# Patient Record
Sex: Female | Born: 1955
Health system: Southern US, Community
[De-identification: ages and names within clinical notes are randomized; demographics above are authoritative.]

## PROBLEM LIST (undated history)

## (undated) DIAGNOSIS — F319 Bipolar disorder, unspecified: Secondary | ICD-10-CM

## (undated) DIAGNOSIS — J449 Chronic obstructive pulmonary disease, unspecified: Secondary | ICD-10-CM

## (undated) DIAGNOSIS — D649 Anemia, unspecified: Secondary | ICD-10-CM

## (undated) DIAGNOSIS — K222 Esophageal obstruction: Secondary | ICD-10-CM

## (undated) DIAGNOSIS — K449 Diaphragmatic hernia without obstruction or gangrene: Secondary | ICD-10-CM

## (undated) DIAGNOSIS — Z9889 Other specified postprocedural states: Secondary | ICD-10-CM

## (undated) HISTORY — DX: Diaphragmatic hernia without obstruction or gangrene: K44.9

## (undated) HISTORY — DX: Anemia, unspecified: D64.9

## (undated) HISTORY — DX: Esophageal obstruction: K22.2

## (undated) HISTORY — DX: Chronic obstructive pulmonary disease, unspecified: J44.9

## (undated) HISTORY — DX: Bipolar disorder, unspecified: F31.9

## (undated) HISTORY — PX: TUBAL LIGATION: SHX77

## (undated) HISTORY — DX: Other specified postprocedural states: Z98.890

---

## 2000-11-16 DIAGNOSIS — Z9889 Other specified postprocedural states: Secondary | ICD-10-CM

## 2000-11-16 HISTORY — DX: Other specified postprocedural states: Z98.890

## 2005-01-14 HISTORY — PX: TOTAL ABDOMINAL HYSTERECTOMY W/ BILATERAL SALPINGOOPHORECTOMY: SHX83

## 2005-02-03 ENCOUNTER — Emergency Department (HOSPITAL_COMMUNITY): Admission: EM | Admit: 2005-02-03 | Discharge: 2005-02-03 | Payer: Self-pay | Admitting: Emergency Medicine

## 2005-04-09 ENCOUNTER — Emergency Department (HOSPITAL_COMMUNITY): Admission: EM | Admit: 2005-04-09 | Discharge: 2005-04-10 | Payer: Self-pay | Admitting: *Deleted

## 2006-03-12 ENCOUNTER — Ambulatory Visit: Payer: Self-pay | Admitting: Internal Medicine

## 2006-03-31 ENCOUNTER — Ambulatory Visit: Payer: Self-pay | Admitting: Family Medicine

## 2006-04-14 ENCOUNTER — Ambulatory Visit: Payer: Self-pay | Admitting: Internal Medicine

## 2006-04-14 ENCOUNTER — Ambulatory Visit (HOSPITAL_COMMUNITY): Admission: RE | Admit: 2006-04-14 | Discharge: 2006-04-14 | Payer: Self-pay | Admitting: Internal Medicine

## 2006-04-15 ENCOUNTER — Ambulatory Visit: Payer: Self-pay | Admitting: Internal Medicine

## 2006-04-27 ENCOUNTER — Ambulatory Visit: Payer: Self-pay | Admitting: Family Medicine

## 2006-04-27 ENCOUNTER — Encounter (INDEPENDENT_AMBULATORY_CARE_PROVIDER_SITE_OTHER): Payer: Self-pay | Admitting: Internal Medicine

## 2006-04-28 ENCOUNTER — Ambulatory Visit (HOSPITAL_COMMUNITY): Admission: RE | Admit: 2006-04-28 | Discharge: 2006-04-28 | Payer: Self-pay | Admitting: Internal Medicine

## 2006-04-28 ENCOUNTER — Encounter (INDEPENDENT_AMBULATORY_CARE_PROVIDER_SITE_OTHER): Payer: Self-pay | Admitting: Internal Medicine

## 2006-04-29 ENCOUNTER — Ambulatory Visit: Payer: Self-pay | Admitting: Internal Medicine

## 2006-05-12 ENCOUNTER — Encounter (INDEPENDENT_AMBULATORY_CARE_PROVIDER_SITE_OTHER): Payer: Self-pay | Admitting: Internal Medicine

## 2006-05-25 ENCOUNTER — Ambulatory Visit: Payer: Self-pay | Admitting: Internal Medicine

## 2006-06-01 ENCOUNTER — Ambulatory Visit: Payer: Self-pay | Admitting: Internal Medicine

## 2006-06-29 ENCOUNTER — Ambulatory Visit: Payer: Self-pay | Admitting: Internal Medicine

## 2006-07-21 ENCOUNTER — Encounter (INDEPENDENT_AMBULATORY_CARE_PROVIDER_SITE_OTHER): Payer: Self-pay | Admitting: Internal Medicine

## 2006-08-09 ENCOUNTER — Encounter (INDEPENDENT_AMBULATORY_CARE_PROVIDER_SITE_OTHER): Payer: Self-pay | Admitting: Internal Medicine

## 2006-08-29 ENCOUNTER — Emergency Department (HOSPITAL_COMMUNITY): Admission: EM | Admit: 2006-08-29 | Discharge: 2006-08-29 | Payer: Self-pay | Admitting: *Deleted

## 2006-09-22 ENCOUNTER — Encounter: Payer: Self-pay | Admitting: Family Medicine

## 2006-12-21 ENCOUNTER — Encounter: Payer: Self-pay | Admitting: Internal Medicine

## 2006-12-21 DIAGNOSIS — K449 Diaphragmatic hernia without obstruction or gangrene: Secondary | ICD-10-CM | POA: Insufficient documentation

## 2006-12-21 DIAGNOSIS — K222 Esophageal obstruction: Secondary | ICD-10-CM

## 2006-12-21 DIAGNOSIS — D649 Anemia, unspecified: Secondary | ICD-10-CM | POA: Insufficient documentation

## 2007-01-22 ENCOUNTER — Emergency Department (HOSPITAL_COMMUNITY): Admission: EM | Admit: 2007-01-22 | Discharge: 2007-01-22 | Payer: Self-pay | Admitting: Emergency Medicine

## 2007-01-24 ENCOUNTER — Ambulatory Visit: Payer: Self-pay | Admitting: Internal Medicine

## 2007-01-24 DIAGNOSIS — M940 Chondrocostal junction syndrome [Tietze]: Secondary | ICD-10-CM | POA: Insufficient documentation

## 2007-01-24 DIAGNOSIS — N6019 Diffuse cystic mastopathy of unspecified breast: Secondary | ICD-10-CM

## 2007-06-15 ENCOUNTER — Ambulatory Visit: Payer: Self-pay | Admitting: Internal Medicine

## 2007-06-15 ENCOUNTER — Ambulatory Visit (HOSPITAL_COMMUNITY): Admission: RE | Admit: 2007-06-15 | Discharge: 2007-06-15 | Payer: Self-pay | Admitting: Internal Medicine

## 2007-06-15 DIAGNOSIS — F17200 Nicotine dependence, unspecified, uncomplicated: Secondary | ICD-10-CM

## 2007-06-15 DIAGNOSIS — R109 Unspecified abdominal pain: Secondary | ICD-10-CM | POA: Insufficient documentation

## 2007-06-16 ENCOUNTER — Encounter (INDEPENDENT_AMBULATORY_CARE_PROVIDER_SITE_OTHER): Payer: Self-pay | Admitting: Internal Medicine

## 2007-06-16 ENCOUNTER — Telehealth (INDEPENDENT_AMBULATORY_CARE_PROVIDER_SITE_OTHER): Payer: Self-pay | Admitting: *Deleted

## 2007-06-17 ENCOUNTER — Encounter (INDEPENDENT_AMBULATORY_CARE_PROVIDER_SITE_OTHER): Payer: Self-pay | Admitting: Internal Medicine

## 2007-06-17 LAB — CONVERTED CEMR LAB
ALT: 17 units/L (ref 0–35)
AST: 20 units/L (ref 0–37)
Albumin: 4.1 g/dL (ref 3.5–5.2)
Alkaline Phosphatase: 53 units/L (ref 39–117)
Basophils Absolute: 0 10*3/uL (ref 0.0–0.1)
Basophils Relative: 0 % (ref 0–1)
Calcium: 9.3 mg/dL (ref 8.4–10.5)
Chloride: 104 meq/L (ref 96–112)
Eosinophils Absolute: 0.2 10*3/uL (ref 0.0–0.7)
MCHC: 32 g/dL (ref 30.0–36.0)
MCV: 96.7 fL (ref 78.0–100.0)
Neutro Abs: 2.1 10*3/uL (ref 1.7–7.7)
Neutrophils Relative %: 39 % — ABNORMAL LOW (ref 43–77)
Platelets: 183 10*3/uL (ref 150–400)
Potassium: 3.6 meq/L (ref 3.5–5.3)
RDW: 13.5 % (ref 11.5–14.0)
TSH: 2.574 microintl units/mL (ref 0.350–5.50)

## 2007-06-20 ENCOUNTER — Telehealth (INDEPENDENT_AMBULATORY_CARE_PROVIDER_SITE_OTHER): Payer: Self-pay | Admitting: *Deleted

## 2007-06-27 ENCOUNTER — Telehealth (INDEPENDENT_AMBULATORY_CARE_PROVIDER_SITE_OTHER): Payer: Self-pay | Admitting: *Deleted

## 2007-07-01 ENCOUNTER — Ambulatory Visit: Payer: Self-pay | Admitting: Gastroenterology

## 2007-07-19 ENCOUNTER — Ambulatory Visit: Payer: Self-pay | Admitting: Gastroenterology

## 2007-07-19 ENCOUNTER — Ambulatory Visit (HOSPITAL_COMMUNITY): Admission: RE | Admit: 2007-07-19 | Discharge: 2007-07-19 | Payer: Self-pay | Admitting: Gastroenterology

## 2007-07-19 HISTORY — PX: ESOPHAGOGASTRODUODENOSCOPY: SHX1529

## 2007-07-20 ENCOUNTER — Ambulatory Visit (HOSPITAL_COMMUNITY): Admission: RE | Admit: 2007-07-20 | Discharge: 2007-07-20 | Payer: Self-pay | Admitting: Gastroenterology

## 2007-09-07 ENCOUNTER — Ambulatory Visit: Payer: Self-pay | Admitting: Gastroenterology

## 2007-09-07 ENCOUNTER — Encounter (INDEPENDENT_AMBULATORY_CARE_PROVIDER_SITE_OTHER): Payer: Self-pay | Admitting: Internal Medicine

## 2007-09-27 ENCOUNTER — Encounter (INDEPENDENT_AMBULATORY_CARE_PROVIDER_SITE_OTHER): Payer: Self-pay | Admitting: Internal Medicine

## 2007-10-06 ENCOUNTER — Encounter (INDEPENDENT_AMBULATORY_CARE_PROVIDER_SITE_OTHER): Payer: Self-pay | Admitting: Internal Medicine

## 2007-10-06 LAB — HM MAMMOGRAPHY

## 2008-02-22 ENCOUNTER — Telehealth (INDEPENDENT_AMBULATORY_CARE_PROVIDER_SITE_OTHER): Payer: Self-pay | Admitting: *Deleted

## 2008-07-10 ENCOUNTER — Ambulatory Visit: Payer: Self-pay | Admitting: Internal Medicine

## 2008-07-10 DIAGNOSIS — N952 Postmenopausal atrophic vaginitis: Secondary | ICD-10-CM

## 2008-07-10 DIAGNOSIS — B070 Plantar wart: Secondary | ICD-10-CM

## 2008-07-11 LAB — CONVERTED CEMR LAB
CO2: 25 meq/L (ref 19–32)
Calcium: 9.4 mg/dL (ref 8.4–10.5)
Chloride: 103 meq/L (ref 96–112)
Creatinine, Ser: 0.93 mg/dL (ref 0.40–1.20)
Glucose, Bld: 97 mg/dL (ref 70–99)
HDL: 58 mg/dL (ref >39)
LDL Cholesterol: 88 mg/dL (ref 0–99)
Sodium: 141 meq/L (ref 135–145)
Total CHOL/HDL Ratio: 2.8

## 2008-07-26 ENCOUNTER — Encounter (INDEPENDENT_AMBULATORY_CARE_PROVIDER_SITE_OTHER): Payer: Self-pay | Admitting: Internal Medicine

## 2008-07-27 ENCOUNTER — Ambulatory Visit: Payer: Self-pay | Admitting: Internal Medicine

## 2008-07-27 DIAGNOSIS — M65839 Other synovitis and tenosynovitis, unspecified forearm: Secondary | ICD-10-CM

## 2008-07-27 DIAGNOSIS — M65849 Other synovitis and tenosynovitis, unspecified hand: Secondary | ICD-10-CM

## 2008-08-13 ENCOUNTER — Encounter: Payer: Self-pay | Admitting: Gastroenterology

## 2008-08-13 ENCOUNTER — Encounter (INDEPENDENT_AMBULATORY_CARE_PROVIDER_SITE_OTHER): Payer: Self-pay | Admitting: Internal Medicine

## 2008-08-13 ENCOUNTER — Ambulatory Visit: Payer: Self-pay | Admitting: Gastroenterology

## 2008-08-13 ENCOUNTER — Ambulatory Visit (HOSPITAL_COMMUNITY): Admission: RE | Admit: 2008-08-13 | Discharge: 2008-08-13 | Payer: Self-pay | Admitting: Gastroenterology

## 2008-08-13 HISTORY — PX: COLONOSCOPY: SHX174

## 2008-08-13 LAB — HM COLONOSCOPY

## 2008-08-17 ENCOUNTER — Ambulatory Visit: Payer: Self-pay | Admitting: Internal Medicine

## 2008-10-03 ENCOUNTER — Encounter (INDEPENDENT_AMBULATORY_CARE_PROVIDER_SITE_OTHER): Payer: Self-pay | Admitting: Internal Medicine

## 2008-11-16 DIAGNOSIS — F319 Bipolar disorder, unspecified: Secondary | ICD-10-CM

## 2008-11-16 HISTORY — DX: Bipolar disorder, unspecified: F31.9

## 2008-11-23 ENCOUNTER — Ambulatory Visit: Payer: Self-pay | Admitting: Internal Medicine

## 2008-11-23 DIAGNOSIS — M6530 Trigger finger, unspecified finger: Secondary | ICD-10-CM | POA: Insufficient documentation

## 2008-11-27 ENCOUNTER — Telehealth (INDEPENDENT_AMBULATORY_CARE_PROVIDER_SITE_OTHER): Payer: Self-pay | Admitting: *Deleted

## 2009-03-08 ENCOUNTER — Telehealth (INDEPENDENT_AMBULATORY_CARE_PROVIDER_SITE_OTHER): Payer: Self-pay | Admitting: Internal Medicine

## 2009-08-04 ENCOUNTER — Emergency Department (HOSPITAL_COMMUNITY): Admission: EM | Admit: 2009-08-04 | Discharge: 2009-08-04 | Payer: Self-pay | Admitting: Emergency Medicine

## 2009-08-06 ENCOUNTER — Encounter (INDEPENDENT_AMBULATORY_CARE_PROVIDER_SITE_OTHER): Payer: Self-pay | Admitting: Internal Medicine

## 2009-08-14 ENCOUNTER — Ambulatory Visit: Payer: Self-pay | Admitting: Family Medicine

## 2009-08-15 ENCOUNTER — Encounter: Payer: Self-pay | Admitting: Family Medicine

## 2009-08-15 ENCOUNTER — Ambulatory Visit (HOSPITAL_COMMUNITY): Admission: RE | Admit: 2009-08-15 | Discharge: 2009-08-15 | Payer: Self-pay | Admitting: Family Medicine

## 2009-08-18 DIAGNOSIS — K219 Gastro-esophageal reflux disease without esophagitis: Secondary | ICD-10-CM

## 2009-08-19 ENCOUNTER — Encounter: Payer: Self-pay | Admitting: Family Medicine

## 2009-08-19 LAB — CONVERTED CEMR LAB
CO2: 25 meq/L (ref 19–32)
Chloride: 105 meq/L (ref 96–112)
Creatinine, Ser: 0.95 mg/dL (ref 0.40–1.20)
HDL: 48 mg/dL (ref >39)
LDL Cholesterol: 96 mg/dL (ref 0–99)
Lymphocytes Relative: 44 % (ref 12–46)
Lymphs Abs: 2.1 10*3/uL (ref 0.7–4.0)
MCV: 96.8 fL (ref 78.0–100.0)
Monocytes Relative: 9 % (ref 3–12)
Neutro Abs: 2 10*3/uL (ref 1.7–7.7)
Neutrophils Relative %: 42 % — ABNORMAL LOW (ref 43–77)
Platelets: 191 10*3/uL (ref 150–400)
Potassium: 4 meq/L (ref 3.5–5.3)
RBC: 4.63 M/uL (ref 3.87–5.11)
Sodium: 142 meq/L (ref 135–145)
TSH: 2.336 microintl units/mL (ref 0.350–4.500)
Total CHOL/HDL Ratio: 3.2
Triglycerides: 50 mg/dL (ref <150)
VLDL: 10 mg/dL (ref 0–40)
WBC: 4.8 10*3/uL (ref 4.0–10.5)

## 2009-12-13 ENCOUNTER — Ambulatory Visit: Payer: Self-pay | Admitting: Family Medicine

## 2009-12-13 DIAGNOSIS — K3189 Other diseases of stomach and duodenum: Secondary | ICD-10-CM

## 2009-12-13 DIAGNOSIS — R1013 Epigastric pain: Secondary | ICD-10-CM

## 2009-12-20 ENCOUNTER — Ambulatory Visit (HOSPITAL_COMMUNITY): Admission: RE | Admit: 2009-12-20 | Discharge: 2009-12-20 | Payer: Self-pay | Admitting: Family Medicine

## 2009-12-23 ENCOUNTER — Telehealth: Payer: Self-pay | Admitting: Family Medicine

## 2009-12-26 ENCOUNTER — Encounter: Payer: Self-pay | Admitting: Family Medicine

## 2010-01-10 ENCOUNTER — Encounter (HOSPITAL_COMMUNITY): Admission: RE | Admit: 2010-01-10 | Discharge: 2010-02-09 | Payer: Self-pay | Admitting: Family Medicine

## 2010-10-17 ENCOUNTER — Ambulatory Visit: Payer: Self-pay | Admitting: Family Medicine

## 2010-10-17 ENCOUNTER — Other Ambulatory Visit
Admission: RE | Admit: 2010-10-17 | Discharge: 2010-10-17 | Payer: Self-pay | Source: Home / Self Care | Admitting: Family Medicine

## 2010-10-17 DIAGNOSIS — R413 Other amnesia: Secondary | ICD-10-CM

## 2010-10-18 DIAGNOSIS — E663 Overweight: Secondary | ICD-10-CM

## 2010-10-18 DIAGNOSIS — E669 Obesity, unspecified: Secondary | ICD-10-CM | POA: Insufficient documentation

## 2010-10-21 LAB — CONVERTED CEMR LAB
BUN: 11 mg/dL (ref 6–23)
Basophils Absolute: 0 10*3/uL (ref 0.0–0.1)
Basophils Relative: 0 % (ref 0–1)
Cholesterol: 156 mg/dL (ref 0–200)
Creatinine, Ser: 0.78 mg/dL (ref 0.40–1.20)
Eosinophils Absolute: 0.2 10*3/uL (ref 0.0–0.7)
HDL: 54 mg/dL (ref >39)
MCHC: 32.9 g/dL (ref 30.0–36.0)
MCV: 92.7 fL (ref 78.0–100.0)
Monocytes Relative: 7 % (ref 3–12)
Neutrophils Relative %: 53 % (ref 43–77)
Potassium: 4 meq/L (ref 3.5–5.3)
RBC: 4.39 M/uL (ref 3.87–5.11)
RDW: 13.2 % (ref 11.5–15.5)
Triglycerides: 26 mg/dL (ref <150)
VLDL: 5 mg/dL (ref 0–40)

## 2010-10-22 ENCOUNTER — Ambulatory Visit (HOSPITAL_COMMUNITY)
Admission: RE | Admit: 2010-10-22 | Discharge: 2010-10-22 | Payer: Self-pay | Source: Home / Self Care | Attending: Family Medicine | Admitting: Family Medicine

## 2010-10-23 ENCOUNTER — Telehealth: Payer: Self-pay | Admitting: Family Medicine

## 2010-10-23 LAB — CONVERTED CEMR LAB: Pap Smear: NEGATIVE

## 2010-11-25 ENCOUNTER — Telehealth: Payer: Self-pay | Admitting: Family Medicine

## 2010-12-16 NOTE — Progress Notes (Signed)
  Phone Note From Pharmacy   Caller: kmart Summary of Call: patient cannot afford nexium can we try omeprazole? Initial call taken by: Worthy Keeler LPN,  December 23, 2009 3:27 PM  Follow-up for Phone Call        Change Rx to Omeprazole 20 mg #30 1 daily #90 x 3 refills. Follow-up by: Esperanza Sheets PA,  December 23, 2009 4:16 PM  Additional Follow-up for Phone Call Additional follow up Details #1::        sent rx Additional Follow-up by: Worthy Keeler LPN,  December 23, 2009 4:36 PM    New/Updated Medications: OMEPRAZOLE 20 MG CPDR (OMEPRAZOLE) one cap by mouth once daily  stop nexium Prescriptions: OMEPRAZOLE 20 MG CPDR (OMEPRAZOLE) one cap by mouth once daily  stop nexium  #30 x 1   Entered by:   Worthy Keeler LPN   Authorized by:   Syliva Overman MD   Signed by:   Worthy Keeler LPN on 04/54/0981   Method used:   Electronically to        [facility] [address] [phone]* (retail)       [address], [place]  [address]       Ph: [phone]       Fax: [phone]   RxID:   [id number]

## 2010-12-16 NOTE — Assessment & Plan Note (Signed)
Summary: phy   Vital Signs:  Patient profile:   55 year old female Menstrual status:  hysterectomy Height:      66 inches Weight:      182.50 pounds BMI:     29.56 O2 Sat:      99 % on Room air Pulse rate:   68 / minute Pulse rhythm:   regular Resp:     16 per minute BP sitting:   130 / 90  (left arm)  Vitals Entered By: Adella Hare LPN (October 17, 2010 9:01 AM)  Nutrition Counseling: Patient's BMI is greater than 25 and therefore counseled on weight management options.  O2 Flow:  Room air CC: physical Is Patient Diabetic? No Pain Assessment Patient in pain? no      Comments did not bring meds to ov but reviewed med list   Primary Care Virjean Boman:  Syliva Overman MD  CC:  physical.  History of Present Illness: Reports  that she has not been doing well.She continues to have significant menmtal health issues. She also is very concerned about deterioration in her memory, c/0 increased forgetfullness.She denies getting lost while driving, but is concerned about poor focus, concentrationand memory disturbance. She has concerns about possible dementia early onset. Denies recent fever or chills. Denies sinus pressure, nasal congestion , ear pain or sore throat. Denies chest congestion, or cough productive of sputum. Denies chest pain, palpitations, PND, orthopnea or leg swelling. Denies abdominal pain, nausea, vomitting, diarrhea or constipation. Denies change in bowel movements or bloody stool. Denies dysuria , frequency, incontinence or hesitancy. Denies  joint pain, swelling, or reduced mobility. Denies headaches, vertigo, seizures. she reoprts chronic depression, she has a good home environment , however states that her coworkers stress  her , she has anger issues, denies suicidal or homicidal feelings, but does state that Denies  rash, lesions, or itch. she still smokes, no change in amt, but does know she needs to quit     Preventive Screening-Counseling &  Management  Alcohol-Tobacco     Smoking Cessation Counseling: yes  Current Medications (verified): 1)  Nexium 40 Mg  Cpdr (Esomeprazole Magnesium) .Marland Kitchen.. 1 By Mouth Two Times A Day 2)  Aspirin 81 Mg Tbec (Aspirin) .... One Tab By Mouth Qd 3)  Oscal 500/200 D-3 500-200 Mg-Unit Tabs (Calcium-Vitamin D) .... One Tab By Mouth Three Times A Day 4)  Seroquel Xr 150 Mg Xr24h-Tab (Quetiapine Fumarate) .... Take 1 Tablet By Mouth Once A Day 5)  Aleve 220 Mg Tabs (Naproxen Sodium) .... As Needed  Allergies (verified): 1)  ! Codeine  Review of Systems      See HPI Eyes:  Denies blurring, discharge, eye pain, and red eye. Neuro:  Complains of memory loss. Psych:  Complains of anxiety, depression, irritability, and mental problems; denies suicidal thoughts/plans, thoughts of violence, and unusual visions or sounds. Endo:  Complains of weight change; denies cold intolerance, excessive hunger, excessive thirst, and excessive urination; weight gain. Heme:  Denies abnormal bruising and bleeding. Allergy:  Denies hives or rash and itching eyes.  Physical Exam  General:  Well-developedobesein no acute distress; alert,appropriate and cooperative throughout examination Head:  Normocephalic and atraumatic without obvious abnormalities. No apparent alopecia or balding. Eyes:  No corneal or conjunctival inflammation noted. EOMI. Perrla. Funduscopic exam benign, without hemorrhagesorexudates . Vision grossly normal. Ears:  External ear exam shows no significant lesions or deformities.  Otoscopic examination reveals clear canals, tympanic membranes are intact bilaterally without bulging, retraction, inflammation or  discharge. Hearing is grossly normal bilaterally. Nose:  External nasal examination shows no deformity or inflammation. Nasal mucosa are pink and moist without lesions or exudates. Mouth:  Oral mucosa and oropharynx without lesions or exudates.  Teeth in good repair. Neck:  No deformities, masses, or  tenderness noted. Chest Wall:  No deformities, masses, or tenderness noted. Breasts:  No mass, nodules, thickening, tenderness, bulging, retraction, inflamation, nipple discharge or skin changes noted.   Lungs:  Normal respiratory effort, chest expands symmetrically. Lungs are clear to auscultation, no crackles or wheezes. Heart:  Normal rate and regular rhythm. S1 and S2 normal without gallop, murmur, click, rub or other extra sounds. Abdomen:  Bowel sounds positive,abdomen soft and non-tender without masses, organomegaly or hernias noted. Rectal:  No external abnormalities noted. Normal sphincter tone. No rectal masses or tenderness. Genitalia:  no external lesions, no vaginal or cervical lesions, no vaginal atrophy, and no adnexal masses or tenderness. Uterus absent  Msk:  No deformity or scoliosis noted of thoracic or lumbar spine.   Pulses:  R and L carotid,radial,femoral,dorsalis pedis and posterior tibial pulses are full and equal bilaterally Extremities:  No clubbing, cyanosis, edema, or deformity noted with normal full range of motion of all joints.   Neurologic:  No cranial nerve deficits noted. Station and gait are normal. Plantar reflexes are down-going bilaterally. DTRs are symmetrical throughout. Sensory, motor and coordinative functions appear intact. Skin:  Intact without suspicious lesions or rashes Cervical Nodes:  No lymphadenopathy noted Axillary Nodes:  No palpable lymphadenopathy Inguinal Nodes:  No significant adenopathy Psych:  Oriented X3, memory intact for recent and remote, normally interactive, good eye contact, slightly anxious, and judgment fair.     Impression & Recommendations:  Problem # 1:  PHYSICAL EXAMINATION (ICD-V70.0) Assessment Comment Only  Orders: T-Basic Metabolic Panel [phone]) T-Lipid Profile [phone]) T-TSH [phone]) pap sent. Rectal exm reveals guaic negative stool therapeutic lifestyle change discussed and encouraged seat  belt use reiterated  Problem # 2:  TOBACCO USER (ICD-305.1) Assessment: Unchanged  Encouraged smoking cessation and discussed different methods for smoking cessation.   Problem # 3:  MEMORY LOSS (ICD-780.93) Assessment: Comment Only referred for brain scan to further address this concern  Problem # 4:  DEPRESSION (ICD-311) Assessment: Deteriorated will consider either increasing current med or adding additional, pt previously labelled as bipolar, howerver i am not convinced of this based on mental health questionaire  Problem # 5:  OVERWEIGHT (ICD-278.02) Assessment: Deteriorated  Ht: 66 (10/17/2010)   Wt: 182.50 (10/17/2010)   BMI: 29.56 (10/17/2010) therapeutic lifestyle change discussed and encouraged  Complete Medication List: 1)  Nexium 40 Mg Cpdr (Esomeprazole magnesium) .Marland Kitchen.. 1 by mouth two times a day 2)  Aspirin 81 Mg Tbec (Aspirin) .... One tab by mouth qd 3)  Oscal 500/200 D-3 500-200 Mg-unit Tabs (Calcium-vitamin d) .... One tab by mouth three times a day 4)  Seroquel Xr 150 Mg Xr24h-tab (Quetiapine fumarate) .... Take 1 tablet by mouth once a day 5)  Aleve 220 Mg Tabs (Naproxen sodium) .... As needed  Other Orders: T-CBC w/Diff [phone]) Radiology Referral (Radiology) Hemoccult Guaiac-1 spec.(in office) (82270) Pap Smear (40102)  Patient Instructions: 1)  Please schedule a follow-up appointment in 3 months. 2)  You need fasting labs asap 3)  you will be referred for a brain scan beause of memory loss. 4)  i will call back if your med for mental health is changed or increased, continue what you are taking now pls. 5)  Tobacco  is very bad for your health and your loved ones! You Should stop smoking!. 6)  Stop Smoking Tips: Choose a Quit date. Cut down before the Quit date. decide what you will do as a substitute when you feel the urge to smoke(gum,toothpick,exercise). 7)  It is important that you exercise regularly at least 20 minutes 5 times a week. If you  develop chest pain, have severe difficulty breathing, or feel very tired , stop exercising immediately and seek medical attention. 8)  You need to lose weight. Consider a lower calorie diet and regular exercise.  Prescriptions: SEROQUEL XR 150 MG XR24H-TAB (QUETIAPINE FUMARATE) Take 1 tablet by mouth once a day  #30 x 3   Entered by:   Adella Hare LPN   Authorized by:   Syliva Overman MD   Signed by:   Adella Hare LPN on 73/22/0254   Method used:   Electronically to        [facility] [address] [phone]* (retail)       [address], [place]  [address]       Ph: [phone]       Fax: [phone]   RxID:   [id number] NEXIUM 40 MG  CPDR (ESOMEPRAZOLE MAGNESIUM) 1 by mouth two times a day  #60 x 3   Entered by:   Adella Hare LPN   Authorized by:   Syliva Overman MD   Signed by:   Adella Hare LPN on 99/37/1696   Method used:   Electronically to        [facility] [phone]* (retail)       [address] [place], [place]  [address]       Ph: [phone]       Fax: [phone]   RxID:   [id number]    Orders Added: 1)  Est. Patient 40-64 years [99396] 2)  T-Basic Metabolic Panel [phone] 3)  T-CBC w/Diff [[id number]] 4)  T-Lipid Profile [[id number]] 5)  T-TSH [[id number]] 6)  Radiology Referral [Radiology] 7)  Hemoccult Guaiac-1 spec.(in office) [82270] 8)  Pap Smear [88150]    Laboratory Results  Date/Time Received: October 17, 2010 10:36 AM  Date/Time Reported: October 17, 2010 10:36 AM   Stool - Occult Blood Hemmoccult #1: negative Date: 10/17/2010 Comments: 118 10/12 51301 13L 10/13 Adella Hare LPN  October 17, 2010 10:37 AM

## 2010-12-16 NOTE — Letter (Addendum)
Summary: referral for hida  referral for hida   Imported By: Rudene Anda 12/26/2009 12:31:07  _____________________________________________________________________  External Attachments:     1. Type:   Image          Comment:   External Document    2. Type:   Image          Comment:   External Document

## 2010-12-16 NOTE — Progress Notes (Signed)
Summary: MEDICATION   Phone Note Call from Patient   Summary of Call: PER PATIENT REQUEST HER INSURANCE DO NOT USE [facility] PLEASE SEND TO [facility] Initial call taken by: Eugenio Hoes,  October 23, 2010 9:01 AM    Prescriptions: SEROQUEL XR 150 MG XR24H-TAB (QUETIAPINE FUMARATE) Take 1 tablet by mouth once a day  #30 x 3   Entered by:   Adella Hare LPN   Authorized by:   Syliva Overman MD   Signed by:   Adella Hare LPN on 11/91/4782   Method used:   Electronically to        [facility] [address] (retail)       [address]       [place], [place]  [address]       Ph: [phone]       Fax: [phone]   RxID:   [id number] NEXIUM 40 MG  CPDR (ESOMEPRAZOLE MAGNESIUM) 1 by mouth two times a day  #60 x 3   Entered by:   Adella Hare LPN   Authorized by:   Syliva Overman MD   Signed by:   Adella Hare LPN on 47/42/5956   Method used:   Electronically to        [facility] (retail)       [address]       [place], [place]  [address]       Ph: [phone]       Fax: [phone]   RxID:   [id number]

## 2010-12-16 NOTE — Assessment & Plan Note (Signed)
Summary: OV   Vital Signs:  Patient profile:   55 year old female Menstrual status:  hysterectomy Height:      66 inches Weight:      165.25 pounds BMI:     26.77 O2 Sat:      94 % Pulse rate:   74 / minute Pulse rhythm:   regular Resp:     16 per minute BP sitting:   118 / 80  Vitals Entered By: Everitt Amber (December 13, 2009 11:24 AM)  Nutrition Counseling: Patient's BMI is greater than 25 and therefore counseled on weight management options. CC: she has been getting hot flashes lately and when she gets them she describes it as a prickling sensation in her back    Primary Care Provider:  Syliva Overman MD  CC:  she has been getting hot flashes lately and when she gets them she describes it as a prickling sensation in her back .  History of Present Illness: Reports  that she has been doing better than at the last visit. She reports improvement in her mood with seroquel , but does admit there uis room for continuied improvement. Denies recent fever or chills. Denies sinus pressure, nasal congestion , ear pain or sore throat. Denies chest congestion, or cough productive of sputum. Denies chest pain, palpitations, PND, orthopnea or leg swelling.   Denies change in bowel movements or bloody stool. Denies dysuria , frequency, incontinence or hesitancy. Denies  joint pain, swelling, or reduced mobility. Denies headaches, vertigo, seizures.  Denies  rash, lesions, or itch. she is experiencing hot flashes, which she states are accompanied by tingling sensations .     Preventive Screening-Counseling & Management  Alcohol-Tobacco     Smoking Cessation Counseling: yes     Packs/Day: 0.5  Current Medications (verified): 1)  Nexium 40 Mg  Cpdr (Esomeprazole Magnesium) .Marland Kitchen.. 1 By Mouth Two Times A Day 2)  Seroquel Xr 50 Mg Xr24h-Tab (Quetiapine Fumarate) .Marland Kitchen.. 1 By Mouth Q Am After Work 3)  Seroquel Xr 50 Mg Xr24h-Tab (Quetiapine Fumarate) .... One Tab By Mouth Qd 4)  Aspirin  81 Mg Tbec (Aspirin) .... One Tab By Mouth Qd 5)  Oscal 500/200 D-3 500-200 Mg-Unit Tabs (Calcium-Vitamin D) .... One Tab By Mouth Three Times A Day  Allergies (verified): 1)  ! Codeine  Social History: Packs/Day:  0.5  Review of Systems      See HPI Eyes:  Denies blurring, discharge, eye pain, and red eye. GI:  Complains of abdominal pain, indigestion, and nausea; denies change in bowel habits, constipation, diarrhea, and vomiting; RUQ pain, bloating and belching. Neuro:  Denies headaches, seizures, and sensation of room spinning. Psych:  Complains of anxiety, depression, and mental problems; denies panic attacks, sense of great danger, unusual visions or sounds, and thoughts /plans of harming others; rports 30% improvement in symptoms, has opted not to see psychiatry. Endo:  Denies cold intolerance, excessive hunger, excessive thirst, excessive urination, heat intolerance, polyuria, and weight change. Heme:  Denies abnormal bruising and bleeding. Allergy:  Complains of seasonal allergies; mild.  Physical Exam  General:  Well-developed,well-nourished,in no acute distress; alert,appropriate and cooperative throughout examination HEENT: No facial asymmetry,  EOMI, No sinus tenderness, TM's Clear, oropharynx  pink and moist.   Chest: Clear to auscultation bilaterally.  CVS: S1, S2, No murmurs, No S3.   Abd: Soft, Nontender.  MS: Adequate ROM spine, hips, shoulders and knees.  Ext: No edema.   CNS: CN 2-12 intact, power tone and  sensation normal throughout.   Skin: Intact, no visible lesions or rashes.  Psych: Good eye contact, normal affect.  Memory intact, not anxious or depressed appearing.    Impression & Recommendations:  Problem # 1:  DYSPEPSIA (ICD-536.8) Assessment Deteriorated  Orders: Radiology Referral (Radiology)  Problem # 2:  GERD (ICD-530.81) Assessment: Deteriorated  Her updated medication list for this problem includes:    Nexium 40 Mg Cpdr (Esomeprazole  magnesium) .Marland Kitchen... 1 by mouth two times a day  Problem # 3:  DISORDER, BIPOLAR NOS (ICD-296.80) Assessment: Improved pt to have dose inc in the seroquel to std treating dose  Problem # 4:  TOBACCO USER (ICD-305.1) Assessment: Unchanged  Encouraged smoking cessation and discussed different methods for smoking cessation.   Complete Medication List: 1)  Nexium 40 Mg Cpdr (Esomeprazole magnesium) .Marland Kitchen.. 1 by mouth two times a day 2)  Aspirin 81 Mg Tbec (Aspirin) .... One tab by mouth qd 3)  Oscal 500/200 D-3 500-200 Mg-unit Tabs (Calcium-vitamin d) .... One tab by mouth three times a day 4)  Seroquel Xr 150 Mg Xr24h-tab (Quetiapine fumarate) .... Take 1 tablet by mouth once a day  Patient Instructions: 1)  Please schedule a follow-up appointment in 3 months. 2)  It is important that you exercise regularly at least 20 minutes 5 times a week. If you develop chest pain, have severe difficulty breathing, or feel very tired , stop exercising immediately and seek medical attention. 3)  You need to lose weight. Consider a lower calorie diet and regular exercise.  4)  Tobacco is very bad for your health and your loved ones! You Should stop smoking!. 5)  Stop Smoking Tips: Choose a Quit date. Cut down before the Quit date. decide what you will do as a substitute when you feel the urge to smoke(gum,toothpick,exercise). 6)  You will be referred fpor an [place] of youll bladder and further eval if needed Prescriptions: SEROQUEL XR 150 MG XR24H-TAB (QUETIAPINE FUMARATE) Take 1 tablet by mouth once a day  #30 x 3   Entered and Authorized by:   Syliva Overman MD   Signed by:   Syliva Overman MD on 12/13/2009   Method used:   Electronically to        [facility] [address] [phone]* (retail)       [address] [place]  [address]       Ph: [phone]       Fax: [phone]   RxID:   [id number]

## 2010-12-18 NOTE — Progress Notes (Signed)
Summary: medication  Phone Note Call from Patient   Summary of Call: patient called state that she can not afford medication pharmacy is  wanted to know could you called in something else [facility] Initial call taken by: Eugenio Hoes,  November 25, 2010 10:58 AM  Follow-up for Phone Call        NEEDS ALTERNATIVE TO NEXIUM, PATIENT STATES SHE CANNOT AFFORD NEXIUM   WANTS MED TO GO TO [facility]. Follow-up by: Adella Hare LPN,  November 25, 2010 11:30 AM  Additional Follow-up for Phone Call Additional follow up Details #1::        padvise pharmacy and pt then fax historic pantoprazole , if does not work see if there is a preferred alternative and let me know, the med is a PPI, proton pump inhibitor, for reflux  Additional Follow-up by: Syliva Overman MD,  November 26, 2010 4:40 AM    New/Updated Medications: PANTOPRAZOLE SODIUM 40 MG TBEC (PANTOPRAZOLE SODIUM) Take 1 tablet by mouth once a day Prescriptions: PANTOPRAZOLE SODIUM 40 MG TBEC (PANTOPRAZOLE SODIUM) Take 1 tablet by mouth once a day  #30 x 4   Entered by:   Adella Hare LPN   Authorized by:   Syliva Overman MD   Signed by:   Adella Hare LPN on 16/08/9603   Method used:   Electronically to        [facility] (retail)       [address]       [place], [place]  [address]       Ph: [phone]       Fax: [phone]   RxID:   [id number] PANTOPRAZOLE SODIUM 40 MG TBEC (PANTOPRAZOLE SODIUM) Take 1 tablet by mouth once a day  #30 x 4   Entered and Authorized by:   Syliva Overman MD   Signed by:   Syliva Overman MD on 11/26/2010   Method used:   Historical   RxID:   [id number]  med sent and patient aware Adella Hare LPN  December 01, 2010 8:43 AM

## 2011-01-16 ENCOUNTER — Ambulatory Visit (INDEPENDENT_AMBULATORY_CARE_PROVIDER_SITE_OTHER): Payer: PRIVATE HEALTH INSURANCE | Admitting: Family Medicine

## 2011-01-16 ENCOUNTER — Encounter: Payer: Self-pay | Admitting: Family Medicine

## 2011-01-16 DIAGNOSIS — F329 Major depressive disorder, single episode, unspecified: Secondary | ICD-10-CM

## 2011-01-16 DIAGNOSIS — I10 Essential (primary) hypertension: Secondary | ICD-10-CM

## 2011-01-16 DIAGNOSIS — E663 Overweight: Secondary | ICD-10-CM

## 2011-01-17 LAB — CONVERTED CEMR LAB: Hgb A1c MFr Bld: 5.7 % — ABNORMAL HIGH (ref <5.7)

## 2011-01-22 NOTE — Assessment & Plan Note (Signed)
Summary: follow up   Vital Signs:  Patient profile:   55 year old female Menstrual status:  hysterectomy Height:      66 inches Weight:      180.25 pounds BMI:     29.20 O2 Sat:      98 % Pulse rate:   85 / minute Pulse rhythm:   regular Resp:     16 per minute BP sitting:   150 / 100  (left arm)  Vitals Entered By: Adella Hare LPN (January 15, 1609 9:53 AM)  Nutrition Counseling: Patient's BMI is greater than 25 and therefore counseled on weight management options. CC: follow-up visit Is Patient Diabetic? No   Primary Care Provider:  Syliva Overman MD  CC:  follow-up visit.  History of Present Illness: Reports  thatshe has had a dull frontal headache relived somewhatr by oTC sinus pills in the past 1 month. She also reports that she had her blood sugar checked on the job recently , and it was "high":She does have a family h/o diabetes and hypertension and is concerned about both. Her BP was high at her last visit Denies recent fever or chills. Denies sinus pressure, nasal congestion , ear pain or sore throat. Denies chest congestion, or cough productive of sputum. Denies chest pain, palpitations, PND, orthopnea or leg swelling. Denies abdominal pain, nausea, vomitting, diarrhea or constipation. Denies change in bowel movements or bloody stool. Denies dysuria , frequency, incontinence or hesitancy. Denies  joint pain, swelling, or reduced mobility. Denies vertigo, seizures. Denies depression, anxiety or insomnia. Denies  rash, lesions, or itch. She has been exercising more and modified her diet somewhat in weight loss attempt, she is disappointed so far , but intends to persevere.     Current Medications (verified): 1)  Aspirin 81 Mg Tbec (Aspirin) .... One Tab By Mouth Qd 2)  Oscal 500/200 D-3 500-200 Mg-Unit Tabs (Calcium-Vitamin D) .... One Tab By Mouth Three Times A Day 3)  Seroquel Xr 150 Mg Xr24h-Tab (Quetiapine Fumarate) .... Take 1 Tablet By Mouth Once A  Day 4)  Aleve 220 Mg Tabs (Naproxen Sodium) .... As Needed 5)  Pantoprazole Sodium 40 Mg Tbec (Pantoprazole Sodium) .... Take 1 Tablet By Mouth Once A Day 6)  Ra Allergy Medication 25 Mg Caps (Diphenhydramine Hcl) .... One Cap By Mouth Once Daily As Needed  Allergies (verified): 1)  ! Codeine  Review of Systems      See HPI General:  Complains of fatigue. Eyes:  Denies blurring, discharge, eye pain, and red eye. Endo:  Denies cold intolerance and excessive hunger. Heme:  Denies abnormal bruising and bleeding. Allergy:  Denies hives or rash and itching eyes.  Physical Exam  General:  Well-developed,obese,in no acute distress; alert,appropriate and cooperative throughout examination HEENT: No facial asymmetry,  EOMI, No sinus tenderness, TM's Clear, oropharynx  pink and moist.   Chest: Clear to auscultation bilaterally.  CVS: S1, S2, No murmurs, No S3.   Abd: Soft, Nontender.  MS: Adequate ROM spine, hips, shoulders and knees.  Ext: No edema.   CNS: CN 2-12 intact, power tone and sensation normal throughout.   Skin: Intact, no visible lesions or rashes.  Psych: Good eye contact, normal affect.  Memory intact, not anxious or depressed appearing.    Impression & Recommendations:  Problem # 1:  HYPERTENSION (ICD-401.9) Assessment Deteriorated  Her updated medication list for this problem includes:    Triamterene-hctz 37.5-25 Mg Caps (Triamterene-hctz) .Marland Kitchen... Take 1 tablet by mouth once  a day  BP today: 150/100 Prior BP: 130/90 (10/17/2010)  Labs Reviewed: K+: 4.0 (10/20/2010) Creat: : 0.78 (10/20/2010)   Chol: 156 (10/20/2010)   HDL: 54 (10/20/2010)   LDL: 97 (10/20/2010)   TG: 26 (10/20/2010)  Problem # 2:  OVERWEIGHT (ICD-278.02) Assessment: Improved  Ht: 66 (01/16/2011)   Wt: 180.25 (01/16/2011)   BMI: 29.20 (01/16/2011) therapeutic lifestyle change discussed and encouraged  Problem # 3:  DEPRESSION (ICD-311) Assessment: Improved pt to continue seroquel  Complete  Medication List: 1)  Aspirin 81 Mg Tbec (Aspirin) .... One tab by mouth qd 2)  Oscal 500/200 D-3 500-200 Mg-unit Tabs (Calcium-vitamin d) .... One tab by mouth three times a day 3)  Seroquel Xr 150 Mg Xr24h-tab (Quetiapine fumarate) .... Take 1 tablet by mouth once a day 4)  Aleve 220 Mg Tabs (Naproxen sodium) .... As needed 5)  Pantoprazole Sodium 40 Mg Tbec (Pantoprazole sodium) .... Take 1 tablet by mouth once a day 6)  Ra Allergy Medication 25 Mg Caps (Diphenhydramine hcl) .... One cap by mouth once daily as needed 7)  Triamterene-hctz 37.5-25 Mg Caps (Triamterene-hctz) .... Take 1 tablet by mouth once a day  Other Orders: T- Hemoglobin A1C ([id number])  Patient Instructions: 1)  Please schedule a follow-up appointment in 1 month to 5 weeks 2)  It is important that you exercise regularly at least 60 minutes 5 times a week. If you develop chest pain, have severe difficulty breathing, or feel very tired , stop exercising immediately and seek medical attention. 3)  You need to lose weight. Consider a lower calorie diet and regular exercise.  4)  We will provide the DASH diet. 5)  your blood pressure is high, you will start  medication for this today. 6)  HBA1C  today Prescriptions: SEROQUEL XR 150 MG XR24H-TAB (QUETIAPINE FUMARATE) Take 1 tablet by mouth once a day  #30 x 3   Entered by:   Adella Hare LPN   Authorized by:   Syliva Overman MD   Signed by:   Adella Hare LPN on 14/78/2956   Method used:   Electronically to        [facility] (retail)       [address]       [place], [place]  [address]       Ph: [phone]       Fax: [phone]   RxID:   [id number] TRIAMTERENE-HCTZ 37.5-25 MG CAPS (TRIAMTERENE-HCTZ) Take 1 tablet by mouth once a day  #30 x 3   Entered and Authorized by:   Syliva Overman MD   Signed by:   Syliva Overman MD on 01/16/2011   Method used:   Electronically to        [facility] (retail)        [address]       [place], [place]  [address]       Ph: [phone]       Fax: [phone]   RxID:   [id number]    Orders Added: 1)  Est. Patient Level IV [20254] 2)  T- Hemoglobin A1C [83036-23375]   seroquel 50 samples given [id number] 3/14

## 2011-02-09 ENCOUNTER — Telehealth: Payer: Self-pay | Admitting: Family Medicine

## 2011-02-09 ENCOUNTER — Emergency Department (HOSPITAL_COMMUNITY)
Admission: EM | Admit: 2011-02-09 | Discharge: 2011-02-09 | Disposition: A | Discharge status: Home / Self Care | Payer: PRIVATE HEALTH INSURANCE | Attending: Emergency Medicine | Admitting: Emergency Medicine

## 2011-02-09 DIAGNOSIS — R197 Diarrhea, unspecified: Secondary | ICD-10-CM | POA: Insufficient documentation

## 2011-02-09 DIAGNOSIS — R112 Nausea with vomiting, unspecified: Secondary | ICD-10-CM | POA: Insufficient documentation

## 2011-02-09 DIAGNOSIS — R300 Dysuria: Secondary | ICD-10-CM | POA: Insufficient documentation

## 2011-02-09 DIAGNOSIS — R109 Unspecified abdominal pain: Secondary | ICD-10-CM | POA: Insufficient documentation

## 2011-02-09 DIAGNOSIS — N390 Urinary tract infection, site not specified: Secondary | ICD-10-CM | POA: Insufficient documentation

## 2011-02-09 LAB — COMPREHENSIVE METABOLIC PANEL
ALT: 42 U/L — ABNORMAL HIGH (ref 0–35)
Alkaline Phosphatase: 57 U/L (ref 39–117)
CO2: 24 mEq/L (ref 19–32)
Chloride: 105 mEq/L (ref 96–112)
GFR calc non Af Amer: 57 mL/min — ABNORMAL LOW (ref >60)
Glucose, Bld: 127 mg/dL — ABNORMAL HIGH (ref 70–99)
Potassium: 4 mEq/L (ref 3.5–5.1)
Sodium: 139 mEq/L (ref 135–145)
Total Protein: 7.6 g/dL (ref 6.0–8.3)

## 2011-02-09 LAB — URINALYSIS, ROUTINE W REFLEX MICROSCOPIC
Bilirubin Urine: NEGATIVE
Glucose, UA: NEGATIVE mg/dL
Ketones, ur: NEGATIVE mg/dL
Protein, ur: NEGATIVE mg/dL
Urobilinogen, UA: 0.2 mg/dL (ref 0.0–1.0)

## 2011-02-09 LAB — URINE MICROSCOPIC-ADD ON

## 2011-02-09 LAB — DIFFERENTIAL
Basophils Absolute: 0 10*3/uL (ref 0.0–0.1)
Lymphocytes Relative: 2 % — ABNORMAL LOW (ref 12–46)
Neutro Abs: 14.1 10*3/uL — ABNORMAL HIGH (ref 1.7–7.7)

## 2011-02-09 LAB — CBC
HCT: 45.7 % (ref 36.0–46.0)
Hemoglobin: 15.1 g/dL — ABNORMAL HIGH (ref 12.0–15.0)
WBC: 15 10*3/uL — ABNORMAL HIGH (ref 4.0–10.5)

## 2011-02-09 LAB — LIPASE, BLOOD: Lipase: 12 U/L (ref 11–59)

## 2011-02-09 NOTE — Telephone Encounter (Signed)
Woke up this morning with vomitting, nausea, diarrhea, blood in stool with clots Advised ER for eval  Patient agrees

## 2011-02-10 ENCOUNTER — Encounter: Payer: Self-pay | Admitting: Family Medicine

## 2011-02-12 LAB — URINE CULTURE
Colony Count: >=100000
Culture  Setup Time: 201203261910

## 2011-02-23 ENCOUNTER — Encounter: Payer: Self-pay | Admitting: Family Medicine

## 2011-02-24 ENCOUNTER — Encounter: Payer: Self-pay | Admitting: Family Medicine

## 2011-03-10 ENCOUNTER — Encounter: Payer: Self-pay | Admitting: Family Medicine

## 2011-03-11 ENCOUNTER — Ambulatory Visit (INDEPENDENT_AMBULATORY_CARE_PROVIDER_SITE_OTHER): Payer: PRIVATE HEALTH INSURANCE | Admitting: Family Medicine

## 2011-03-11 ENCOUNTER — Encounter: Payer: Self-pay | Admitting: Family Medicine

## 2011-03-11 VITALS — BP 118/84 | HR 84 | Resp 16 | Ht 64.0 in | Wt 180.4 lb

## 2011-03-11 DIAGNOSIS — F17200 Nicotine dependence, unspecified, uncomplicated: Secondary | ICD-10-CM

## 2011-03-11 DIAGNOSIS — I10 Essential (primary) hypertension: Secondary | ICD-10-CM

## 2011-03-11 DIAGNOSIS — E663 Overweight: Secondary | ICD-10-CM

## 2011-03-11 DIAGNOSIS — K219 Gastro-esophageal reflux disease without esophagitis: Secondary | ICD-10-CM

## 2011-03-11 NOTE — Progress Notes (Signed)
  Subjective:    Patient ID: Lydia Perez, female    DOB: April 28, 1956, 55 y.o.   MRN: [id number]  HPI Pt had acute gastroenteritis in Feb kept her put for 3 days went to the Ed had recetal, bleeding which has resolved. Next colonscopy due in 2019  Needs to get her mammogram result. The PT is here for follow up and re-evaluation of chronic medical conditions, medication management and review of any  recent lab and radiology data.  Preventive health is updated, specifically  Cancer screening,  and Immunization.    The PT denies any adverse reactions to current medications since the last visit.  There are no new concerns.  There are no specific complaints       Review of Systems Denies recent fever or chills. Denies sinus pressure, nasal congestion, ear pain or sore throat. Denies chest congestion, productive cough or wheezing. Denies chest pains, palpitations, and leg swelling Denies abdominal pain, nausea, vomiting,diarrhea or constipation. . Denies dysuria, frequency, hesitancy or incontinence. Denies joint pain, swelling and limitation in mobility. Denies headaches, seizure, numbness, or tingling. Denies depression, anxiety or insomnia. Denies skin break down or rash.        Objective:   Physical Exam Patient alert and oriented and in no Cardiopulmonary distress.  HEENT: No facial asymmetry, EOMI, no sinus tenderness, TM's clear, Oropharynx pink and moist.  Neck supple no adenopathy.  Chest: Clear to auscultation bilaterally.  CVS: S1, S2 no murmurs, no S3.  ABD: Soft non tender. Bowel sounds normal.  Ext: No edema  MS: Adequate ROM spine, shoulders, hips and knees.  Skin: Intact, no ulcerations or rash noted.  Psych: Good eye contact, normal affect. Memory intact not anxious or depressed appearing.  CNS: CN 2-12 intact, power, tone and sensation normal throughout.        Assessment & Plan:

## 2011-03-11 NOTE — Patient Instructions (Signed)
F/u in 6 months.  Fasting chem7, in  6months.   It is important that you exercise regularly at least 30 minutes7 times a week. If you develop chest pain, have severe difficulty breathing, or feel very tired, stop exercising immediately and seek medical attention   A healthy diet is rich in fruit, vegetables and whole grains. Poultry fish, nuts and beans are a healthy choice for protein rather then red meat. A low sodium diet and drinking 64 ounces of water daily is generally recommended. Oils and sweet should be limited. Carbohydrates especially for those who are diabetic or overweight, should be limited to 34-45 gram per meal. It is important to eat on a regular schedule, at least 3 times daily. Snacks should be primarily fruits, vegetables or nuts.  Goal weight loss is 5 to 10 pounds.   Please think about quitting smoking.  This is very important for your health.  Consider setting a quit date, then cutting back or switching brands to prepare to stop.  Also think of the money you will save every day by not smoking.  Quick Tips to Quit Smoking: Fix a date i.e. keep a date in mind from when you would not touch a tobacco product to smoke  Keep yourself busy and block your mind with work loads or reading books or watching movies in malls where smoking is not allowed  Vanish off the things which reminds you about smoking for example match box, or your favorite lighter, or the pipe you used for smoking, or your favorite jeans and shirt with which you used to enjoy smoking, or the club where you used to do smoking  Try to avoid certain people places and incidences where and with whom smoking is a common factor to add on  Praise yourself with some token gifts from the money you saved by stopping smoking  Anti Smoking teams are there to help you. Join their programs  Anti-smoking Gums are there in many medical shops. Try them to quit smoking   Side-effects of Smoking: Disease caused by smoking  cigarettes are emphysema, bronchitis, heart failures  Premature death  Cancer is the major side effect of smoking  Heart attacks and strokes are the quick effects of smoking causing sudden death  Some smokers lives end up with limbs amputated  Breathing problem or fast breathing is another side effect of smoking  Due to more intakes of smokes, carbon mono-oxide goes into your brain and other muscles of the body which leads to swelling of the veins and blockage to the air passage to lungs  Carbon monoxide blocks blood vessels which leads to blockage in the flow of blood to different major body organs like heart lungs and thus leads to attacks and deaths  During pregnancy smoking is very harmful and leads to premature birth of the infant, spontaneous abortions, low weight of the infant during birth  Fat depositions to narrow and blocked blood vessels causing heart attacks  In many cases cigarette smoking caused infertility in men

## 2011-03-25 NOTE — Assessment & Plan Note (Signed)
Deteriorated. Patient re-educated about  the importance of commitment to a  minimum of 150 minutes of exercise per week. The importance of healthy food choices with portion control discussed. Encouraged to start a food diary, count calories and to consider  joining a support group. Sample diet sheets offered. Goals set by the patient for the next several months.    

## 2011-03-25 NOTE — Assessment & Plan Note (Signed)
Unchanged, counseled to quit 

## 2011-03-25 NOTE — Assessment & Plan Note (Signed)
Controlled, no change in medication  

## 2011-03-31 NOTE — Consult Note (Signed)
[name], [name]             ACCOUNT NO.:  [id number]   MEDICAL RECORD NO.:  [id number]          PATIENT TYPE:  AMB   LOCATION:  DAY                           FACILITY:  [facility]   PHYSICIAN:  Kassie Mends, M.D.      DATE OF BIRTH:  1956/05/23   DATE OF CONSULTATION:  07/01/2007  DATE OF DISCHARGE:                                 CONSULTATION   REASON FOR CONSULTATION:  Abdominal pain, weight loss.   REQUESTING PHYSICIAN:  Dr. Erle Crocker.   HISTORY OF PRESENT ILLNESS:  [name] is a 55 year old female who  presents for further evaluation of abdominal pain and weight loss.  She  states, over the past few weeks, she has developed postprandial  mid/epigastric abdominal pain.  She really has not noticed any  particular food that causes it.  She has been quite nauseated but no  vomiting.  She has typical GERD symptoms, which were not controlled on  Nexium once daily.  Now that she has increased her dose to b.i.d.,  typical GERD symptoms are controlled.  She has a history of esophageal  stricture requiring dilatation about 7 or 8 years ago, when she was  living in [place].  She denies any dysphagia or odynophagia at this  time, denies any history of peptic ulcer disease.  She state she  generally has had no appetite.  She has to force herself to eat.  She  reports about a year ago that she weighed 211 pounds.  She says she has  had a lot of stress.  She has been separated from her husband for about  3-1/2 years.  According to Dr. Jen Mow, she had a documented 10-pound  weight loss since August of 2007.   CURRENT MEDICATIONS:  1. Nexium 40 mg b.i.d.  2. Gel for hot flashes daily.   ALLERGIES:  CODEINE.   PAST MEDICAL HISTORY:  History of hiatal hernia, history of esophageal  stricture.  Last EGD done approximately 7 to 8 years ago in [place].  She had a heart catheterization in 2002.  The patient states she has  borderline bipolar disorder but has refused medical therapy.   She is  status post hysterectomy with bilateral salpingo-oophorectomy secondary  to fibroids in March 2006.  She has had a tubal ligation, no prior  colonoscopy.   FAMILY HISTORY:  Father deceased.  He had emphysema and possibly cancer.  Mother deceased, had a history of kidney disease and heart disease and  diabetes.  Brother died with an MI at age 6.   SOCIAL HISTORY:  She is currently separated.  She is employed at [employer].  She smoked 4 packs per day for over 20 years but quit in 1997.  She recently started smoking again.  She is currently smoking 2 packs  per week.  Denies any alcohol or drug use.   REVIEW OF SYSTEMS:  See HPI for GI and for constitutional.  CARDIOPULMONARY:  She denies any chest pain, shortness of breath,  chronic cough.  GENITOURINARY:  Denies any dysuria, hematuria.   PHYSICAL EXAMINATION:  VITAL SIGNS:  Weight 141.5 pounds, height 5 feet  4 inches, temp 97.9, blood pressure 104/70, pulse 72.  GENERAL:  Pleasant, well-nourished, well-developed female in no acute  distress.  SKIN:  Warm and dry, no jaundice.  HEENT:  Sclerae nonicteric, oropharyngeal mucosa moist and pink.  No  lesions, erythema or exudate.  No lymphadenopathy, thyromegaly.  CHEST:  Lungs are clear to auscultation.  CARDIAC:  Reveals a regular rate and rhythm, normal S1, S2.  No murmurs,  rubs or gallops.  ABDOMEN:  Positive bowel sounds.  ABDOMEN:  Soft.  She has moderate epigastric tenderness to deep  palpation.  No organomegaly or masses or rebound tenderness.  No  guarding.  No abdominal bruits or hernias.  EXTREMITIES:  No edema.   CHEST X-RAY:  Mild bronchitic changes.   LABORATORY:  White count 5,200, hemoglobin 14, hematocrit 43.7,  platelets 183,000, sodium 141, potassium 3.6, BUN 11, creatinine 0.87,  total bilirubin 0.5, alkaline phosphatase 53, AST 20, ALT 17, albumin  4.1, amylase 63, lipase 32, TSH 2.574.   IMPRESSION:  [name] is a pleasant 55 year old lady  with a history  of chronic gastroesophageal reflux disease, complicated in the past with  esophageal stricture requiring dilatation.  She now presents with  several-week history of epigastric pain which is postprandial.  She  reports a significant weight loss of over 70 pounds; however, at least  since August of 2007 according to Dr. Virgilio Belling record, she has lost only  10 pounds.   DIFFERENTIAL DIAGNOSES:  Includes peptic ulcer disease, complicated  gastroesophageal reflux disease, gastritis, biliary etiology.  The  patient's weight loss may be in part due to stress, as it appears most  of her weight much have come off initially a couple of years ago, and  she admits this was when she was very stressed about her separation.   PLAN:  EGD to further evaluate ongoing symptoms.  If EGD is  unremarkable, will pursue further workup of abdominal pain and weight  loss.   I would like to thank Dr. Jen Mow for allowing [place] to take part in the care  of this patient.      Tana Coast, P.A.      Kassie Mends, M.D.  Electronically Signed    LL/MEDQ  D:  07/01/2007  T:  07/01/2007  Job:  [id number]   cc:   Erle Crocker, M.D.

## 2011-03-31 NOTE — Assessment & Plan Note (Signed)
NAME:  Lydia Perez, Lydia Perez              CHART#:  [id number]   DATE:                                   DOB:  [birth year]   REFERRING PHYSICIAN:  Erle Crocker, M.D.   PROBLEM LIST:  1. Abdominal pain and weight loss possibly secondary to gastritis      and/or anxiety and depression.  2. Hot flashes.  3. Hiatal hernia.  4. Esophageal stricture, dilated in [place] 7-8 years ago.  5. Heart cath in 2002.  6. Hysterectomy with bilateral salpingo-oophorectomy.   SUBJECTIVE:  The patient is a 55 year old female who presents as a  return patient visit.  She has only experienced pain in her abdomen once  since her procedure.  She has been taking Nexium twice a day.  Her pain  was brought on by a baloney sandwich.  She also complains of feeling  anxious.  She states she declined a prescription for Prozac in the past  for depression.  She states she is no longer depressed but she might be  anxious.   MEDICATIONS:  1. Nexium 40 mg b.i.d.  2. Estrogen for hot flashes daily.   OBJECTIVE:  VITAL SIGNS:  Weight 142 pounds (unchanged since August  2008), height 5 feet 4 inches, BMI 24.4 (healthy), temperature 98.1,  blood pressure 100/68, pulse 68.  GENERAL:  She is in no apparent distress. LUNGS:  Clear to auscultation  bilaterally.CARDIOVASCULAR:  Regular rhythm.  No murmur.  Normal S1 and  S2. ABDOMEN:  Bowel sounds are present, soft, nontender, nondistended.  No rebound or guarding.   ASSESSMENT:  The patient is a 55 year old female who intermittently has  epigastric pain which may be related to gastritis.  She is also  requesting medication for anxiety.   Thank you for allowing me to see the patient in consultation.  My  recommendations follow.   RECOMMENDATIONS:  1. I did give [name] a few hydroxyzine to take as needed for      anxiety.  2. She should continue her Nexium twice daily.  She may also try going      down to once daily.  3. She may return to see me as  needed.       Kassie Mends, M.D.  Electronically Signed     SM/MEDQ  D:  09/07/2007  T:  09/08/2007  Job:  [id number]   cc:   Erle Crocker, M.D.

## 2011-03-31 NOTE — Op Note (Signed)
[name], [name]             ACCOUNT NO.:  [id number]   MEDICAL RECORD NO.:  [id number]          PATIENT TYPE:  AMB   LOCATION:  DAY                           FACILITY:  [facility]   PHYSICIAN:  Kassie Mends, M.D.      DATE OF BIRTH:  [birth year]   DATE OF PROCEDURE:  08/13/2008  DATE OF DISCHARGE:                                PROCEDURE NOTE   REFERRING PHYSICIAN:  Erle Crocker, MD   PROCEDURE:  Colonoscopy with snare cautery and cold forceps polypectomy.   INDICATIONS FOR EXAM:  [name] is a 55 year old female who presents  for average risk colon cancer screening.   FINDINGS:  1. A 5-mm sessile hepatic flexure polyp removed via snare cautery.  A      6-mm flat descending colon polyp removed via snare cautery.  A 3-5      mm sessile polyp seen in the remaining descending colon and sigmoid      colon, which were removed via cold forceps.  Otherwise, no masses,      no inflammatory changes, diverticula, or AVMs seen.  2. Normal retroflexed view of the rectum.   RECOMMENDATIONS:  1. Screening colonoscopy in 10 years.  2. No aspirin, NSAIDs, or anticoagulation for 7 days.  3. She should follow a high-fiber diet.  She is given a handout on      high-fiber diet and polyps.  4. We will call [name] with the results of her biopsies.   MEDICATIONS:  1. Demerol 125 mg IV.  2. Versed 6 mg IV.   PROCEDURE TECHNIQUE:  Physical exam was performed.  Informed consent was  obtained from the patient after explaining the benefits, risks, and  alternatives to the procedure.  The patient was connected to the monitor  and placed in the left lateral position.  Continuous oxygen was provided  via nasal cannula, and IV medicine administered through an indwelling  cannula.  After administration of sedation on rectal exam, the patient's  rectum was intubated and the scope was advanced under direct  visualization to the cecum.  The scope was removed slowly by carefully  examining the  color, texture, anatomy, and integrity of mucosa on the  way out.  The patient was recovered in Endoscopy and discharged home in  satisfactory condition.   PATH:  SIMPLE ADENOMA/HYPERPLASTIC POLYP. TCS IN 10 YEARS. HIGH FIBER  DIET      Kassie Mends, M.D.  Electronically Signed     SM/MEDQ  D:  08/13/2008  T:  08/14/2008  Job:  [id number]   cc:   Erle Crocker, M.D.

## 2011-03-31 NOTE — Op Note (Signed)
NAMEREIGHAN, Perez             ACCOUNT NO.:  [id number]   MEDICAL RECORD NO.:  [id number]          PATIENT TYPE:  AMB   LOCATION:  DAY                           FACILITY:  [facility]   PHYSICIAN:  Kassie Mends, M.D.      DATE OF BIRTH:  1956-07-12   DATE OF PROCEDURE:  07/19/2007  DATE OF DISCHARGE:                                PROCEDURE NOTE   REFERRING PHYSICIAN:  Erle Crocker, MD   PROCEDURE:  Esophagogastroduodenoscopy.   ENDOSCOPIST:  Kassie Mends, M.D.   INDICATION FOR EXAM:  [name] is a 55 year old female who presents  with abdominal pain and reported 60- to 70-pound weight loss.  She  denies any difficulty swallowing, nausea, vomiting, melena or  hematochezia.   FINDINGS:  1. Normal esophagus without evidence of Barrett's, mass, ulcers or      erosions.  2. Normal stomach without evidence of erosions, mass or ulcerations.  3. Normal duodenal bulb and second portion of the duodenum.   RECOMMENDATIONS:  1. CT scan of the abdomen and pelvis with IV and oral contrast as soon      as possible to evaluate her abdominal pain.  2. Follow up appointment in 4 weeks with Tana Coast for abdominal      pain.  3. She may resume her previous diet.  4. She should continue Nexium at 40 mg once daily.   MEDICATIONS:  1. Demerol 100 mg IV.  2. Versed 6 mg IV.   PROCEDURE TECHNIQUE:  Physical exam was performed and informed consent  was obtained from the patient after explaining the benefits, risks and  alternatives to the procedure.  The patient was connected to the monitor  and placed in the left lateral position.  Continuous oxygen was provided  by nasal cannula and IV medicine administered through an indwelling  cannula.  After administration of sedation, the patient's esophagus  intubated and the scope was advanced under direct visualization to the  second portion of the duodenum.  The scope was removed slowly by  carefully examining the color, texture, anatomy and  integrity of the  mucosa on the way out.  [name] was agitated and needed to be  restrained in order for the exam to be completed.  The patient was  recovered in Endoscopy and discharged home in satisfactory condition.      Kassie Mends, M.D.  Electronically Signed     SM/MEDQ  D:  07/19/2007  T:  07/19/2007  Job:  [id number]   cc:   Erle Crocker, M.D.

## 2011-06-10 ENCOUNTER — Other Ambulatory Visit: Payer: Self-pay

## 2011-06-10 ENCOUNTER — Telehealth: Payer: Self-pay | Admitting: Family Medicine

## 2011-06-10 MED ORDER — PANTOPRAZOLE SODIUM 40 MG PO TBEC: DELAYED_RELEASE_TABLET | ORAL | Status: DC

## 2011-06-10 NOTE — Telephone Encounter (Signed)
Med sent in for patient  

## 2011-06-13 ENCOUNTER — Encounter (HOSPITAL_COMMUNITY): Payer: Self-pay | Admitting: Emergency Medicine

## 2011-06-13 ENCOUNTER — Emergency Department (HOSPITAL_COMMUNITY)
Admission: EM | Admit: 2011-06-13 | Discharge: 2011-06-13 | Disposition: A | Discharge status: Home / Self Care | Payer: PRIVATE HEALTH INSURANCE | Attending: Emergency Medicine | Admitting: Emergency Medicine

## 2011-06-13 DIAGNOSIS — X58XXXA Exposure to other specified factors, initial encounter: Secondary | ICD-10-CM | POA: Insufficient documentation

## 2011-06-13 DIAGNOSIS — F17200 Nicotine dependence, unspecified, uncomplicated: Secondary | ICD-10-CM | POA: Insufficient documentation

## 2011-06-13 DIAGNOSIS — T148XXA Other injury of unspecified body region, initial encounter: Secondary | ICD-10-CM | POA: Insufficient documentation

## 2011-06-13 DIAGNOSIS — M79609 Pain in unspecified limb: Secondary | ICD-10-CM | POA: Insufficient documentation

## 2011-06-13 MED ORDER — METHOCARBAMOL 500 MG PO TABS: ORAL_TABLET | ORAL | Status: DC

## 2011-06-13 MED ORDER — HYDROCODONE-ACETAMINOPHEN 7.5-325 MG PO TABS: 1.0000 | ORAL_TABLET | ORAL | Status: AC | PRN

## 2011-06-13 NOTE — ED Provider Notes (Signed)
History     Chief Complaint  Patient presents with  . Leg Pain   Patient is a 55 y.o. female presenting with leg pain. The history is provided by the patient.  Leg Pain  The incident occurred 2 days ago. The incident occurred at work. Injury mechanism: Standing, bending, lifting. The pain is present in the left thigh. Quality: sharlpe. The pain is at a severity of 10/10. The pain is severe. Associated symptoms include inability to bear weight. The symptoms are aggravated by activity and bearing weight. She has tried NSAIDs for the symptoms. The treatment provided no relief.    Past Medical History  Diagnosis Date  . Anemia     NOS   . Hiatal hernia   . Bipolar disorder 2010    dx by PCP   . History of cardiac cath 2002  . Esophageal stricture     Past Surgical History  Procedure Date  . Total abdominal hysterectomy w/ bilateral salpingoophorectomy 01/2005  . Tubal ligation   . Esophagogastroduodenoscopy     with esophageal dilation     Family History  Problem Relation Age of Onset  . Emphysema Father   . Kidney disease Mother   . Heart disease Mother     1st by pass in her 69's   . Heart failure Mother   . Diabetes Mother   . Hypertension Mother   . Heart attack Brother   . Heart failure Brother     History  Substance Use Topics  . Smoking status: Current Everyday Smoker -- 1.0 packs/day for 4 years    Types: Cigarettes  . Smokeless tobacco: Never Used  . Alcohol Use: No    OB History    Grav Para Term Preterm Abortions TAB SAB Ect Mult Living   3 3 3       3       Review of Systems  Constitutional: Negative for activity change.       All ROS Neg except as noted in HPI  HENT: Negative for nosebleeds and neck pain.   Eyes: Negative for photophobia and discharge.  Respiratory: Negative for cough, shortness of breath and wheezing.   Cardiovascular: Negative for chest pain and palpitations.  Gastrointestinal: Negative for abdominal pain and blood in stool.    Genitourinary: Negative for dysuria, frequency and hematuria.  Musculoskeletal: Negative for back pain and arthralgias.  Skin: Negative.   Neurological: Negative for dizziness, seizures and speech difficulty.  Psychiatric/Behavioral: Negative for hallucinations and confusion.    Physical Exam  BP 151/86  Pulse 78  Temp(Src) 98.1 F (36.7 C) (Oral)  Resp 18  Ht 5\' 4"  (1.626 m)  Wt 177 lb 3.2 oz (80.377 kg)  BMI 30.42 kg/m2  SpO2 98%  Physical Exam  Nursing note and vitals reviewed. Constitutional: She is oriented to person, place, and time. She appears well-developed and well-nourished.  Non-toxic appearance.  HENT:  Head: Normocephalic.  Right Ear: Tympanic membrane and external ear normal.  Left Ear: Tympanic membrane and external ear normal.  Eyes: EOM and lids are normal. Pupils are equal, round, and reactive to light.  Neck: Normal range of motion. Neck supple. Carotid bruit is not present.  Cardiovascular: Normal rate, regular rhythm, normal heart sounds, intact distal pulses and normal pulses.   Pulmonary/Chest: Breath sounds normal. No respiratory distress.  Abdominal: Soft. Bowel sounds are normal. There is no tenderness. There is no guarding.  Musculoskeletal:       Pain an spasm in the  left thigh with ROM  Of the left lower ext. No Hernia noted. No redness, No swelling. No hot.  Lymphadenopathy:       Head (right side): No submandibular adenopathy present.       Head (left side): No submandibular adenopathy present.    She has no cervical adenopathy.  Neurological: She is alert and oriented to person, place, and time. She has normal strength. No cranial nerve deficit or sensory deficit.  Skin: Skin is warm and dry.  Psychiatric: She has a normal mood and affect. Her speech is normal.    ED Course  Procedures  MDM I have reviewed nursing notes, vital signs, and all appropriate lab and imaging results for this patient.      Kathie Dike, [place] 06/13/11  1035

## 2011-06-13 NOTE — ED Notes (Signed)
Patient c/o left thigh [pain that radiates into grion area that started yesterday and has gradual gotten worse. Denies any known injury.

## 2011-07-11 NOTE — ED Provider Notes (Signed)
Medical screening examination/treatment/procedure(s) were conducted as a shared visit with non-physician practitioner(s) and myself.  I personally evaluated the patient during the encounter  Doug Sou, MD 07/11/11 1711

## 2011-08-28 LAB — CREATININE, SERUM: GFR calc Af Amer: >60

## 2011-09-09 ENCOUNTER — Encounter: Payer: Self-pay | Admitting: Family Medicine

## 2011-09-10 ENCOUNTER — Ambulatory Visit (INDEPENDENT_AMBULATORY_CARE_PROVIDER_SITE_OTHER): Payer: PRIVATE HEALTH INSURANCE | Admitting: Family Medicine

## 2011-09-10 ENCOUNTER — Encounter: Payer: Self-pay | Admitting: Family Medicine

## 2011-09-10 ENCOUNTER — Other Ambulatory Visit: Payer: Self-pay | Admitting: Family Medicine

## 2011-09-10 VITALS — BP 140/90 | HR 62 | Resp 16 | Ht 64.0 in | Wt 173.8 lb

## 2011-09-10 DIAGNOSIS — K219 Gastro-esophageal reflux disease without esophagitis: Secondary | ICD-10-CM

## 2011-09-10 DIAGNOSIS — E663 Overweight: Secondary | ICD-10-CM

## 2011-09-10 DIAGNOSIS — F17200 Nicotine dependence, unspecified, uncomplicated: Secondary | ICD-10-CM

## 2011-09-10 DIAGNOSIS — E669 Obesity, unspecified: Secondary | ICD-10-CM

## 2011-09-10 DIAGNOSIS — I10 Essential (primary) hypertension: Secondary | ICD-10-CM

## 2011-09-10 MED ORDER — TRIAMTERENE-HCTZ 37.5-25 MG PO TABS: 1.0000 | ORAL_TABLET | Freq: Every day | ORAL | Status: DC

## 2011-09-10 NOTE — Patient Instructions (Addendum)
CPE in January.  You need to resume your medication for blood pressure it is high  I am happy that you realize you need to stop smoking and that you want to. Please cut down by 1 cigarette every 2 weeks, you need to start 9/day as of today.  It is important that you exercise regularly at least 30 minutes 5 times a week. If you develop chest pain, have severe difficulty breathing, or feel very tired, stop exercising immediately and seek medical attention    A healthy diet is rich in fruit, vegetables and whole grains. Poultry fish, nuts and beans are a healthy choice for protein rather then red meat. A low sodium diet and drinking 64 ounces of water daily is generally recommended. Oils and sweet should be limited. Carbohydrates especially for those who are diabetic or overweight, should be limited to 30-45 gram per meal. It is important to eat on a regular schedule, at least 3 times daily. Snacks should be primarily fruits, vegetables or nuts.  Please think about quitting smoking.  This is very important for your health.  Consider setting a quit date, then cutting back or switching brands to prepare to stop.  Also think of the money you will save every day by not smoking.  Quick Tips to Quit Smoking: Fix a date i.e. keep a date in mind from when you would not touch a tobacco product to smoke  Keep yourself busy and block your mind with work loads or reading books or watching movies in malls where smoking is not allowed  Vanish off the things which reminds you about smoking for example match box, or your favorite lighter, or the pipe you used for smoking, or your favorite jeans and shirt with which you used to enjoy smoking, or the club where you used to do smoking  Try to avoid certain people places and incidences where and with whom smoking is a common factor to add on  Praise yourself with some token gifts from the money you saved by stopping smoking  Anti Smoking teams are there to help you. Join  their programs  Anti-smoking Gums are there in many medical shops. Try them to quit smoking   Side-effects of Smoking: Disease caused by smoking cigarettes are emphysema, bronchitis, heart failures  Premature death  Cancer is the major side effect of smoking  Heart attacks and strokes are the quick effects of smoking causing sudden death  Some smokers lives end up with limbs amputated  Breathing problem or fast breathing is another side effect of smoking  Due to more intakes of smokes, carbon mono-oxide goes into your brain and other muscles of the body which leads to swelling of the veins and blockage to the air passage to lungs  Carbon monoxide blocks blood vessels which leads to blockage in the flow of blood to different major body organs like heart lungs and thus leads to attacks and deaths  During pregnancy smoking is very harmful and leads to premature birth of the infant, spontaneous abortions, low weight of the infant during birth  Fat depositions to narrow and blocked blood vessels causing heart attacks  In many cases cigarette smoking caused infertility in men

## 2011-09-10 NOTE — Assessment & Plan Note (Signed)
Medication compliance addressed. Commitment to regular exercise and healthy  food choices, with portion control discussed. DASH diet and low fat diet discussed and literature offered. Changes in medication made at this visit.  

## 2011-09-11 LAB — HEPATIC FUNCTION PANEL
ALT: 11 U/L (ref 0–35)
AST: 17 U/L (ref 0–37)
Albumin: 4.2 g/dL (ref 3.5–5.2)
Alkaline Phosphatase: 51 U/L (ref 39–117)
Total Bilirubin: 0.5 mg/dL (ref 0.3–1.2)

## 2011-09-11 LAB — BASIC METABOLIC PANEL
Chloride: 109 mEq/L (ref 96–112)
Glucose, Bld: 81 mg/dL (ref 70–99)
Potassium: 4.5 mEq/L (ref 3.5–5.3)
Sodium: 143 mEq/L (ref 135–145)

## 2011-09-13 NOTE — Assessment & Plan Note (Signed)
Pt has cut down some and ants to quit, plan is to gradually reduce dependence

## 2011-09-13 NOTE — Assessment & Plan Note (Signed)
Controlled, no change in medication  

## 2011-09-13 NOTE — Assessment & Plan Note (Signed)
Improved. Pt applauded on succesful weight loss through lifestyle change, and encouraged to continue same. Weight loss goal set for the next several months.  

## 2011-09-13 NOTE — Progress Notes (Signed)
  Subjective:    Patient ID: Lydia Perez, female    DOB: Oct 16, 1956, 55 y.o.   MRN: [id number]  HPI The PT is here for follow up and re-evaluation of chronic medical conditions, medication management and review of any available recent lab and radiology data.  Preventive health is updated, specifically  Cancer screening and Immunization.   Questions or concerns regarding consultations or procedures which the PT has had in the interim are  addressed. The PT denies any adverse reactions to current medications since the last visit.  There are no new concerns.  Denies symptoms of depression, poor impulse control or anxiety, has changed her work environment , states this is a big help, also recently divorced and plans to re marry in the next several months. Has cutback on nicotine and actually wants to quit      Review of Systems See HPI Denies recent fever or chills. Denies sinus pressure, nasal congestion, ear pain or sore throat. Denies chest congestion, productive cough or wheezing. Denies chest pains, palpitations and leg swelling Denies abdominal pain, nausea, vomiting,diarrhea or constipation.   Denies dysuria, frequency, hesitancy or incontinence. Denies joint pain, swelling and limitation in mobility. Denies headaches, seizures, numbness, or tingling. Denies depression, anxiety or insomnia. Denies skin break down or rash.        Objective:   Physical Exam Patient alert and oriented and in no cardiopulmonary distress.  HEENT: No facial asymmetry, EOMI, no sinus tenderness,  oropharynx pink and moist.  Neck supple no adenopathy.  Chest: Clear to auscultation bilaterally.Decreased throughout  CVS: S1, S2 no murmurs, no S3.  ABD: Soft non tender. Bowel sounds normal.  Ext: No edema  MS: Adequate ROM spine, shoulders, hips and knees.  Skin: Intact, no ulcerations or rash noted.  Psych: Good eye contact, normal affect. Memory intact not anxious or depressed  appearing.  CNS: CN 2-12 intact, power, tone and sensation normal throughout.        Assessment & Plan:

## 2011-09-14 ENCOUNTER — Telehealth: Payer: Self-pay | Admitting: Family Medicine

## 2011-09-14 MED ORDER — PANTOPRAZOLE SODIUM 40 MG PO TBEC: DELAYED_RELEASE_TABLET | ORAL | Status: DC

## 2011-09-14 NOTE — Telephone Encounter (Signed)
Only med that started with "P" sent (which was GERD med not BP)

## 2011-09-18 ENCOUNTER — Other Ambulatory Visit: Payer: Self-pay

## 2011-09-18 DIAGNOSIS — I10 Essential (primary) hypertension: Secondary | ICD-10-CM

## 2011-09-18 MED ORDER — TRIAMTERENE-HCTZ 37.5-25 MG PO TABS: 1.0000 | ORAL_TABLET | Freq: Every day | ORAL | Status: DC

## 2011-09-18 MED ORDER — PANTOPRAZOLE SODIUM 40 MG PO TBEC: DELAYED_RELEASE_TABLET | ORAL | Status: DC

## 2011-12-14 ENCOUNTER — Encounter: Payer: Self-pay | Admitting: Family Medicine

## 2011-12-15 ENCOUNTER — Other Ambulatory Visit (HOSPITAL_COMMUNITY)
Admission: RE | Admit: 2011-12-15 | Discharge: 2011-12-15 | Disposition: A | Discharge status: Home / Self Care | Payer: PRIVATE HEALTH INSURANCE | Source: Ambulatory Visit | Attending: Family Medicine | Admitting: Family Medicine

## 2011-12-15 ENCOUNTER — Ambulatory Visit (INDEPENDENT_AMBULATORY_CARE_PROVIDER_SITE_OTHER): Payer: PRIVATE HEALTH INSURANCE | Admitting: Family Medicine

## 2011-12-15 ENCOUNTER — Encounter: Payer: Self-pay | Admitting: Family Medicine

## 2011-12-15 VITALS — BP 150/96 | HR 75 | Resp 16 | Ht 64.0 in | Wt 175.0 lb

## 2011-12-15 DIAGNOSIS — K625 Hemorrhage of anus and rectum: Secondary | ICD-10-CM

## 2011-12-15 DIAGNOSIS — Z01419 Encounter for gynecological examination (general) (routine) without abnormal findings: Secondary | ICD-10-CM | POA: Insufficient documentation

## 2011-12-15 DIAGNOSIS — Z13220 Encounter for screening for lipoid disorders: Secondary | ICD-10-CM

## 2011-12-15 DIAGNOSIS — Z0000 Encounter for general adult medical examination without abnormal findings: Secondary | ICD-10-CM

## 2011-12-15 DIAGNOSIS — J209 Acute bronchitis, unspecified: Secondary | ICD-10-CM

## 2011-12-15 DIAGNOSIS — E663 Overweight: Secondary | ICD-10-CM

## 2011-12-15 DIAGNOSIS — E559 Vitamin D deficiency, unspecified: Secondary | ICD-10-CM

## 2011-12-15 DIAGNOSIS — R7301 Impaired fasting glucose: Secondary | ICD-10-CM

## 2011-12-15 DIAGNOSIS — Z1211 Encounter for screening for malignant neoplasm of colon: Secondary | ICD-10-CM

## 2011-12-15 DIAGNOSIS — I10 Essential (primary) hypertension: Secondary | ICD-10-CM

## 2011-12-15 DIAGNOSIS — F17200 Nicotine dependence, unspecified, uncomplicated: Secondary | ICD-10-CM

## 2011-12-15 DIAGNOSIS — R5381 Other malaise: Secondary | ICD-10-CM

## 2011-12-15 MED ORDER — HYDROCORTISONE ACE-PRAMOXINE 1-1 % RE CREA: TOPICAL_CREAM | Freq: Two times a day (BID) | RECTAL | Status: AC

## 2011-12-15 MED ORDER — BENZONATATE 100 MG PO CAPS: 100.0000 mg | ORAL_CAPSULE | Freq: Four times a day (QID) | ORAL | Status: DC | PRN

## 2011-12-15 MED ORDER — AMLODIPINE BESYLATE 5 MG PO TABS: 5.0000 mg | ORAL_TABLET | Freq: Every day | ORAL | Status: DC

## 2011-12-15 MED ORDER — AZITHROMYCIN 250 MG PO TABS: ORAL_TABLET | ORAL | Status: AC

## 2011-12-15 NOTE — Progress Notes (Signed)
  Subjective:    Patient ID: Lydia Perez, female    DOB: [birth year], 56 y.o.   MRN: [id number]  HPI The PT is here for annual exam and re-evaluation of chronic medical conditions, medication management and review of any available recent lab and radiology data.  Preventive health is updated, specifically  Cancer screening and Immunization.   Questions or concerns regarding consultations or procedures which the PT has had in the interim are  addressed. The PT denies any adverse reactions to current medications since the last visit.  3 day h/o head and chest congestion, occasional chills, sputum and drainage are yellow.' Rectal prolapse with anal pressure x 1 month, states she has changed her job, climbing stairs for approx 3 month     Review of Systems See HPI Denies chest pains, palpitations and leg swelling Denies abdominal pain, nausea, vomiting,diarrhea or constipation.   Denies dysuria, frequency, hesitancy or incontinence. Denies joint pain, swelling and limitation in mobility. Denies headaches, seizures, numbness, or tingling. Denies depression, anxiety or insomnia. Denies skin break down or rash.        Objective:   Physical Exam  Pleasant well nourished female, alert and oriented x 3, in no cardio-pulmonary distress. Afebrile. HEENT No facial trauma or asymetry. Sinuses  tender.  EOMI, PERTL, fundoscopic exam is normal, no hemorhage or exudate.  External ears normal, tympanic membranes clear. Oropharynx moist, no exudate, poor dentition. Neck: supple, no adenopathy,JVD or thyromegaly.No bruits.  Chest: Decreased air entry, scattered crackles and few wheezes Non tender to palpation  Breast: No asymetry,no masses. No nipple discharge or inversion. No axillary or supraclavicular adenopathy  Cardiovascular system; Heart sounds normal,  S1 and  S2 ,no S3.  No murmur, or thrill. Apical beat not displaced Peripheral pulses normal.  Abdomen: Soft, non tender,  no organomegaly or masses. No bruits. Bowel sounds normal. No guarding, tenderness or rebound.  Rectal:  No mass. Guaiac negative stool.  GU: External genitalia normal. No lesions. Vaginal canal normal.No discharge. Uterus absent   no adnexal masses, no  adnexal tenderness.  Musculoskeletal exam: Full ROM of spine, hips , shoulders and knees. No deformity ,swelling or crepitus noted. No muscle wasting or atrophy.   Neurologic: Cranial nerves 2 to 12 intact. Power, tone ,sensation and reflexes normal throughout. No disturbance in gait. No tremor.  Skin: Intact, no ulceration, erythema , scaling or rash noted. Pigmentation normal throughout  Psych; Normal mood and affect. Judgement and concentration normal       Assessment & Plan:

## 2011-12-15 NOTE — Patient Instructions (Signed)
F/u in 6 to 8 week  New medication for blood pressure is amlodipine, stop triamterene. You should not need to go to the bathroom as often with this.  A cream is prescribed to help with rectal bleeding presumed to be from hemorhoid. If this continues you will need to see GI. Use oTc stool softeners colace 1 twice daily so you do not strain at stool  It is important that you exercise regularly at least 30 minutes 5 times a week. If you develop chest pain, have severe difficulty breathing, or feel very tired, stop exercising immediately and seek medical attention  A healthy diet is rich in fruit, vegetables and whole grains. Poultry fish, nuts and beans are a healthy choice for protein rather then red meat. A low sodium diet and drinking 64 ounces of water daily is generally recommended. Oils and sweet should be limited. Carbohydrates especially for those who are diabetic or overweight, should be limited to 34-45 gram per meal. It is important to eat on a regular schedule, at least 3 times daily. Snacks should be primarily fruits, vegetables or nuts.  Fasting labs 1 week before next visit

## 2011-12-20 NOTE — Assessment & Plan Note (Signed)
C/o recurrent rectal bleeding with hemorhoid which I do not appreciate, topical cream prescribed

## 2011-12-20 NOTE — Assessment & Plan Note (Signed)
Unchanged, counseled to quit 

## 2011-12-20 NOTE — Assessment & Plan Note (Signed)
Uncontrolled, change in med

## 2011-12-20 NOTE — Assessment & Plan Note (Signed)
unchanged Patient re-educated about  the importance of commitment to a  minimum of 150 minutes of exercise per week. The importance of healthy food choices with portion control discussed. Encouraged to start a food diary, count calories and to consider  joining a support group. Sample diet sheets offered. Goals set by the patient for the next several months.    

## 2012-02-02 ENCOUNTER — Ambulatory Visit (INDEPENDENT_AMBULATORY_CARE_PROVIDER_SITE_OTHER): Payer: PRIVATE HEALTH INSURANCE | Admitting: Family Medicine

## 2012-02-02 ENCOUNTER — Encounter: Payer: Self-pay | Admitting: Family Medicine

## 2012-02-02 VITALS — BP 128/84 | HR 63 | Resp 18 | Ht 64.0 in | Wt 178.0 lb

## 2012-02-02 DIAGNOSIS — I10 Essential (primary) hypertension: Secondary | ICD-10-CM

## 2012-02-02 DIAGNOSIS — K625 Hemorrhage of anus and rectum: Secondary | ICD-10-CM

## 2012-02-02 DIAGNOSIS — J209 Acute bronchitis, unspecified: Secondary | ICD-10-CM

## 2012-02-02 DIAGNOSIS — F17200 Nicotine dependence, unspecified, uncomplicated: Secondary | ICD-10-CM

## 2012-02-02 LAB — VITAMIN D 25 HYDROXY (VIT D DEFICIENCY, FRACTURES): Vit D, 25-Hydroxy: 10 ng/mL — ABNORMAL LOW (ref 30–89)

## 2012-02-02 LAB — HEMOGLOBIN A1C: Mean Plasma Glucose: 123 mg/dL — ABNORMAL HIGH (ref <117)

## 2012-02-02 LAB — CBC WITH DIFFERENTIAL/PLATELET
Basophils Absolute: 0 10*3/uL (ref 0.0–0.1)
Basophils Relative: 0 % (ref 0–1)
Eosinophils Relative: 2 % (ref 0–5)
HCT: 43.5 % (ref 36.0–46.0)
MCHC: 32 g/dL (ref 30.0–36.0)
MCV: 96.5 fL (ref 78.0–100.0)
Monocytes Absolute: 0.5 10*3/uL (ref 0.1–1.0)
Neutro Abs: 2.8 10*3/uL (ref 1.7–7.7)
RDW: 13.5 % (ref 11.5–15.5)

## 2012-02-02 LAB — TSH: TSH: 1.675 u[IU]/mL (ref 0.350–4.500)

## 2012-02-02 LAB — BASIC METABOLIC PANEL
Calcium: 9.2 mg/dL (ref 8.4–10.5)
Creat: 0.9 mg/dL (ref 0.50–1.10)

## 2012-02-02 LAB — LIPID PANEL: LDL Cholesterol: 112 mg/dL — ABNORMAL HIGH (ref 0–99)

## 2012-02-02 NOTE — Progress Notes (Signed)
  Subjective:    Patient ID: Lydia Perez, female    DOB: 01-21-1956, 56 y.o.   MRN: [id number]  HPI The PT is here for follow up of acute bronchitis and uncontrolled hypertension and re-evaluation of chronic medical conditions, medication management and review of any available recent lab and radiology data.  Preventive health is updated, specifically  Cancer screening and Immunization.    The PT denies any adverse reactions to current medications since the last visit.  There are no new concerns.  There are no specific complaints . She is proud to state that she has actually stopped smoking     Review of Systems See HPI Denies recent fever or chills. Denies sinus pressure, nasal congestion, ear pain or sore throat. Denies chest congestion, productive cough or wheezing. Denies chest pains, palpitations and leg swelling Denies abdominal pain, nausea, vomiting,diarrhea or constipation.   Denies dysuria, frequency, hesitancy or incontinence. Denies joint pain, swelling and limitation in mobility. Denies headaches, seizures, numbness, or tingling. Denies depression, anxiety or insomnia. Denies skin break down or rash.       Objective:   Physical Exam Patient alert and oriented and in no cardiopulmonary distress.  HEENT: No facial asymmetry, EOMI, no sinus tenderness,  oropharynx pink and moist.  Neck supple no adenopathy.  Chest: Clear to auscultation bilaterally.Decreased throughout  CVS: S1, S2 no murmurs, no S3.  ABD: Soft non tender. Bowel sounds normal.  Ext: No edema  MS: Adequate ROM spine, shoulders, hips and knees.  Skin: Intact, no ulcerations or rash noted.  Psych: Good eye contact, normal affect. Memory intact not anxious or depressed appearing.  CNS: CN 2-12 intact, power, tone and sensation normal throughout.        Assessment & Plan:

## 2012-02-02 NOTE — Patient Instructions (Signed)
F/u august.  Please reconsider gI evaluation for single episode of rectal bleeding  congrats on smoking cessation.  It is important that you exercise regularly at least 30 minutes 5 times a week. If you develop chest pain, have severe difficulty breathing, or feel very tired, stop exercising immediately and seek medical attention    A healthy diet is rich in fruit, vegetables and whole grains. Poultry fish, nuts and beans are a healthy choice for protein rather then red meat. A low sodium diet and drinking 64 ounces of water daily is generally recommended. Oils and sweet should be limited. Carbohydrates especially for those who are diabetic or overweight, should be limited to 34-45 gram per meal. It is important to eat on a regular schedule, at least 3 times daily. Snacks should be primarily fruits, vegetables or nuts.   Weight loss goal of 8 to 10 pounds  bP is at goal

## 2012-02-07 NOTE — Assessment & Plan Note (Signed)
Single episode, pt not interested in GI eval at this time

## 2012-02-07 NOTE — Assessment & Plan Note (Signed)
Asymptomatic, and has quit smoking!

## 2012-02-07 NOTE — Assessment & Plan Note (Signed)
Controlled, no change in medication  

## 2012-02-07 NOTE — Assessment & Plan Note (Signed)
quit

## 2012-02-16 ENCOUNTER — Encounter: Payer: Self-pay | Admitting: Family Medicine

## 2012-06-30 ENCOUNTER — Encounter: Payer: Self-pay | Admitting: Family Medicine

## 2012-06-30 ENCOUNTER — Ambulatory Visit (INDEPENDENT_AMBULATORY_CARE_PROVIDER_SITE_OTHER): Payer: PRIVATE HEALTH INSURANCE | Admitting: Family Medicine

## 2012-06-30 VITALS — BP 120/84 | HR 75 | Resp 16 | Ht 64.0 in | Wt 180.0 lb

## 2012-06-30 DIAGNOSIS — E663 Overweight: Secondary | ICD-10-CM

## 2012-06-30 DIAGNOSIS — K219 Gastro-esophageal reflux disease without esophagitis: Secondary | ICD-10-CM

## 2012-06-30 DIAGNOSIS — R1013 Epigastric pain: Secondary | ICD-10-CM

## 2012-06-30 DIAGNOSIS — I10 Essential (primary) hypertension: Secondary | ICD-10-CM

## 2012-06-30 DIAGNOSIS — Z569 Unspecified problems related to employment: Secondary | ICD-10-CM

## 2012-06-30 DIAGNOSIS — K3189 Other diseases of stomach and duodenum: Secondary | ICD-10-CM

## 2012-06-30 DIAGNOSIS — F329 Major depressive disorder, single episode, unspecified: Secondary | ICD-10-CM

## 2012-06-30 DIAGNOSIS — Z566 Other physical and mental strain related to work: Secondary | ICD-10-CM | POA: Insufficient documentation

## 2012-06-30 MED ORDER — QUETIAPINE FUMARATE ER 150 MG PO TB24: 150.0000 mg | ORAL_TABLET | Freq: Every day | ORAL | Status: DC

## 2012-06-30 MED ORDER — ESOMEPRAZOLE MAGNESIUM 20 MG PO CPDR: 20.0000 mg | DELAYED_RELEASE_CAPSULE | Freq: Every day | ORAL | Status: DC

## 2012-06-30 NOTE — Assessment & Plan Note (Signed)
Uncontrolled and worsened, has benefited i the past from mood stabilizer will resume same, also offered therapy which she refused, but t will commit to regular physical activity

## 2012-06-30 NOTE — Assessment & Plan Note (Signed)
Uncontrolled 

## 2012-06-30 NOTE — Patient Instructions (Addendum)
F/u in 2.5 month  You are restarted on nexium since this is more effective than protonix for use. Cutting back on caffeine will also be beneficial   Please try and organize your life outside of work so that  Life improves for you. You will also resume seroquel  Start daily physical activity to help with stress also

## 2012-06-30 NOTE — Assessment & Plan Note (Signed)
Controlled, no change in medication DASH diet and commitment to daily physical activity for a minimum of 30 minutes discussed and encouraged, as a part of hypertension management. The importance of attaining a healthy weight is also discussed.  

## 2012-06-30 NOTE — Assessment & Plan Note (Signed)
Currently uncontrolled, resume nexium

## 2012-06-30 NOTE — Progress Notes (Signed)
  Subjective:    Patient ID: Lydia Perez, female    DOB: [birth year], 56 y.o.   MRN: [id number]  HPI The PT is here for follow up and re-evaluation of chronic medical conditions, medication management and review of any available recent lab and radiology data.  Preventive health is updated, specifically  Cancer screening and Immunization.   Questions or concerns regarding consultations or procedures which the PT has had in the interim are  addressed. The PT denies any adverse reactions to current medications since the last visit.  2 month h/o increased and uncontrolled anger and anxiety, espescialy with regard to work, states at times as though she could hirt someone. Has been on medication in the past for mood instability and feels as though she needs this again. Depression score is 5, she is not actively homicidal or suicidal. Has gained weight due to poor eating habits and no regular exercise     Review of Systems See HPI Denies recent fever or chills. Denies sinus pressure, nasal congestion, ear pain or sore throat. Denies chest congestion, productive cough or wheezing. Denies chest pains, palpitations and leg swelling C/o uncontrolled reflux symptoms, states better on nexium and wants this back  Denies dysuria, frequency, hesitancy or incontinence. Denies joint pain, swelling and limitation in mobility. Denies headaches, seizures, numbness, or tingling. Denies skin break down or rash.        Objective:   Physical Exam  Patient alert and oriented and in no cardiopulmonary distress.  HEENT: No facial asymmetry, EOMI, no sinus tenderness,  oropharynx pink and moist.  Neck supple no adenopathy.  Chest: Clear to auscultation bilaterally.  CVS: S1, S2 no murmurs, no S3.  ABD: Soft non tender. Bowel sounds normal.  Ext: No edema  MS: Adequate ROM spine, shoulders, hips and knees.  Skin: Intact, no ulcerations or rash noted.  Psych: Good eye contact, normal affect.  Memory intact  anxious and tearing up at times during the interview, angry about work, not depressed  CNS: CN 2-12 intact, power, tone and sensation normal throughout.       Assessment & Plan:

## 2012-06-30 NOTE — Assessment & Plan Note (Signed)
Deteriorated. Patient re-educated about  the importance of commitment to a  minimum of 150 minutes of exercise per week. The importance of healthy food choices with portion control discussed. Encouraged to start a food diary, count calories and to consider  joining a support group. Sample diet sheets offered. Goals set by the patient for the next several months.    

## 2012-08-19 ENCOUNTER — Encounter: Payer: Self-pay | Admitting: Family Medicine

## 2012-08-19 ENCOUNTER — Ambulatory Visit (INDEPENDENT_AMBULATORY_CARE_PROVIDER_SITE_OTHER): Payer: PRIVATE HEALTH INSURANCE | Admitting: Family Medicine

## 2012-08-19 VITALS — BP 130/70 | HR 68 | Temp 99.1°F | Resp 18 | Ht 64.0 in | Wt 178.1 lb

## 2012-08-19 DIAGNOSIS — F329 Major depressive disorder, single episode, unspecified: Secondary | ICD-10-CM

## 2012-08-19 DIAGNOSIS — Z131 Encounter for screening for diabetes mellitus: Secondary | ICD-10-CM

## 2012-08-19 DIAGNOSIS — J209 Acute bronchitis, unspecified: Secondary | ICD-10-CM

## 2012-08-19 DIAGNOSIS — I10 Essential (primary) hypertension: Secondary | ICD-10-CM

## 2012-08-19 DIAGNOSIS — E663 Overweight: Secondary | ICD-10-CM

## 2012-08-19 LAB — BASIC METABOLIC PANEL
Calcium: 8.9 mg/dL (ref 8.4–10.5)
Sodium: 141 mEq/L (ref 135–145)

## 2012-08-19 LAB — HEMOGLOBIN A1C: Hgb A1c MFr Bld: 5.6 % (ref <5.7)

## 2012-08-19 MED ORDER — CEFTRIAXONE SODIUM 1 G IJ SOLR
500.0000 mg | Freq: Once | INTRAMUSCULAR | Status: AC
  Administered 2012-08-19: 500 mg via INTRAMUSCULAR

## 2012-08-19 MED ORDER — IPRATROPIUM BROMIDE 0.02 % IN SOLN
0.5000 mg | Freq: Once | RESPIRATORY_TRACT | Status: AC
  Administered 2012-08-19: 0.5 mg via RESPIRATORY_TRACT

## 2012-08-19 MED ORDER — BENZONATATE 100 MG PO CAPS: 100.0000 mg | ORAL_CAPSULE | Freq: Four times a day (QID) | ORAL | Status: DC | PRN

## 2012-08-19 MED ORDER — METHYLPREDNISOLONE ACETATE 80 MG/ML IJ SUSP
80.0000 mg | Freq: Once | INTRAMUSCULAR | Status: AC
  Administered 2012-08-19: 80 mg via INTRAMUSCULAR

## 2012-08-19 MED ORDER — ALBUTEROL SULFATE (5 MG/ML) 0.5% IN NEBU
2.5000 mg | INHALATION_SOLUTION | Freq: Once | RESPIRATORY_TRACT | Status: AC
  Administered 2012-08-19: 2.5 mg via RESPIRATORY_TRACT

## 2012-08-19 MED ORDER — KETOROLAC TROMETHAMINE 60 MG/2ML IJ SOLN
60.0000 mg | Freq: Once | INTRAMUSCULAR | Status: AC
  Administered 2012-08-19: 60 mg via INTRAMUSCULAR

## 2012-08-19 MED ORDER — PREDNISONE (PAK) 5 MG PO TABS: 5.0000 mg | ORAL_TABLET | ORAL | Status: DC

## 2012-08-19 MED ORDER — FLUCONAZOLE 150 MG PO TABS: ORAL_TABLET | ORAL | Status: DC

## 2012-08-19 MED ORDER — SULFAMETHOXAZOLE-TRIMETHOPRIM 800-160 MG PO TABS: 1.0000 | ORAL_TABLET | Freq: Two times a day (BID) | ORAL | Status: AC

## 2012-08-19 MED ORDER — PROMETHAZINE-DM 6.25-15 MG/5ML PO SYRP: ORAL_SOLUTION | ORAL | Status: DC

## 2012-08-19 NOTE — Patient Instructions (Addendum)
CPE early February.Please call if you need me before  You have acute bronchitis. Neb treatment, rocephin, toradol and depomedrol administered in the office.  Medication is sent to your pharmacy.Drink a lot of water and get rest. I hope that you feel better soon  Please make appt for flu vaccine 08/31/2012  hBA1C and chem 7 today.  It is important that you exercise regularly at least 30 minutes 5 times a week. If you develop chest pain, have severe difficulty breathing, or feel very tired, stop exercising immediately and seek medical attention    A healthy diet is rich in fruit, vegetables and whole grains. Poultry fish, nuts and beans are a healthy choice for protein rather then red meat. A low sodium diet and drinking 64 ounces of water daily is generally recommended. Oils and sweet should be limited. Carbohydrates especially for those who are diabetic or overweight, should be limited to 30-45 gram per meal. It is important to eat on a regular schedule, at least 3 times daily. Snacks should be primarily fruits, vegetables or nuts.

## 2012-08-19 NOTE — Assessment & Plan Note (Signed)
Unchanged. Patient re-educated about  the importance of commitment to a  minimum of 150 minutes of exercise per week. The importance of healthy food choices with portion control discussed. Encouraged to start a food diary, count calories and to consider  joining a support group. Sample diet sheets offered. Goals set by the patient for the next several months.    

## 2012-08-19 NOTE — Assessment & Plan Note (Signed)
Acute bronchitis which is worsening, aggressive treatment started in office

## 2012-08-19 NOTE — Assessment & Plan Note (Signed)
Controlled, no change in medication  

## 2012-08-19 NOTE — Progress Notes (Signed)
  Subjective:    Patient ID: Lydia Perez, female    DOB: [birth year], 56 y.o.   MRN: [id number]  HPI 1 week h/o worsening head and chest congestion, deep cough, non productive, dry and hacking, chest pain and soreness from excessive cough, stuffy sinuses, which are painful with sore throat and post nasal drainage. C/o wheezing Has had intermittent fever and chills, feels weak   Review of Systems See HPI Denies  palpitations and leg swelling Denies abdominal pain, nausea, vomiting,diarrhea or constipation.   Denies dysuria, frequency, hesitancy or incontinence. Denies joint pain, swelling and limitation in mobility. Denies headaches, seizures, numbness, or tingling. Denies depression, anxiety or insomnia. Denies skin break down or rash.        Objective:   Physical Exam Patient alert and oriented and in mild  cardiopulmonary distress.  HEENT: No facial asymmetry, EOMI, no sinus tenderness,  oropharynx erythematous and moist.  Neck supple anterior cervical  adenopathy.  Chest: decreased air entry, bilateral crackles.few scattered wheezes  CVS: S1, S2 no murmurs, no S3.  ABD: Soft non tender. Bowel sounds normal.  Ext: No edema  MS: Adequate ROM spine, shoulders, hips and knees.  Skin: Intact, no ulcerations or rash noted.  Psych: Good eye contact, normal affect. Memory intact not anxious or depressed appearing.  CNS: CN 2-12 intact, power, tone and sensation normal throughout.        Assessment & Plan:

## 2012-08-19 NOTE — Assessment & Plan Note (Signed)
Controlled, no change in medication DASH diet and commitment to daily physical activity for a minimum of 30 minutes discussed and encouraged, as a part of hypertension management. The importance of attaining a healthy weight is also discussed.  

## 2012-10-03 ENCOUNTER — Ambulatory Visit: Payer: PRIVATE HEALTH INSURANCE | Admitting: Family Medicine

## 2012-12-23 ENCOUNTER — Ambulatory Visit (INDEPENDENT_AMBULATORY_CARE_PROVIDER_SITE_OTHER): Payer: PRIVATE HEALTH INSURANCE | Admitting: Family Medicine

## 2012-12-23 ENCOUNTER — Other Ambulatory Visit (HOSPITAL_COMMUNITY)
Admission: RE | Admit: 2012-12-23 | Discharge: 2012-12-23 | Disposition: A | Discharge status: Home / Self Care | Payer: 59 | Source: Ambulatory Visit | Attending: Family Medicine | Admitting: Family Medicine

## 2012-12-23 ENCOUNTER — Encounter: Payer: Self-pay | Admitting: Family Medicine

## 2012-12-23 VITALS — BP 160/82 | HR 70 | Resp 18 | Ht 64.0 in | Wt 175.1 lb

## 2012-12-23 DIAGNOSIS — Z13220 Encounter for screening for lipoid disorders: Secondary | ICD-10-CM

## 2012-12-23 DIAGNOSIS — R198 Other specified symptoms and signs involving the digestive system and abdomen: Secondary | ICD-10-CM

## 2012-12-23 DIAGNOSIS — F411 Generalized anxiety disorder: Secondary | ICD-10-CM

## 2012-12-23 DIAGNOSIS — I10 Essential (primary) hypertension: Secondary | ICD-10-CM

## 2012-12-23 DIAGNOSIS — F419 Anxiety disorder, unspecified: Secondary | ICD-10-CM | POA: Insufficient documentation

## 2012-12-23 DIAGNOSIS — Z1151 Encounter for screening for human papillomavirus (HPV): Secondary | ICD-10-CM | POA: Insufficient documentation

## 2012-12-23 DIAGNOSIS — K625 Hemorrhage of anus and rectum: Secondary | ICD-10-CM

## 2012-12-23 DIAGNOSIS — Z0000 Encounter for general adult medical examination without abnormal findings: Secondary | ICD-10-CM | POA: Insufficient documentation

## 2012-12-23 DIAGNOSIS — R1013 Epigastric pain: Secondary | ICD-10-CM

## 2012-12-23 DIAGNOSIS — F17200 Nicotine dependence, unspecified, uncomplicated: Secondary | ICD-10-CM

## 2012-12-23 DIAGNOSIS — Z01419 Encounter for gynecological examination (general) (routine) without abnormal findings: Secondary | ICD-10-CM | POA: Insufficient documentation

## 2012-12-23 DIAGNOSIS — Z1211 Encounter for screening for malignant neoplasm of colon: Secondary | ICD-10-CM

## 2012-12-23 DIAGNOSIS — Z131 Encounter for screening for diabetes mellitus: Secondary | ICD-10-CM

## 2012-12-23 DIAGNOSIS — K3189 Other diseases of stomach and duodenum: Secondary | ICD-10-CM

## 2012-12-23 LAB — POC HEMOCCULT BLD/STL (OFFICE/1-CARD/DIAGNOSTIC)

## 2012-12-23 MED ORDER — AMLODIPINE BESYLATE 5 MG PO TABS: 5.0000 mg | ORAL_TABLET | Freq: Every day | ORAL | Status: DC

## 2012-12-23 MED ORDER — BUSPIRONE HCL 5 MG PO TABS: 5.0000 mg | ORAL_TABLET | Freq: Two times a day (BID) | ORAL | Status: DC

## 2012-12-23 MED ORDER — ESOMEPRAZOLE MAGNESIUM 20 MG PO CPDR: 20.0000 mg | DELAYED_RELEASE_CAPSULE | Freq: Every day | ORAL | Status: DC

## 2012-12-23 NOTE — Progress Notes (Signed)
  Subjective:    Patient ID: Lydia Perez, female    DOB: 03/05/1956, 57 y.o.   MRN: [id number]  HPI The PT is here for annual exam, follow up of chronic problems and review  of any available recent lab and radiology data.  Preventive health is updated, specifically  Cancer screening and Immunization.   4 month h/o change in bowel movements, constipation with passage of little balls of stool Also has had intermittent rectal bleeding, none since October. Also c/o uncontrolled anxiety, which she states is job related Still smoking but needs to quit , willing to set quit date, for May when her daugghter is getting married      Review of Systems See HPI Denies recent fever or chills. Denies sinus pressure, nasal congestion, ear pain or sore throat. Denies chest congestion, productive cough or wheezing. Denies chest pains, palpitations and leg swelling Denies abdominal pain, nausea, vomiting Denies dysuria, frequency, hesitancy or incontinence. Denies joint pain, swelling and limitation in mobility. Denies headaches, seizures, numbness, or tingling.  Denies skin break down or rash.        Objective:   Physical Exam  Pleasant well nourished female, alert and oriented x 3, in no cardio-pulmonary distress. Afebrile. HEENT No facial trauma or asymetry. Sinuses non tender.  EOMI, PERTL, fundoscopic exam is normal, no hemorhage or exudate.  External ears normal, tympanic membranes clear. Oropharynx moist, no exudate, fair dentition. Neck: supple, no adenopathy,JVD or thyromegaly.No bruits.  Chest: Clear to ascultation bilaterally.No crackles or wheezes.Decreased though adequate air entry Non tender to palpation  Breast: No asymetry,no masses. No nipple discharge or inversion. No axillary or supraclavicular adenopathy  Cardiovascular system; Heart sounds normal,  S1 and  S2 ,no S3.  No murmur, or thrill. Apical beat not displaced Peripheral pulses  normal.  Abdomen: Soft, non tender, no organomegaly or masses. No bruits. Bowel sounds normal. No guarding, tenderness or rebound.  Rectal:  No mass. Guaiac negative stool.  GU: External genitalia normal. No lesions. Vaginal canal normal.No discharge. Uterus absent, no adnexal masses, no  adnexal tenderness.  Musculoskeletal exam: Full ROM of spine, hips , shoulders and knees. No deformity ,swelling or crepitus noted. No muscle wasting or atrophy.   Neurologic: Cranial nerves 2 to 12 intact. Power, tone ,sensation and reflexes normal throughout. No disturbance in gait. No tremor.  Skin: Intact, no ulceration, erythema , scaling or rash noted. Pigmentation normal throughout  Psych; Normal mood and affect. Judgement and concentration normal       Assessment & Plan:

## 2012-12-23 NOTE — Patient Instructions (Addendum)
F/u in 4 month  You are referred to Dr Darrick Penna due to c/o change in bowel movements, it is very important you keep this appointment  You need to set a quit date and KEEP it for smoking cessation to improve your health and reduce risk of disease   New additional medication , to be taken for anxiety, buspar twice daily, continue seroquel as before  Blood pressure high, it is vital you take medication prescribed daily  It is important that you exercise regularly at least 30 minutes 5 times a week. If you develop chest pain, have severe difficulty breathing, or feel very tired, stop exercising immediately and seek medical attention   A healthy diet is rich in fruit, vegetables and whole grains. Poultry fish, nuts and beans are a healthy choice for protein rather then red meat. A low sodium diet and drinking 64 ounces of water daily is generally recommended. Oils and sweet should be limited. Carbohydrates especially for those who are diabetic or overweight, should be limited to 34-45 gram per meal. It is important to eat on a regular schedule, at least 3 times daily. Snacks should be primarily fruits, vegetables or nuts.  Fasting labs asap, cbc, chem 7, lipid , tsh and HBA1C

## 2012-12-24 DIAGNOSIS — F17200 Nicotine dependence, unspecified, uncomplicated: Secondary | ICD-10-CM | POA: Insufficient documentation

## 2012-12-24 NOTE — Assessment & Plan Note (Signed)
Intermittent rectal bleeding .  New onset change in bowel movements, refer to GI

## 2012-12-24 NOTE — Assessment & Plan Note (Signed)
Uncontrolled.Pt currently out of meds DASH diet and commitment to daily physical activity for a minimum of 30 minutes discussed and encouraged, as a part of hypertension management. The importance of attaining a healthy weight is also discussed.

## 2012-12-24 NOTE — Assessment & Plan Note (Signed)
Pt has continued smoking   Patient counseled for approximately 5 minutes regarding the health risks of ongoing nicotine use, specifically all types of cancer, heart disease, stroke and respiratory failure. The options available for help with cessation ,the behavioral changes to assist the process, and the option to either gradully reduce usage  Or abruptly stop.is also discussed. Pt is also encouraged to set specific goals in number of cigarettes used daily, as well as to set a quit date.

## 2012-12-27 ENCOUNTER — Encounter: Payer: Self-pay | Admitting: Gastroenterology

## 2012-12-28 ENCOUNTER — Other Ambulatory Visit: Payer: Self-pay | Admitting: Gastroenterology

## 2012-12-28 ENCOUNTER — Encounter: Payer: Self-pay | Admitting: Gastroenterology

## 2012-12-28 ENCOUNTER — Ambulatory Visit (INDEPENDENT_AMBULATORY_CARE_PROVIDER_SITE_OTHER): Payer: PRIVATE HEALTH INSURANCE | Admitting: Gastroenterology

## 2012-12-28 VITALS — BP 133/87 | HR 98 | Temp 98.0°F | Ht 66.0 in | Wt 176.6 lb

## 2012-12-28 DIAGNOSIS — K219 Gastro-esophageal reflux disease without esophagitis: Secondary | ICD-10-CM

## 2012-12-28 DIAGNOSIS — K625 Hemorrhage of anus and rectum: Secondary | ICD-10-CM

## 2012-12-28 DIAGNOSIS — R1314 Dysphagia, pharyngoesophageal phase: Secondary | ICD-10-CM

## 2012-12-28 DIAGNOSIS — R1310 Dysphagia, unspecified: Secondary | ICD-10-CM

## 2012-12-28 MED ORDER — SOD PICOSULFATE-MAG OX-CIT ACD 10-3.5-12 MG-GM-GM PO PACK: 1.0000 | PACK | ORAL | Status: DC

## 2012-12-28 NOTE — Assessment & Plan Note (Signed)
SX CONTROLLED.  CONTINUE NEXIUM. OPV IN 3 MOS

## 2012-12-28 NOTE — Patient Instructions (Addendum)
UPPER ENDOSCOPY WITH DILATION FEB 17.  COLONOSCOPY WITH POSSIBLE HEMORRHOID BANDING FEB 17.  FOLLOW A HIGH FIBER DIET. SEE INFO BELOW.  FOLLOW UP IN 3 MOS.   High-Fiber Diet A high-fiber diet changes your normal diet to include more whole grains, legumes, fruits, and vegetables. Changes in the diet involve replacing refined carbohydrates with unrefined foods. The calorie level of the diet is essentially unchanged. The Dietary Reference Intake (recommended amount) for adult males is 38 grams per day. For adult females, it is 25 grams per day. Pregnant and lactating women should consume 28 grams of fiber per day. Fiber is the intact part of a plant that is not broken down during digestion. Functional fiber is fiber that has been isolated from the plant to provide a beneficial effect in the body. PURPOSE  Increase stool bulk.   Ease and regulate bowel movements.   Lower cholesterol.  INDICATIONS THAT YOU NEED MORE FIBER  Constipation and hemorrhoids.   Uncomplicated diverticulosis (intestine condition) and irritable bowel syndrome.   Weight management.   As a protective measure against hardening of the arteries (atherosclerosis), diabetes, and cancer.   GUIDELINES FOR INCREASING FIBER IN THE DIET  Start adding fiber to the diet slowly. A gradual increase of about 5 more grams (2 slices of whole-wheat bread, 2 servings of most fruits or vegetables, or 1 bowl of high-fiber cereal) per day is best. Too rapid an increase in fiber may result in constipation, flatulence, and bloating.   Drink enough water and fluids to keep your urine clear or pale yellow. Water, juice, or caffeine-free drinks are recommended. Not drinking enough fluid may cause constipation.   Eat a variety of high-fiber foods rather than one type of fiber.   Try to increase your intake of fiber through using high-fiber foods rather than fiber pills or supplements that contain small amounts of fiber.   The goal is to  change the types of food eaten. Do not supplement your present diet with high-fiber foods, but replace foods in your present diet.  INCLUDE A VARIETY OF FIBER SOURCES  Replace refined and processed grains with whole grains, canned fruits with fresh fruits, and incorporate other fiber sources. White rice, white breads, and most bakery goods contain little or no fiber.   Brown whole-grain rice, buckwheat oats, and many fruits and vegetables are all good sources of fiber. These include: broccoli, Brussels sprouts, cabbage, cauliflower, beets, sweet potatoes, white potatoes (skin on), carrots, tomatoes, eggplant, squash, berries, fresh fruits, and dried fruits.   Cereals appear to be the richest source of fiber. Cereal fiber is found in whole grains and bran. Bran is the fiber-rich outer coat of cereal grain, which is largely removed in refining. In whole-grain cereals, the bran remains. In breakfast cereals, the largest amount of fiber is found in those with "bran" in their names. The fiber content is sometimes indicated on the label.   You may need to include additional fruits and vegetables each day.   In baking, for 1 cup white flour, you may use the following substitutions:   1 cup whole-wheat flour minus 2 tablespoons.   1/2 cup white flour plus 1/2 cup whole-wheat flour.

## 2012-12-28 NOTE — Assessment & Plan Note (Addendum)
TO SOLID FOOD. MOST LIKELY DUE TO STRICTURE, LESS LIKELY ESOPHAGEAL/GASTRIC CA.  EGD/DIL FEB 17.

## 2012-12-28 NOTE — Progress Notes (Signed)
Faxed to PCP

## 2012-12-28 NOTE — Progress Notes (Signed)
Subjective:    Patient ID: Lydia Perez, female    DOB: 11-Jul-1956, 57 y.o.   MRN: [id number]  PCP: [facility]  HPI STARTED SEEING BRBPR DEC 2013. SEEING IT AFTER HAVING BM AND SHE WIPED. FIRST TIME IT WAS A LOT. WASN'T CONSTIPATED. WORKS AT [employer] AND FLOORS [employer](CARRIES THINGS [facility] [place] DOWN THE STEPS. DRAGGING HOSES. SANITATION). JUST MOVED TO A NEW JOB-~1 YEAR AGO. BEFORE ONLY HAD TO DO ONE FLOOR. RARE ABD PAIN UNLESS ITS TIME TO GO TH BR. BMs: every day and now goes twice a week. When she goes she has a #3/4 stool. Sometimes feels like food gets stuck.  PT DENIES FEVER, CHILLS, nausea, vomiting, melena, diarrhea, abd pain, problems with sedation, heartburn or indigestion.  Past Medical History  Diagnosis Date  . Anemia     NOS   . Hiatal hernia   . Bipolar disorder 2010    dx by PCP   . History of cardiac cath 2002  . Esophageal stricture    Past Surgical History  Procedure Laterality Date  . Total abdominal hysterectomy w/ bilateral salpingoophorectomy  01/2005  . Tubal ligation    . Esophagogastroduodenoscopy  07/19/2007    SLF: Normal  . Colonoscopy  08/13/2008    SLF: SIMPLE ADENOMA/HYPERPLASTIC POLYP    Allergies  Allergen Reactions  . Codeine     Current Outpatient Prescriptions  Medication Sig Dispense Refill  . amLODipine (NORVASC) 5 MG tablet Take 1 tablet (5 mg total) by mouth daily.  30 tablet  11  . busPIRone (BUSPAR) 5 MG tablet Take 1 tablet (5 mg total) by mouth 2 (two) times daily.  60 tablet  4  . calcium-vitamin D (OSCAL 500/200 D-3) 500 MG tablet Take 1 tablet by mouth 3 (three) times daily. One tablet by mouth three time a day       . esomeprazole (NEXIUM) 20 MG capsule Take 1 capsule (20 mg total) by mouth daily.  30 capsule  5  . naproxen sodium (ALEVE) 220 MG tablet Take 220 mg by mouth as needed. Pain      . QUEtiapine Fumarate (SEROQUEL XR) 150 MG 24 hr tablet Take 150 mg by mouth at bedtime.       No current facility-administered  medications for this visit.    Family History  Problem Relation Age of Onset  . Emphysema Father   . Kidney disease Mother   . Heart disease Mother     1st by pass in her 87's   . Heart failure Mother   . Diabetes Mother   . Hypertension Mother   . Heart attack Brother   . Heart failure Brother   . Colon cancer Neg Hx   . Colon polyps Neg Hx    History  Substance Use Topics  . Smoking status: Current Every Day Smoker -- 0.50 packs/day for 4 years    Types: Cigarettes    Last Attempt to Quit: 01/19/2012  . Smokeless tobacco: Never Used  . Alcohol Use: No     Review of Systems     Objective:   Physical Exam  Vitals reviewed. Constitutional: She is oriented to person, place, and time. She appears well-nourished. No distress.  HENT:  Head: Normocephalic and atraumatic.  Mouth/Throat: Oropharynx is clear and moist.  Eyes: Pupils are equal, round, and reactive to light. No scleral icterus.  Neck: Normal range of motion. Neck supple.  Cardiovascular: Normal rate, regular rhythm and normal heart sounds.   Pulmonary/Chest: Effort normal  and breath sounds normal. No respiratory distress.  Abdominal: Soft. Bowel sounds are normal. She exhibits no distension. There is tenderness (MID PERIUMBILICAL TTP). There is no rebound and no guarding.  Musculoskeletal: She exhibits no edema.  Lymphadenopathy:    She has no cervical adenopathy.  Neurological: She is alert and oriented to person, place, and time.  NO FOCAL DEFICITS   Psychiatric: She has a normal mood and affect.          Assessment & Plan:

## 2012-12-28 NOTE — Assessment & Plan Note (Addendum)
MOST LIKELY DUE TO HEMORRHOIDS, BUT ASSOCIATED WITH CHANGE IN BOWEL HABITS. PT HAS LOW LIKELIHOOD OF COLON CA OR COLONIC AVMs.  TCS WITH PREPOPIK FEB 17. SCHEDULE OPV AFTER TCS. MAY NEED OPV IN 2 WEEKS IF HEMORRHOIDS BANDING PERFORMED OR 3 MOS.

## 2013-01-02 ENCOUNTER — Encounter (HOSPITAL_COMMUNITY): Admission: RE | Payer: Self-pay | Source: Ambulatory Visit

## 2013-01-02 ENCOUNTER — Telehealth: Payer: Self-pay | Admitting: Gastroenterology

## 2013-01-02 ENCOUNTER — Ambulatory Visit (HOSPITAL_COMMUNITY): Admission: RE | Admit: 2013-01-02 | Payer: 59 | Source: Ambulatory Visit | Admitting: Gastroenterology

## 2013-01-02 SURGERY — COLONOSCOPY
Anesthesia: Moderate Sedation

## 2013-01-02 NOTE — Telephone Encounter (Signed)
REVIEWED.  

## 2013-01-02 NOTE — Telephone Encounter (Signed)
Pt LMOM over the weekend that she is sick and will need to cancel her procedure for today. [phone]

## 2013-01-02 NOTE — Telephone Encounter (Signed)
Procedure has been cancelled with Endo

## 2013-01-03 ENCOUNTER — Encounter: Payer: Self-pay | Admitting: Family Medicine

## 2013-01-03 ENCOUNTER — Telehealth: Payer: Self-pay | Admitting: Family Medicine

## 2013-01-03 ENCOUNTER — Ambulatory Visit (HOSPITAL_COMMUNITY)
Admission: RE | Admit: 2013-01-03 | Discharge: 2013-01-03 | Disposition: A | Discharge status: Home / Self Care | Payer: 59 | Source: Ambulatory Visit | Attending: Family Medicine | Admitting: Family Medicine

## 2013-01-03 ENCOUNTER — Ambulatory Visit (INDEPENDENT_AMBULATORY_CARE_PROVIDER_SITE_OTHER): Payer: 59 | Admitting: Family Medicine

## 2013-01-03 VITALS — BP 122/78 | HR 70 | Temp 98.6°F | Resp 18 | Ht 64.0 in | Wt 176.1 lb

## 2013-01-03 DIAGNOSIS — I10 Essential (primary) hypertension: Secondary | ICD-10-CM

## 2013-01-03 DIAGNOSIS — R918 Other nonspecific abnormal finding of lung field: Secondary | ICD-10-CM

## 2013-01-03 DIAGNOSIS — F17200 Nicotine dependence, unspecified, uncomplicated: Secondary | ICD-10-CM

## 2013-01-03 DIAGNOSIS — R05 Cough: Secondary | ICD-10-CM | POA: Insufficient documentation

## 2013-01-03 DIAGNOSIS — J111 Influenza due to unidentified influenza virus with other respiratory manifestations: Secondary | ICD-10-CM

## 2013-01-03 DIAGNOSIS — R9389 Abnormal findings on diagnostic imaging of other specified body structures: Secondary | ICD-10-CM

## 2013-01-03 DIAGNOSIS — R509 Fever, unspecified: Secondary | ICD-10-CM | POA: Insufficient documentation

## 2013-01-03 DIAGNOSIS — J209 Acute bronchitis, unspecified: Secondary | ICD-10-CM

## 2013-01-03 DIAGNOSIS — M940 Chondrocostal junction syndrome [Tietze]: Secondary | ICD-10-CM

## 2013-01-03 DIAGNOSIS — R059 Cough, unspecified: Secondary | ICD-10-CM | POA: Insufficient documentation

## 2013-01-03 MED ORDER — ALBUTEROL SULFATE (5 MG/ML) 0.5% IN NEBU
2.5000 mg | INHALATION_SOLUTION | Freq: Once | RESPIRATORY_TRACT | Status: AC
  Administered 2013-01-03: 2.5 mg via RESPIRATORY_TRACT

## 2013-01-03 MED ORDER — KETOROLAC TROMETHAMINE 60 MG/2ML IJ SOLN
60.0000 mg | Freq: Once | INTRAMUSCULAR | Status: AC
  Administered 2013-01-03: 60 mg via INTRAMUSCULAR

## 2013-01-03 MED ORDER — CEFTRIAXONE SODIUM 1 G IJ SOLR
500.0000 mg | Freq: Once | INTRAMUSCULAR | Status: AC
  Administered 2013-01-03: 500 mg via INTRAMUSCULAR

## 2013-01-03 MED ORDER — OSELTAMIVIR PHOSPHATE 75 MG PO CAPS: 75.0000 mg | ORAL_CAPSULE | Freq: Two times a day (BID) | ORAL | Status: DC

## 2013-01-03 MED ORDER — IPRATROPIUM BROMIDE 0.02 % IN SOLN
0.5000 mg | Freq: Once | RESPIRATORY_TRACT | Status: AC
  Administered 2013-01-03: 0.5 mg via RESPIRATORY_TRACT

## 2013-01-03 MED ORDER — BENZONATATE 100 MG PO CAPS: 100.0000 mg | ORAL_CAPSULE | Freq: Four times a day (QID) | ORAL | Status: DC | PRN

## 2013-01-03 MED ORDER — IBUPROFEN 800 MG PO TABS: 800.0000 mg | ORAL_TABLET | Freq: Three times a day (TID) | ORAL | Status: DC | PRN

## 2013-01-03 MED ORDER — DOXYCYCLINE HYCLATE 100 MG PO TABS: 100.0000 mg | ORAL_TABLET | Freq: Two times a day (BID) | ORAL | Status: AC

## 2013-01-03 NOTE — Patient Instructions (Addendum)
F/u as before, call if you need me before  You are being treated for acute bronchitis and influenza  Neb treatment, and Rocephin in officeis , doxycycline and decongestants sent to office.  You need to quit smoking.  Toradol in office for chest wall pain, ibuprofen sent in for generalized pain and chest wall pain  cXR today, order already in  CBC and diff today   Work excuse start 2/18 return  01/09/2013

## 2013-01-03 NOTE — Progress Notes (Signed)
  Subjective:    Patient ID: Lydia Perez, female    DOB: May 13, 1956, 57 y.o.   MRN: [id number]  HPI 1 week h/o fatigue cough , chills, generalized body aches, light headed at times, though she has been working feels unable to continue without adequate treatment. C/o sore throat chest wall pain with cough and headache with cough and poor sleep still smoking and no commitment to quitting at this time  Review of Systems See HPI  Denies sinus pressure, nasal congestion, ear pain or sore throat.  Denies chest pains, palpitations and leg swelling Denies abdominal pain, nausea, vomiting,diarrhea or constipation.   Denies dysuria, frequency, hesitancy or incontinence. C/o chest wall pain aggravated by cough and direct pressure C/o  Headaches from excessive cough, denies seizures, numbness, or tingling. Denies depression, anxiety or insomnia. Denies skin break down or rash.        Objective:   Physical Exam  Patient alert and oriented and in no cardiopulmonary distress.Ill appearing  HEENT: No facial asymmetry, EOMI, no sinus tenderness,  oropharynx pink and moist.No eythema or exudate  Neck supple no adenopathy.  Chest: decreased air entry, scattered crackles and wheezes. Tender to palpation over 3rd and 4th CC junctions on left  CVS: S1, S2 no murmurs, no S3.  ABD: Soft non tender. Bowel sounds normal.  Ext: No edema  MS: Adequate ROM spine, shoulders, hips and knees.  Skin: Intact, no ulcerations or rash noted.  Psych: Good eye contact, normal affect. Memory intact not anxious or depressed appearing.  CNS: CN 2-12 intact, power, tone and sensation normal throughout.       Assessment & Plan:

## 2013-01-03 NOTE — Assessment & Plan Note (Addendum)
toradol 60 mg IM in office, and anti inflammatories prescribed

## 2013-01-04 ENCOUNTER — Ambulatory Visit (HOSPITAL_COMMUNITY)
Admission: RE | Admit: 2013-01-04 | Discharge: 2013-01-04 | Disposition: A | Discharge status: Home / Self Care | Payer: 59 | Source: Ambulatory Visit | Attending: Family Medicine | Admitting: Family Medicine

## 2013-01-04 DIAGNOSIS — F17200 Nicotine dependence, unspecified, uncomplicated: Secondary | ICD-10-CM | POA: Insufficient documentation

## 2013-01-04 DIAGNOSIS — R918 Other nonspecific abnormal finding of lung field: Secondary | ICD-10-CM | POA: Insufficient documentation

## 2013-01-04 DIAGNOSIS — R9389 Abnormal findings on diagnostic imaging of other specified body structures: Secondary | ICD-10-CM

## 2013-01-04 LAB — CBC WITH DIFFERENTIAL/PLATELET
Eosinophils Absolute: 0 10*3/uL (ref 0.0–0.7)
Hemoglobin: 13.6 g/dL (ref 12.0–15.0)
Lymphocytes Relative: 64 % — ABNORMAL HIGH (ref 12–46)
Lymphs Abs: 2.6 10*3/uL (ref 0.7–4.0)
Monocytes Relative: 10 % (ref 3–12)
Neutro Abs: 0.9 10*3/uL — ABNORMAL LOW (ref 1.7–7.7)
Neutrophils Relative %: 22 % — ABNORMAL LOW (ref 43–77)
Platelets: 170 10*3/uL (ref 150–400)
RBC: 4.36 MIL/uL (ref 3.87–5.11)
WBC: 4 10*3/uL (ref 4.0–10.5)

## 2013-01-04 NOTE — Telephone Encounter (Signed)
Patient is aware 

## 2013-01-08 NOTE — Assessment & Plan Note (Signed)
Neb treatment and antibiotics in office and prescribed

## 2013-01-08 NOTE — Assessment & Plan Note (Signed)
Ongoing smoking, cessation counseling done, no quit date set Patient counseled for approximately 5 minutes regarding the health risks of ongoing nicotine use, specifically all types of cancer, heart disease, stroke and respiratory failure. The options available for help with cessation ,the behavioral changes to assist the process, and the option to either gradully reduce usage  Or abruptly stop.is also discussed. Pt is also encouraged to set specific goals in number of cigarettes used daily, as well as to set a quit date.

## 2013-01-08 NOTE — Assessment & Plan Note (Signed)
Acute symptoms of generalized body aces , fever, chills , sore throat , presumptive treatment for influenza, and work excuse

## 2013-01-08 NOTE — Assessment & Plan Note (Signed)
Controlled, no change in medication DASH diet and commitment to daily physical activity for a minimum of 30 minutes discussed and encouraged, as a part of hypertension management. The importance of attaining a healthy weight is also discussed.  

## 2013-01-09 ENCOUNTER — Other Ambulatory Visit: Payer: Self-pay | Admitting: Family Medicine

## 2013-01-09 ENCOUNTER — Telehealth: Payer: Self-pay | Admitting: Family Medicine

## 2013-01-09 DIAGNOSIS — R918 Other nonspecific abnormal finding of lung field: Secondary | ICD-10-CM

## 2013-01-09 NOTE — Telephone Encounter (Signed)
pls refer pt to pulmonary Doc  Adolph Pollack, preferably Dr Maple Hudson evaluate and treat abn chest CT scan with multiple pulmonary nodules, h/o nicotine, recently treated for bronchitis. Also cancel chest CT scan I ordered will let pulmonary specialist be responsible for tests

## 2013-01-09 NOTE — Telephone Encounter (Signed)
Are her papers done?

## 2013-01-11 ENCOUNTER — Encounter: Payer: Self-pay | Admitting: Family Medicine

## 2013-01-11 ENCOUNTER — Ambulatory Visit (INDEPENDENT_AMBULATORY_CARE_PROVIDER_SITE_OTHER): Payer: 59 | Admitting: Family Medicine

## 2013-01-11 VITALS — BP 120/88 | HR 58 | Resp 16 | Ht 64.0 in | Wt 176.0 lb

## 2013-01-11 DIAGNOSIS — H1032 Unspecified acute conjunctivitis, left eye: Secondary | ICD-10-CM

## 2013-01-11 DIAGNOSIS — R918 Other nonspecific abnormal finding of lung field: Secondary | ICD-10-CM

## 2013-01-11 DIAGNOSIS — I10 Essential (primary) hypertension: Secondary | ICD-10-CM

## 2013-01-11 DIAGNOSIS — H1030 Unspecified acute conjunctivitis, unspecified eye: Secondary | ICD-10-CM

## 2013-01-11 NOTE — Progress Notes (Signed)
  Subjective:    Patient ID: CASANDRA DALLAIRE, female    DOB: [birth year], 57 y.o.   MRN: [id number]  HPI  Right eye redness, itching and discomfort, acutely started prior to returning to work. No drainage from the eye.Irrtation actually started on th skin on the lower medial aspect of the eye, area is red and irritated. C/o painful knot on left lower jaw since this started Pt states she just started the doxycycline prescribed yesterday, she had been taking penicillin she had at home bedfore, reportedly. States she did take the tamiflu, this was to treat her acute bronchitis with a markedly abnormal chest CT scan. From a respiratory standpoint she feels much better, but understands the need and intends to take the entire course of doxycycline prescribed. She is also going to be referred for an  Earlier appt to see pulmonary specialist based on chest CT report  Review of Systems See HPI  Denies chest pains, palpitations and leg swelling Denies abdominal pain, nausea, vomiting,diarrhea or constipation.   Denies dysuria, frequency, hesitancy or incontinence. Denies joint pain, swelling and limitation in mobility. Denies headaches, seizures, numbness, or tingling. C/o anxiety over health, with abnormal chest scan Denies skin break down or rash.        Objective:   Physical Exam Patient alert and oriented and in no cardiopulmonary distress.  HEENT: No facial asymmetry, EOMI, no sinus tenderness,  oropharynx pink and moist.  Neck supple no adenopathy.EOME, left conjunctiva erythematous, erythema and rash of skin on left lower inner lid  Chest: adequate air entry bilaterally, no wheezes or crackles heard  CVS: S1, S2 no murmurs, no S3.  ABD: Soft non tender. Bowel sounds normal.  Ext: No edema  MS: Adequate ROM spine, shoulders, hips and knees.  Skin: Intact, erythema and rash on skin inner aspect of left eye  Psych: Good eye contact, normal affect. Memory intact  anxious   CNS: CN  2-12 intact, power, tone and sensation normal throughout.        Assessment & Plan:

## 2013-01-11 NOTE — Patient Instructions (Signed)
F/u  As before.  You are referred to opthalmology asap re red right eye. You need to be out of work until this is better  I will get a sooner appointment to pulmonary specialist for you

## 2013-01-12 NOTE — Assessment & Plan Note (Signed)
Controlled, no change in medication  

## 2013-01-12 NOTE — Assessment & Plan Note (Signed)
Acute conjunctivitis left eye, however rash on skin preceeded conjunctivitis historically, will have opthalmology further eval

## 2013-01-12 NOTE — Assessment & Plan Note (Signed)
Abnormal chest CT , more excesssive pathology than seen clinically...querry sarcoidosis or other pathology than infectious etiology, sooner appt with pulmonary specialist to be obtained

## 2013-01-25 ENCOUNTER — Ambulatory Visit (INDEPENDENT_AMBULATORY_CARE_PROVIDER_SITE_OTHER): Payer: 59 | Admitting: Internal Medicine

## 2013-01-25 ENCOUNTER — Encounter: Payer: Self-pay | Admitting: Internal Medicine

## 2013-01-25 VITALS — BP 132/90 | HR 63 | Temp 97.8°F | Ht 64.0 in | Wt 180.0 lb

## 2013-01-25 DIAGNOSIS — J209 Acute bronchitis, unspecified: Secondary | ICD-10-CM

## 2013-01-25 DIAGNOSIS — R918 Other nonspecific abnormal finding of lung field: Secondary | ICD-10-CM

## 2013-01-25 NOTE — Patient Instructions (Addendum)
Please take nexium 40 mg 30-60 minutes before your first meal of the day Please schedule follow up with Dr. Sherene Sires for 4 weeks with a chest x ray,  and call us sooner if needed

## 2013-01-25 NOTE — Progress Notes (Signed)
  Subjective:    Patient ID: Lydia Perez, female    DOB: Jun 02, 1956   MRN: [id number]  HPI  52 yobf quit smoking 01/03/13 with onset of cough and chest tightness early Feb 2014 > Dr Lodema Hong > cxr > pulmonary referral 01/25/2013 for lung nodules- rx'd cough with smoking cessation, doxy   01/25/2013 1st pulmonary eval cc 90% better p above rx by Dr Lodema Hong x for mild sense of congestion / tightness not affected by breathing and cough gone, never productive, more of a pattern of dry hacking, taking nexium after supper controls HB.  No obvious daytime variabilty or assoc chronic cough or cp or chest tightness, subjective wheeze overt sinus or hb symptoms. No unusual exp hx or h/o childhood pna/ asthma or premature birth to her knowledge.   ROS  The following are not active complaints unless bolded sore throat, dysphagia, dental problems, itching, sneezing,  nasal congestion or excess/ purulent secretions, ear ache,   fever, chills, sweats, unintended wt loss, pleuritic or exertional cp, hemoptysis,  orthopnea pnd or leg swelling, presyncope, palpitations, heartburn, abdominal pain, anorexia, nausea, vomiting, diarrhea  or change in bowel or urinary habits, change in stools or urine, dysuria,hematuria,  rash, arthralgias, visual complaints, headache, numbness weakness or ataxia or problems with walking or coordination,  change in mood/affect or memory.      Review of Systems  Constitutional: Negative for fever, chills and unexpected weight change.  HENT: Negative for ear pain, nosebleeds, congestion, sore throat, rhinorrhea, sneezing, trouble swallowing, dental problem, voice change, postnasal drip and sinus pressure.   Eyes: Negative for visual disturbance.  Respiratory: Positive for cough and chest tightness. Negative for choking and shortness of breath.   Cardiovascular: Negative for chest pain and leg swelling.  Gastrointestinal: Negative for vomiting, abdominal pain and diarrhea.   Genitourinary: Negative for difficulty urinating.  Musculoskeletal: Negative for arthralgias.  Skin: Negative for rash.  Neurological: Negative for tremors, syncope and headaches.  Hematological: Does not bruise/bleed easily.       Objective:   Physical Exam  Wt Readings from Last 3 Encounters:  01/25/13 180 lb (81.647 kg)  01/11/13 176 lb (79.833 kg)  01/03/13 176 lb 1.3 oz (79.869 kg)     amb bf nad HEENT: nl dentition, turbinates, and orophanx. Nl external ear canals without cough reflex   NECK :  without JVD/Nodes/TM/ nl carotid upstrokes bilaterally   LUNGS: no acc muscle use, clear to A and P bilaterally without cough on insp or exp maneuvers   CV:  RRR  no s3 or murmur or increase in P2, no edema   ABD:  soft and nontender with nl excursion in the supine position. No bruits or organomegaly, bowel sounds nl  MS:  warm without deformities, calf tenderness, cyanosis or clubbing  SKIN: warm and dry without lesions    NEURO:  alert, approp, no deficits    CT 01/09/13 In the area of the perceived nodular opacity on the  recent chest radiograph there is actually a large cluster of  peribronchovascular nodules centered in the lateral segment of the  right middle lobe. Multiple other scattered peribronchovascular  nodules are also seen throughout the lungs bilaterally, with the  largest collection of these in the posterior aspect of the right  lower lobe. No confluent consolidative airspace disease. No  pleural effusions     Assessment & Plan:

## 2013-01-28 NOTE — Assessment & Plan Note (Signed)
Explained natural history of uri and why it's necessary in patients at risk to treat GERD aggressively  at least  short term   to reduce risk of evolving cyclical cough initially  triggered by epithelial injury and a heightened sensitivty to the effects of any upper airway irritants,  most importantly acid - related.  That is, the more sensitive the epithelium damaged for virus, the more the cough, the more the secondary reflux (especially in those prone to reflux) the more the irritation of the sensitive mucosa and so on in a cyclical pattern.   For now just rec she change the nexium to take it optimally  30-60 min before first meal of the day

## 2013-01-28 NOTE — Assessment & Plan Note (Addendum)
-   Viz on plain cxr  01/03/13 in setting of acute resp illness and consisting of clusters of small nodules c/w inflammatory changes and probably all that's required is f/u plain film now that she' s feeling so much better.  The ddx includes MAI but this doesn't cause such an abrupt illness but could be an incidental cxr finding and have nothing to do with the acute illness she's getting over  Discussed in detail all the  indications, usual  risks and alternatives  relative to the benefits with patient who agrees to proceed with conservative f/u with cxr in  4 weeks, sooner if needed.

## 2013-02-07 ENCOUNTER — Encounter: Payer: Self-pay | Admitting: Gastroenterology

## 2013-02-07 NOTE — Progress Notes (Signed)
Pt aware of OV on 5/22 at 9 with SF and appt card was mailed

## 2013-02-16 ENCOUNTER — Institutional Professional Consult (permissible substitution): Payer: 59 | Admitting: Internal Medicine

## 2013-02-22 ENCOUNTER — Ambulatory Visit (INDEPENDENT_AMBULATORY_CARE_PROVIDER_SITE_OTHER)
Admission: RE | Admit: 2013-02-22 | Discharge: 2013-02-22 | Disposition: A | Discharge status: Home / Self Care | Payer: 59 | Source: Ambulatory Visit | Attending: Internal Medicine | Admitting: Internal Medicine

## 2013-02-22 ENCOUNTER — Ambulatory Visit (INDEPENDENT_AMBULATORY_CARE_PROVIDER_SITE_OTHER): Payer: 59 | Admitting: Internal Medicine

## 2013-02-22 ENCOUNTER — Encounter: Payer: Self-pay | Admitting: Internal Medicine

## 2013-02-22 VITALS — BP 128/92 | HR 58 | Temp 97.1°F | Ht 64.0 in | Wt 182.4 lb

## 2013-02-22 DIAGNOSIS — R918 Other nonspecific abnormal finding of lung field: Secondary | ICD-10-CM

## 2013-02-22 NOTE — Assessment & Plan Note (Signed)
-   Viz on plain cxr  01/03/13 in setting of acute resp illness > see CT  01/09/13 - Resolved by plain cxr 02/22/2013   Cxr clearly resolved nodular changes likely inflammatory/ infectious with all symptoms resolved p rx and maintaining off cigarettes so pulmonary f/u can be prn

## 2013-02-22 NOTE — Patient Instructions (Addendum)
If you are satisfied with your treatment plan let your doctor know and he/she can either refill your medications or you can return here when your prescription runs out.     If in any way you are not 100% satisfied,  please tell us.  If 100% better, tell your friends!  

## 2013-02-22 NOTE — Progress Notes (Signed)
  Subjective:    Patient ID: Lydia Perez, female    DOB: 1955-11-20   MRN: [id number]    Brief patient profile:  56 yobf quit smoking 01/03/13 with onset of cough and chest tightness early Feb 2014 > Dr Lodema Hong > cxr > pulmonary referral 01/25/2013 for lung nodules- rx'd cough with smoking cessation, doxy  HPI 01/25/2013 1st pulmonary eval cc 90% better p above rx by Dr Lodema Hong x for mild sense of congestion / tightness not affected by breathing and cough gone, never productive, more of a pattern of dry hacking, taking nexium after supper controls HB. rec Please take nexium 40 mg 30-60 minutes before your first meal of the day   02/22/2013 f/u ov/Lydia Perez f/u ? pna Chief Complaint  Patient presents with  . Follow-up    Pt. had cxr today, pt. denies any chest tightness, wheezing or sob.   100% improved,maintaining off cigarettes.  No obvious daytime variabilty or assoc chronic cough or cp or chest tightness, subjective wheeze overt sinus or hb symptoms. No unusual exp hx or h/o childhood pna/ asthma or premature birth to her knowledge.   ROS  The following are not active complaints unless bolded sore throat, dysphagia, dental problems, itching, sneezing,  nasal congestion or excess/ purulent secretions, ear ache,   fever, chills, sweats, unintended wt loss, pleuritic or exertional cp, hemoptysis,  orthopnea pnd or leg swelling, presyncope, palpitations, heartburn, abdominal pain, anorexia, nausea, vomiting, diarrhea  or change in bowel or urinary habits, change in stools or urine, dysuria,hematuria,  rash, arthralgias, visual complaints, headache, numbness weakness or ataxia or problems with walking or coordination,  change in mood/affect or memory.           Objective:   Physical Exam  02/22/2013     182  Wt Readings from Last 3 Encounters:  01/25/13 180 lb (81.647 kg)  01/11/13 176 lb (79.833 kg)  01/03/13 176 lb 1.3 oz (79.869 kg)     amb bf nad HEENT: nl dentition, turbinates,  and orophanx. Nl external ear canals without cough reflex   NECK :  without JVD/Nodes/TM/ nl carotid upstrokes bilaterally   LUNGS: no acc muscle use, clear to A and P bilaterally without cough on insp or exp maneuvers   CV:  RRR  no s3 or murmur or increase in P2, no edema   ABD:  soft and nontender with nl excursion in the supine position. No bruits or organomegaly, bowel sounds nl  MS:  warm without deformities, calf tenderness, cyanosis or clubbing  SKIN: warm and dry without lesions    NEURO:  alert, approp, no deficits    CXR  02/22/2013 :  No active disease.       Assessment & Plan:

## 2013-04-06 ENCOUNTER — Other Ambulatory Visit: Payer: Self-pay | Admitting: Gastroenterology

## 2013-04-06 ENCOUNTER — Ambulatory Visit (INDEPENDENT_AMBULATORY_CARE_PROVIDER_SITE_OTHER): Payer: 59 | Admitting: Gastroenterology

## 2013-04-06 ENCOUNTER — Encounter: Payer: Self-pay | Admitting: Gastroenterology

## 2013-04-06 VITALS — BP 120/76 | HR 63 | Temp 98.4°F | Ht 64.0 in | Wt 182.6 lb

## 2013-04-06 DIAGNOSIS — R1310 Dysphagia, unspecified: Secondary | ICD-10-CM

## 2013-04-06 DIAGNOSIS — R1314 Dysphagia, pharyngoesophageal phase: Secondary | ICD-10-CM

## 2013-04-06 DIAGNOSIS — K625 Hemorrhage of anus and rectum: Secondary | ICD-10-CM

## 2013-04-06 DIAGNOSIS — K219 Gastro-esophageal reflux disease without esophagitis: Secondary | ICD-10-CM

## 2013-04-06 MED ORDER — PEG 3350-KCL-NA BICARB-NACL 420 G PO SOLR: 4000.0000 mL | ORAL | Status: DC

## 2013-04-06 NOTE — Assessment & Plan Note (Addendum)
IMPROVES BUT FLARES WITH CONSTIPATION. SX OF GRADE II HEMORRHOIDS-BRBPR LESS LIKELY DUE TO COLON POLYP/CA   ADD FIBER DRINK WATER DO NOT SIT ON COMMODE FOR > 5 MINS TCS/IH BANDING JUN 23. DISCUSSED BENEFITS, RISKS, AND ALTERNATIVES TO PROCEDURE. PT VOICED UNDERSTANDING. OPV IN JUL 2014

## 2013-04-06 NOTE — Progress Notes (Signed)
Reminder in epic °

## 2013-04-06 NOTE — Patient Instructions (Addendum)
ADD BENEFIBER 1 TBSP WITH MEALS THREE TIMES A DAY.  DO NOT SIT ON THE TOILET FOR MORE THAN 5 MINS IF POSSIBLE.  DRINK WATER TO KEEP YOUR URINE LIGHT YELLOW.  FOLLOW A HIGH FIBER DIET. SEE INFO BELOW.   UPPER/LOWER ENDOSCOPY JUN 23  FOLLOW UP IN JUL 2014 FOR BANDING FOLLOW UP APPT AND OCT 2014 FOR ROUTINE VISIT\    HEMORRHOIDAL BANDING COMPLICATIONS:  COMMON: 1. MINOR PAIN  UNCOMMON: 1. ABSCESS 2. BAND FALLS OFF 3. PROLAPSE OF HEMORRHOIDS AND PAIN 4. ULCER BLEEDING  A. USUALLY SELF-LIMITED: MAY LAST 3-5 DAYS  B. MAY REQUIRE INTERVENTION: 1-2 WEEKS AFTER INTERACTIONS 5. NECROTIZING PELVIC SEPSIS  A. SYMPTOMS: FEVER, PAIN, DIFFICULTY URINATING   Constipation in Adults Constipation is having fewer than 2 bowel movements per week. Usually, the stools are hard. As we grow older, constipation is more common. If you try to fix constipation with laxatives, the problem may get worse. This is because laxatives taken over a long period of time make the colon muscles weaker. A low-fiber diet, not taking in enough fluids, and taking some medicines may make these problems worse. MEDICATIONS THAT MAY CAUSE CONSTIPATION  Water pills (diuretics).  Calcium channel blockers (used to control blood pressure and for the heart).   Certain pain medicines (narcotics).   Anticholinergics.  Anti-inflammatory agents.   Antacids that contain aluminum.    HOME CARE INSTRUCTIONS  Constipation is usually best cared for without medicines. Increasing dietary fiber and eating more fruits and vegetables is the best way to manage constipation.   Slowly increase fiber intake to 25 to 38 grams per day. Whole grains, fruits, vegetables, and legumes are good sources of fiber. A dietitian can further help you incorporate high-fiber foods into your diet.   Drink enough water and fluids to keep your urine clear or pale yellow.   A fiber supplement may be added to your diet if you cannot get enough fiber from  foods.   Increasing your activities also helps improve regularity.   Stronger measures, such as magnesium sulfate, should be avoided if possible. This may cause uncontrollable diarrhea. Using magnesium sulfate may not allow you time to make it to the bathroom.      High-Fiber Diet A high-fiber diet changes your normal diet to include more whole grains, legumes, fruits, and vegetables. Changes in the diet involve replacing refined carbohydrates with unrefined foods. The calorie level of the diet is essentially unchanged. The Dietary Reference Intake (recommended amount) for adult males is 38 grams per day. For adult females, it is 25 grams per day. Pregnant and lactating women should consume 28 grams of fiber per day. Fiber is the intact part of a plant that is not broken down during digestion. Functional fiber is fiber that has been isolated from the plant to provide a beneficial effect in the body. PURPOSE  Increase stool bulk.   Ease and regulate bowel movements.   Lower cholesterol.  INDICATIONS THAT YOU NEED MORE FIBER  Constipation and hemorrhoids.   Uncomplicated diverticulosis (intestine condition) and irritable bowel syndrome.   Weight management.   As a protective measure against hardening of the arteries (atherosclerosis), diabetes, and cancer.   GUIDELINES FOR INCREASING FIBER IN THE DIET  Start adding fiber to the diet slowly. A gradual increase of about 5 more grams (2 slices of whole-wheat bread, 2 servings of most fruits or vegetables, or 1 bowl of high-fiber cereal) per day is best. Too rapid an increase in fiber may  result in constipation, flatulence, and bloating.   Drink enough water and fluids to keep your urine clear or pale yellow. Water, juice, or caffeine-free drinks are recommended. Not drinking enough fluid may cause constipation.   Eat a variety of high-fiber foods rather than one type of fiber.   Try to increase your intake of fiber through using  high-fiber foods rather than fiber pills or supplements that contain small amounts of fiber.   The goal is to change the types of food eaten. Do not supplement your present diet with high-fiber foods, but replace foods in your present diet.  INCLUDE A VARIETY OF FIBER SOURCES  Replace refined and processed grains with whole grains, canned fruits with fresh fruits, and incorporate other fiber sources. White rice, white breads, and most bakery goods contain little or no fiber.   Brown whole-grain rice, buckwheat oats, and many fruits and vegetables are all good sources of fiber. These include: broccoli, Brussels sprouts, cabbage, cauliflower, beets, sweet potatoes, white potatoes (skin on), carrots, tomatoes, eggplant, squash, berries, fresh fruits, and dried fruits.   Cereals appear to be the richest source of fiber. Cereal fiber is found in whole grains and bran. Bran is the fiber-rich outer coat of cereal grain, which is largely removed in refining. In whole-grain cereals, the bran remains. In breakfast cereals, the largest amount of fiber is found in those with "bran" in their names. The fiber content is sometimes indicated on the label.   You may need to include additional fruits and vegetables each day.   In baking, for 1 cup white flour, you may use the following substitutions:   1 cup whole-wheat flour minus 2 tablespoons.   1/2 cup white flour plus 1/2 cup whole-wheat flour.

## 2013-04-06 NOTE — Progress Notes (Signed)
Forwarded to PCP.

## 2013-04-06 NOTE — Assessment & Plan Note (Signed)
SOLID DYSPHAGIA. SX NOT IMPROVED.  EGD/DIL June 23 OPV OCT 2014

## 2013-04-06 NOTE — Assessment & Plan Note (Signed)
SX CONTROLLED. 

## 2013-04-06 NOTE — Progress Notes (Signed)
  Subjective:    Patient ID: Lydia Perez, female    DOB: 01-15-1956, 57 y.o.   MRN: [id number]  PCP: [facility]  HPI CANCELLED TCS/EGD FEB 2014 DUE TO ILLNESS. STILL AVING PROBLEMS WITH SOLIDS. NO RECTAL BLEEDING SINCE FEB. MAY HAVE CONSTIPATION: 1-2X/WEEK. EATS FIBER. PT DENIES FEVER, CHILLS, BRBPR, nausea, vomiting, melena, diarrhea, abd pain, problems with sedation, heartburn or indigestion. FEELS SOMETHING FALL OUT AND MAY HAVE TO PUT IT BACK. NO RECTAL ITCHING, PAIN, PRESSURE, OR SOILING.   Past Medical History  Diagnosis Date  . Anemia     NOS   . Hiatal hernia   . Bipolar disorder 2010    dx by PCP   . History of cardiac cath 2002  . Esophageal stricture     Past Surgical History  Procedure Laterality Date  . Total abdominal hysterectomy w/ bilateral salpingoophorectomy  01/2005  . Tubal ligation    . Esophagogastroduodenoscopy  07/19/2007    SLF: Normal  . Colonoscopy  08/13/2008    SLF: SIMPLE ADENOMA/HYPERPLASTIC POLYP   Allergies  Allergen Reactions  . Codeine     Current Outpatient Prescriptions  Medication Sig Dispense Refill  . amLODipine (NORVASC) 5 MG tablet Take 1 tablet (5 mg total) by mouth daily.    . benzonatate (TESSALON PERLES) 100 MG capsule Take 1 capsule (100 mg total) by mouth every 6 (six) hours as needed for cough.    . calcium-vitamin D (OSCAL 500/200 D-3) 500 MG tablet Take 1 tablet by mouth 3 (three) times daily. One tablet by mouth three time a day     . esomeprazole (NEXIUM) 20 MG capsule Take 1 capsule (20 mg total) by mouth daily.    Marland Kitchen ibuprofen (ADVIL,MOTRIN) 800 MG tablet Take 1 tablet (800 mg total) by mouth every 8 (eight) hours as needed for pain.    . naproxen sodium (ALEVE) 220 MG tablet Take 220 mg by mouth as needed. Pain      . QUEtiapine Fumarate (SEROQUEL XR) 150 MG 24 hr tablet Take 150 mg by mouth at bedtime.          Review of Systems     Objective:   Physical Exam  Vitals reviewed. Constitutional: She is oriented  to person, place, and time. She appears well-nourished. No distress.  HENT:  Head: Normocephalic and atraumatic.  Mouth/Throat: Oropharynx is clear and moist. No oropharyngeal exudate.  Eyes: Pupils are equal, round, and reactive to light. No scleral icterus.  Neck: Normal range of motion. Neck supple.  Cardiovascular: Normal rate, regular rhythm and normal heart sounds.   Pulmonary/Chest: Effort normal and breath sounds normal. No respiratory distress.  Abdominal: Soft. Bowel sounds are normal. She exhibits no distension. There is no tenderness.  Musculoskeletal: She exhibits no edema.  Lymphadenopathy:    She has no cervical adenopathy.  Neurological: She is alert and oriented to person, place, and time.  NO FOCAL DEFICITS   Psychiatric: She has a normal mood and affect.          Assessment & Plan:

## 2013-04-21 ENCOUNTER — Encounter: Payer: Self-pay | Admitting: Family Medicine

## 2013-04-21 ENCOUNTER — Ambulatory Visit (INDEPENDENT_AMBULATORY_CARE_PROVIDER_SITE_OTHER): Payer: 59 | Admitting: Family Medicine

## 2013-04-21 VITALS — BP 140/96 | HR 60 | Resp 16 | Ht 64.0 in | Wt 185.0 lb

## 2013-04-21 DIAGNOSIS — E663 Overweight: Secondary | ICD-10-CM

## 2013-04-21 DIAGNOSIS — F329 Major depressive disorder, single episode, unspecified: Secondary | ICD-10-CM

## 2013-04-21 DIAGNOSIS — F411 Generalized anxiety disorder: Secondary | ICD-10-CM

## 2013-04-21 DIAGNOSIS — H1032 Unspecified acute conjunctivitis, left eye: Secondary | ICD-10-CM

## 2013-04-21 DIAGNOSIS — R7301 Impaired fasting glucose: Secondary | ICD-10-CM

## 2013-04-21 DIAGNOSIS — K219 Gastro-esophageal reflux disease without esophagitis: Secondary | ICD-10-CM

## 2013-04-21 DIAGNOSIS — F419 Anxiety disorder, unspecified: Secondary | ICD-10-CM

## 2013-04-21 DIAGNOSIS — I10 Essential (primary) hypertension: Secondary | ICD-10-CM

## 2013-04-21 DIAGNOSIS — M79671 Pain in right foot: Secondary | ICD-10-CM | POA: Insufficient documentation

## 2013-04-21 DIAGNOSIS — H1030 Unspecified acute conjunctivitis, unspecified eye: Secondary | ICD-10-CM

## 2013-04-21 DIAGNOSIS — R5381 Other malaise: Secondary | ICD-10-CM

## 2013-04-21 DIAGNOSIS — R5383 Other fatigue: Secondary | ICD-10-CM

## 2013-04-21 DIAGNOSIS — M79609 Pain in unspecified limb: Secondary | ICD-10-CM

## 2013-04-21 LAB — HEMOGLOBIN A1C: Hgb A1c MFr Bld: 5.9 % — ABNORMAL HIGH (ref <5.7)

## 2013-04-21 LAB — BASIC METABOLIC PANEL
BUN: 13 mg/dL (ref 6–23)
CO2: 25 mEq/L (ref 19–32)
Chloride: 105 mEq/L (ref 96–112)
Creat: 0.88 mg/dL (ref 0.50–1.10)
Glucose, Bld: 91 mg/dL (ref 70–99)

## 2013-04-21 MED ORDER — IBUPROFEN 800 MG PO TABS: 800.0000 mg | ORAL_TABLET | Freq: Three times a day (TID) | ORAL | Status: DC | PRN

## 2013-04-21 MED ORDER — KETOROLAC TROMETHAMINE 60 MG/2ML IM SOLN
60.0000 mg | Freq: Once | INTRAMUSCULAR | Status: AC
  Administered 2013-04-21: 60 mg via INTRAMUSCULAR

## 2013-04-21 MED ORDER — PREDNISONE 5 MG PO TABS: 5.0000 mg | ORAL_TABLET | Freq: Two times a day (BID) | ORAL | Status: DC

## 2013-04-21 MED ORDER — METHYLPREDNISOLONE ACETATE 80 MG/ML IJ SUSP
80.0000 mg | Freq: Once | INTRAMUSCULAR | Status: AC
  Administered 2013-04-21: 80 mg via INTRAMUSCULAR

## 2013-04-21 NOTE — Assessment & Plan Note (Signed)
Resolved completely with no change in vision

## 2013-04-21 NOTE — Patient Instructions (Addendum)
F/u in 4 month, call if you need me before  Congrats on smoking cessation.  Please change eating to enable weight loss, cut out sugar as much as possible also sodas   Lipid, tSH, hBA1C and chem 7 today  Toradol 60mg  and depo medrol 80 mg Im in office today, followed by ibuprofen and prednisone   You are referred to podiatry for further evaluation  Blood pressure is high today, if this persists medication dose will be increased next visit

## 2013-04-21 NOTE — Progress Notes (Signed)
  Subjective:    Patient ID: Lydia Perez, female    DOB: 1956-03-23, 57 y.o.   MRN: [id number]  HPI The PT is here for follow up and re-evaluation of chronic medical conditions, medication management and review of any available recent lab and radiology data.  Preventive health is updated, specifically  Cancer screening and Immunization.   Questions or concerns regarding consultations or procedures which the PT has had in the interim are  Addressed.Has been cleared of pulmonary disease, had re evaluation in April with clear CXR, quit nicotine since then. States vision is back to normal 3 month h/o progressive right heel pain, worse when she first stands, states she stands all day on cement and has to wear steel toe boots and this makes it worse.Also has heel pain when she first starts walking after sitting     Review of Systems See HPI Denies recent fever or chills. Denies sinus pressure, nasal congestion, ear pain or sore throat. Denies chest congestion, productive cough or wheezing. Denies chest pains, palpitations and leg swelling Denies abdominal pain, nausea, vomiting,diarrhea or constipation.   Denies dysuria, frequency, hesitancy or incontinence.  Denies headaches, seizures, numbness, or tingling. Denies uncontrolled depression, anxiety or insomnia. Denies skin break down or rash.        Objective:   Physical Exam  Patient alert and oriented and in no cardiopulmonary distress.  HEENT: No facial asymmetry, EOMI, no sinus tenderness,  oropharynx pink and moist.  Neck supple no adenopathy.  Chest: Clear to auscultation bilaterally.  CVS: S1, S2 no murmurs, no S3.  ABD: Soft non tender. Bowel sounds normal.  Ext: No edema  MS: Adequate ROM spine, shoulders, hips and knees.Tender under right heel, full ROM right ankle, no tenderness under right instep  Skin: Intact, no ulcerations or rash noted.  Psych: Good eye contact, normal affect. Memory intact not anxious  or depressed appearing.  CNS: CN 2-12 intact, power, tone and sensation normal throughout.       Assessment & Plan:

## 2013-04-21 NOTE — Assessment & Plan Note (Signed)
Progressive x several month, currently disabling, toradol and depo medrol administered in the office followed by oral equivalents. Pt also referred to podiatry

## 2013-04-21 NOTE — Assessment & Plan Note (Signed)
Controlled, no change in medication  

## 2013-04-21 NOTE — Assessment & Plan Note (Signed)
Deteriorated. Patient re-educated about  the importance of commitment to a  minimum of 150 minutes of exercise per week. The importance of healthy food choices with portion control discussed. Encouraged to start a food diary, count calories and to consider  joining a support group. Sample diet sheets offered. Goals set by the patient for the next several months.    

## 2013-04-24 ENCOUNTER — Encounter (HOSPITAL_COMMUNITY): Payer: Self-pay | Admitting: Pharmacy Technician

## 2013-05-02 ENCOUNTER — Telehealth: Payer: Self-pay | Admitting: Gastroenterology

## 2013-05-02 ENCOUNTER — Telehealth: Payer: Self-pay | Admitting: Family Medicine

## 2013-05-02 NOTE — Telephone Encounter (Signed)
I LMOM for patient to return my call.

## 2013-05-02 NOTE — Telephone Encounter (Signed)
Pt called to [facility] her procedure on 6/23 with SF. Please call patient back at [phone]

## 2013-05-03 NOTE — Telephone Encounter (Signed)
Patient aware that mammogram done on 03/09/2013

## 2013-05-15 ENCOUNTER — Encounter (HOSPITAL_COMMUNITY): Payer: Self-pay | Admitting: *Deleted

## 2013-05-15 ENCOUNTER — Encounter (HOSPITAL_COMMUNITY)
Admission: RE | Disposition: A | Discharge status: Home / Self Care | Payer: Self-pay | Source: Ambulatory Visit | Attending: Gastroenterology

## 2013-05-15 ENCOUNTER — Ambulatory Visit (HOSPITAL_COMMUNITY)
Admission: RE | Admit: 2013-05-15 | Discharge: 2013-05-15 | Disposition: A | Discharge status: Home / Self Care | Payer: 59 | Source: Ambulatory Visit | Attending: Gastroenterology | Admitting: Gastroenterology

## 2013-05-15 DIAGNOSIS — Z87891 Personal history of nicotine dependence: Secondary | ICD-10-CM | POA: Insufficient documentation

## 2013-05-15 DIAGNOSIS — K3189 Other diseases of stomach and duodenum: Secondary | ICD-10-CM | POA: Insufficient documentation

## 2013-05-15 DIAGNOSIS — D649 Anemia, unspecified: Secondary | ICD-10-CM | POA: Insufficient documentation

## 2013-05-15 DIAGNOSIS — Z79899 Other long term (current) drug therapy: Secondary | ICD-10-CM | POA: Insufficient documentation

## 2013-05-15 DIAGNOSIS — K625 Hemorrhage of anus and rectum: Secondary | ICD-10-CM

## 2013-05-15 DIAGNOSIS — Z885 Allergy status to narcotic agent status: Secondary | ICD-10-CM | POA: Insufficient documentation

## 2013-05-15 DIAGNOSIS — K2960 Other gastritis without bleeding: Secondary | ICD-10-CM | POA: Insufficient documentation

## 2013-05-15 DIAGNOSIS — A048 Other specified bacterial intestinal infections: Secondary | ICD-10-CM | POA: Insufficient documentation

## 2013-05-15 DIAGNOSIS — D126 Benign neoplasm of colon, unspecified: Secondary | ICD-10-CM

## 2013-05-15 DIAGNOSIS — K648 Other hemorrhoids: Secondary | ICD-10-CM

## 2013-05-15 DIAGNOSIS — K621 Rectal polyp: Secondary | ICD-10-CM

## 2013-05-15 DIAGNOSIS — K2970 Gastritis, unspecified, without bleeding: Secondary | ICD-10-CM

## 2013-05-15 DIAGNOSIS — K2990 Gastroduodenitis, unspecified, without bleeding: Secondary | ICD-10-CM

## 2013-05-15 DIAGNOSIS — K620 Anal polyp: Secondary | ICD-10-CM

## 2013-05-15 DIAGNOSIS — R1310 Dysphagia, unspecified: Secondary | ICD-10-CM

## 2013-05-15 DIAGNOSIS — F319 Bipolar disorder, unspecified: Secondary | ICD-10-CM | POA: Insufficient documentation

## 2013-05-15 DIAGNOSIS — K449 Diaphragmatic hernia without obstruction or gangrene: Secondary | ICD-10-CM | POA: Insufficient documentation

## 2013-05-15 HISTORY — PX: ESOPHAGOGASTRODUODENOSCOPY (EGD) WITH ESOPHAGEAL DILATION: SHX5812

## 2013-05-15 HISTORY — PX: COLONOSCOPY: SHX5424

## 2013-05-15 HISTORY — PX: HEMORRHOID BANDING: SHX5850

## 2013-05-15 SURGERY — COLONOSCOPY
Anesthesia: Moderate Sedation

## 2013-05-15 MED ORDER — PROMETHAZINE HCL 25 MG/ML IJ SOLN
INTRAMUSCULAR | Status: AC
  Filled 2013-05-15: qty 1

## 2013-05-15 MED ORDER — SODIUM CHLORIDE 0.9 % IJ SOLN
INTRAMUSCULAR | Status: AC
  Filled 2013-05-15: qty 10

## 2013-05-15 MED ORDER — BUTAMBEN-TETRACAINE-BENZOCAINE 2-2-14 % EX AERO
INHALATION_SPRAY | CUTANEOUS | Status: DC | PRN
  Administered 2013-05-15: 2 via TOPICAL

## 2013-05-15 MED ORDER — MIDAZOLAM HCL 5 MG/5ML IJ SOLN
INTRAMUSCULAR | Status: DC | PRN
  Administered 2013-05-15 (×2): 2 mg via INTRAVENOUS
  Administered 2013-05-15: 1 mg via INTRAVENOUS

## 2013-05-15 MED ORDER — MIDAZOLAM HCL 5 MG/5ML IJ SOLN
INTRAMUSCULAR | Status: AC
  Filled 2013-05-15: qty 10

## 2013-05-15 MED ORDER — MEPERIDINE HCL 100 MG/ML IJ SOLN
INTRAMUSCULAR | Status: AC
  Filled 2013-05-15: qty 2

## 2013-05-15 MED ORDER — SODIUM CHLORIDE 0.9 % IV SOLN
INTRAVENOUS | Status: DC
  Administered 2013-05-15: 14:00:00 via INTRAVENOUS

## 2013-05-15 MED ORDER — MINERAL OIL PO OIL
TOPICAL_OIL | ORAL | Status: AC
  Filled 2013-05-15: qty 30

## 2013-05-15 MED ORDER — STERILE WATER FOR IRRIGATION IR SOLN
Status: DC | PRN
  Administered 2013-05-15: 15:00:00

## 2013-05-15 MED ORDER — PROMETHAZINE HCL 25 MG/ML IJ SOLN
INTRAMUSCULAR | Status: DC | PRN
  Administered 2013-05-15: 12.5 mg via INTRAVENOUS

## 2013-05-15 MED ORDER — MEPERIDINE HCL 100 MG/ML IJ SOLN
INTRAMUSCULAR | Status: DC | PRN
  Administered 2013-05-15: 50 mg via INTRAVENOUS
  Administered 2013-05-15 (×2): 25 mg via INTRAVENOUS

## 2013-05-15 NOTE — Op Note (Addendum)
[facility] [address] [place], [address]   COLONOSCOPY PROCEDURE REPORT  PATIENT: Lydia, Perez  MR#: #[id number] BIRTHDATE: 1956/01/20 , 56  yrs. old GENDER: Female ENDOSCOPIST: Jonette Eva, MD REFERRED XB:[id number] Lodema Hong, M.D. PROCEDURE DATE:  05/15/2013 PROCEDURE:   Colonoscopy with cold biopsy polypectomy and Hemorrhoidectomy via banding INDICATIONS:Rectal Bleeding. MEDICATIONS: Demerol 75 mg IV, Versed 5 mg IV, and Promethazine (Phenergan) 12.5mg  IV  DESCRIPTION OF PROCEDURE:    Physical exam was performed.  Informed consent was obtained from the patient after explaining the benefits, risks, and alternatives to procedure.  The patient was connected to monitor and placed in left lateral position. Continuous oxygen was provided by nasal cannula and IV medicine administered through an indwelling cannula.  After administration of sedation and rectal exam, the patients rectum was intubated and the EC-[id number] ([id number]) and EG-[id number] ([id number])  colonoscope was advanced under direct visualization to the ileum.  The scope was removed slowly by carefully examining the color, texture, anatomy, and integrity mucosa on the way out.  The patient was recovered in endoscopy and discharged home in satisfactory condition.       COLON FINDINGS: The mucosa appeared normal in the terminal ileum.  , Six sessile polyps measuring 3-4 mm in size were found in the descending colon(1), sigmoid colon (2), and rectum (3).  A polypectomy was performed with cold forceps.  , The colon was otherwise normal.  There was no diverticulosis, inflammation, polyps or cancers unless previously stated.  , and Moderate sized internal hemorrhoids were found.    3 BANDS APPLIED.  PREP QUALITY: good.  CECAL W/D TIME: 15 minutes  COMPLICATIONS: C/O RECTAL PAIN AFTER TCS/EGD. BANDS ADJUSTED. PT PAIN FREE TO POSTOP. PT AGITATED DURING TCS WITH NO RECALL IN POST-OP.  ENDOSCOPIC  IMPRESSION: 1.  SIX COLORECTALPOLYPS REMOVED. 2. RECTAL BLEEDING DUE TO Moderate sized internal hemorrhoids  RECOMMENDATIONS: CONTINUE NEXIUM once daily 30 MINUTES PRIOR TO BREAKFAST. FOLLOW A LOW RESIDUE DIET.  AVOID ITEMS THAT CAUSE BLOATING. BIOPSY WILL BE BACK IN 7 DAYS. WORK EXCUSE GIVEN-RETURN TO WORK JUL 1 AFTER 4 PM. Follow up in 2-3 WEEKS. Next colonoscopy in 10 years.       _______________________________ Rosalie DoctorJonette Eva, MD 05/16/2013 9:41 AM Revised: 05/16/2013 9:41 AM    PATIENT NAME:  Lydia, Perez MR#: #[id number]

## 2013-05-15 NOTE — H&P (Signed)
  Primary Care Physician:  Syliva Overman, MD Primary Gastroenterologist:  Dr. Darrick Penna  Pre-Procedure History & Physical: HPI:  Lydia Perez is a 57 y.o. female here for  DYSPEPSIA/BRBPR.   Past Medical History  Diagnosis Date  . Anemia     NOS   . Hiatal hernia   . Bipolar disorder 2010    dx by PCP   . History of cardiac cath 2002  . Esophageal stricture     Past Surgical History  Procedure Laterality Date  . Total abdominal hysterectomy w/ bilateral salpingoophorectomy  01/2005  . Tubal ligation    . Esophagogastroduodenoscopy  07/19/2007    SLF: Normal  . Colonoscopy  08/13/2008    SLF: SIMPLE ADENOMA/HYPERPLASTIC POLYP    Prior to Admission medications   Medication Sig Start Date End Date Taking? Authorizing Provider  ibuprofen (ADVIL,MOTRIN) 800 MG tablet Take 1 tablet (800 mg total) by mouth every 8 (eight) hours as needed for pain. 04/21/13  Yes Kerri Perches, MD  QUEtiapine Fumarate (SEROQUEL XR) 150 MG 24 hr tablet Take 150 mg by mouth at bedtime. 06/30/12  Yes Kerri Perches, MD  amLODipine (NORVASC) 5 MG tablet Take 1 tablet (5 mg total) by mouth daily. 12/23/12 01/31/14  Kerri Perches, MD  esomeprazole (NEXIUM) 20 MG capsule Take 1 capsule (20 mg total) by mouth daily. 12/23/12 12/23/13  Kerri Perches, MD    Allergies as of 04/06/2013 - Review Complete 04/06/2013  Allergen Reaction Noted  . Codeine  12/21/2006    Family History  Problem Relation Age of Onset  . Emphysema Father     was a smoker  . Kidney disease Mother   . Heart disease Mother     1st by pass in her 42's   . Heart failure Mother   . Diabetes Mother   . Hypertension Mother   . Heart attack Brother   . Heart failure Brother   . Colon cancer Neg Hx   . Colon polyps Neg Hx   . Lung cancer Father     was a smoker    History   Social History  . Marital Status: Married    Spouse Name: N/A    Number of Children: 3  . Years of Education: N/A   Occupational History   . Insurance claims handler    Social History Main Topics  . Smoking status: Former Smoker -- 1.00 packs/day for 25 years    Types: Cigarettes    Quit date: 01/03/2013  . Smokeless tobacco: Never Used  . Alcohol Use: No  . Drug Use: No     Comment: marijuana   . Sexually Active: Yes    Birth Control/ Protection: Surgical   Other Topics Concern  . Not on file   Social History Narrative  . No narrative on file    Review of Systems: See HPI, otherwise negative ROS   Physical Exam: BP 140/86  Pulse 64  Temp(Src) 97.9 F (36.6 C) (Oral)  Resp 11  SpO2 98% General:   Alert,  pleasant and cooperative in NAD Head:  Normocephalic and atraumatic. Neck:  Supple; Lungs:  Clear throughout to auscultation.    Heart:  Regular rate and rhythm. Abdomen:  Soft, nontender and nondistended. Normal bowel sounds, without guarding, and without rebound.   Neurologic:  Alert and  oriented x4;  grossly normal neurologically.  Impression/Plan:     BRBPR/dyspepsia  PLAN: EGD/TCS TODAY

## 2013-05-15 NOTE — Op Note (Signed)
[facility] [address] [place], [address]   ENDOSCOPY PROCEDURE REPORT  PATIENT: Lydia Perez, Lydia Perez  MR#: #[id number] BIRTHDATE: 02/19/1956 , 56  yrs. old GENDER: Female  ENDOSCOPIST: Jonette Eva, MD REFFERED XB:JYNWGNFA Lodema Hong, M.D.  PROCEDURE DATE:  05/15/2013 PROCEDURE:   EGD with biopsy and EGD with dilatation over guidewire   INDICATIONS:1.  dyspepsia.   2.  dysphagia. MEDICATIONS: TCS + Demerol 25 mg IV and Versed 2 mg IV TOPICAL ANESTHETIC: Cetacaine Spray  DESCRIPTION OF PROCEDURE:   After the risks benefits and alternatives of the procedure were thoroughly explained, informed consent was obtained.  The EG-[id number] ([id number])  endoscope was introduced through the mouth and advanced to the second portion of the duodenum. The instrument was slowly withdrawn as the mucosa was carefully examined.  Prior to withdrawal of the scope, the guidwire was placed.  The esophagus was dilated successfully.  The patient was recovered in endoscopy and discharged home in satisfactory condition.   ESOPHAGUS: The mucosa of the esophagus appeared normal.   UNABLE TO APPRECIATE DEFINITE STRICTURE. EMPIRIC DILATION PERFORMED DUE TO C/O DYSPHAGIA. STOMACH: Mild non-erosive gastritis (inflammation) was found in the gastric antrum.  Multiple biopsies were performed using cold forceps.  DUODENUM: The duodenal mucosa showed no abnormalities in the bulb and second portion of the duodenum. Dilation was then performed at the distal esophagus Dilator: Savary over guidewire Size(s): 15-16 MM Resistance: minimal  COMPLICATIONS: There were no complications.  ENDOSCOPIC IMPRESSION: 1.   MILD Non-erosive gastritis  RECOMMENDATIONS: NEXIUM DAILY LOW RESIDUE DIET AWAIT BIOPSY OPV IN 4 MOS TO REASSESS DYSPEPSIA/DYSPHAGIA      _______________________________ eSignedJonette Eva, MD 05/15/2013 4:20 PM

## 2013-05-16 ENCOUNTER — Encounter (HOSPITAL_COMMUNITY): Payer: Self-pay | Admitting: Gastroenterology

## 2013-05-24 ENCOUNTER — Telehealth: Payer: Self-pay | Admitting: Gastroenterology

## 2013-05-24 NOTE — Telephone Encounter (Signed)
PRE-CERT FOR [facility] BANDING-DX CODE 455.2

## 2013-05-24 NOTE — Telephone Encounter (Addendum)
Please call pt. She had a simple adenoma removed from her colon. She has H. Pylori gastritis. She TAKES SEROQUEL SO SHE NEEDS PYLERA 3 PILLS QID FOR 10 DAYS. She should take NEXIUM BID for 10 days then once daily. The meds can cause nausea, vomiting, abd cramps, loose stools, black colored stools, and metallic taste in her mouth. PYLERA MAY BE A TOP TIER MEDICATION FOR HER INSURANCE.  FOLLOW A LOW RESIDUE DIET UNTIL OPV JUL 16. AVOID ITEMS THAT CAUSE BLOATING.  Next colonoscopy in 10 years.

## 2013-05-25 ENCOUNTER — Ambulatory Visit: Payer: 59 | Admitting: Gastroenterology

## 2013-05-25 MED ORDER — BIS SUBCIT-METRONID-TETRACYC 140-125-125 MG PO CAPS: ORAL_CAPSULE | ORAL | Status: DC

## 2013-05-25 NOTE — Telephone Encounter (Signed)
Cc PCP 

## 2013-05-25 NOTE — Telephone Encounter (Signed)
Tried to call pt. Home number disconnected, mobile says she is unavailable, work number not allowed messages. Called emergency contact, spouse, Marcial Pacas at [phone] and [facility] for a return call.

## 2013-05-26 ENCOUNTER — Telehealth: Payer: Self-pay | Admitting: Gastroenterology

## 2013-05-26 NOTE — Telephone Encounter (Signed)
Pt LMOM after hours yesterday and said she was calling back and we could reach her at [phone].

## 2013-05-26 NOTE — Telephone Encounter (Signed)
Pt called and was informed of results. Her new phone number has been entered for future reference.

## 2013-05-26 NOTE — Telephone Encounter (Signed)
Called the number given. Many rings and no answer.

## 2013-05-26 NOTE — Telephone Encounter (Signed)
Letter mailed for pt to call for results and plan of care.

## 2013-05-26 NOTE — Telephone Encounter (Signed)
Called [phone] and many rings again. Could not leave a message.

## 2013-05-29 ENCOUNTER — Encounter: Payer: Self-pay | Admitting: Gastroenterology

## 2013-05-29 ENCOUNTER — Telehealth: Payer: Self-pay

## 2013-05-29 NOTE — Telephone Encounter (Signed)
Reminder in epic °

## 2013-05-29 NOTE — Telephone Encounter (Signed)
Pt called and said she could not afford the Pylera, her part would be $113.00. We had a box here so I gave them to her (leaving at front for her to pick up) along with instructions and to take the Nexium bid while taking this.

## 2013-05-29 NOTE — Telephone Encounter (Signed)
PT is aware of her instructions.

## 2013-05-29 NOTE — Telephone Encounter (Signed)
SAMPLES=10 DAY COURSE GIVEN TO PT.

## 2013-05-30 ENCOUNTER — Ambulatory Visit: Payer: 59 | Admitting: Gastroenterology

## 2013-05-31 ENCOUNTER — Ambulatory Visit (INDEPENDENT_AMBULATORY_CARE_PROVIDER_SITE_OTHER): Payer: 59 | Admitting: Gastroenterology

## 2013-05-31 ENCOUNTER — Encounter: Payer: Self-pay | Admitting: Gastroenterology

## 2013-05-31 VITALS — BP 131/82 | HR 76 | Temp 97.2°F | Ht 64.0 in | Wt 189.0 lb

## 2013-05-31 DIAGNOSIS — A048 Other specified bacterial intestinal infections: Secondary | ICD-10-CM

## 2013-05-31 DIAGNOSIS — R1319 Other dysphagia: Secondary | ICD-10-CM | POA: Insufficient documentation

## 2013-05-31 DIAGNOSIS — B9681 Helicobacter pylori [H. pylori] as the cause of diseases classified elsewhere: Secondary | ICD-10-CM

## 2013-05-31 DIAGNOSIS — K648 Other hemorrhoids: Secondary | ICD-10-CM | POA: Insufficient documentation

## 2013-05-31 DIAGNOSIS — K2970 Gastritis, unspecified, without bleeding: Secondary | ICD-10-CM | POA: Insufficient documentation

## 2013-05-31 NOTE — Patient Instructions (Signed)
AVOID CONSTIPATION AND STRAINING.  DRINK WATER TO KEEP YOUR URINE LIGHT YELLOW.  EAT FIBER. AVOID ITEMS THAT CAUSE BLOATING & GAS.  FOLLOW UP IN 6 MOS.   CALL ME IF YOU HAVE QUESTIONS OR CONCERNS.

## 2013-05-31 NOTE — Progress Notes (Signed)
  Subjective:    Patient ID: Lydia Perez, female    DOB: [birth year], 57 y.o.   MRN: [id number]  Syliva Overman, MD  HPI No problems after TCS/banding except didn't have a bm for 6 days. Took some prune juice. QUESTIONS  ABOUT H PYLORI TREATMENT. MAY NEED TO RUN TO THE BATHROOM. WALKING. TOLERATING MEDS. NO RECTAL BLEEDING, PRESSURE, PAIN, ITCHING, BURNING, SOILING  Past Medical History  Diagnosis Date  . Anemia     NOS   . Hiatal hernia   . Bipolar disorder 2010    dx by PCP   . History of cardiac cath 2002  . Esophageal stricture    Past Surgical History  Procedure Laterality Date  . Total abdominal hysterectomy w/ bilateral salpingoophorectomy  01/2005  . Tubal ligation    . Esophagogastroduodenoscopy  07/19/2007    SLF: Normal  . Colonoscopy  08/13/2008    SLF: SIMPLE ADENOMA/HYPERPLASTIC POLYP  . Colonoscopy N/A 05/15/2013    SLF:SIX COLORECTALPOLYPS REMOVED./RECTAL BLEEDING DUE TO Moderate sized internal hemorrhoids  . Hemorrhoid banding N/A 05/15/2013    Procedure: HEMORRHOID BANDING;  Surgeon: West Bali, MD;  Location: [facility];  Service: Endoscopy;  Laterality: N/A;  . Esophagogastroduodenoscopy (egd) with esophageal dilation N/A 05/15/2013    WUJ:WJXB Non-erosive gastritis   Allergies  Allergen Reactions  . Codeine Other (See Comments)    Hallucinations   Current Outpatient Prescriptions  Medication Sig Dispense Refill  . amLODipine (NORVASC) 5 MG tablet Take 1 tablet (5 mg total) by mouth daily.    Marland Kitchen bismuth-metronidazole-tetracycline (PYLERA) 140-125-125 MG per capsule 3 PO QID FOR 10 DAYS    . esomeprazole (NEXIUM) 20 MG capsule Take 1 capsule (20 mg total) by mouth daily.    Marland Kitchen ibuprofen (ADVIL,MOTRIN) 800 MG tablet Take 1 tablet (800 mg total) by mouth every 8 (eight) hours as needed for pain.    Marland Kitchen QUEtiapine Fumarate (SEROQUEL XR) 150 MG 24 hr tablet Take 150 mg by mouth at bedtime.          Review of Systems     Objective:   Physical  Exam  Vitals reviewed. Constitutional: She is oriented to person, place, and time. She appears well-nourished. No distress.  HENT:  Head: Normocephalic and atraumatic.  Mouth/Throat: Oropharynx is clear and moist. No oropharyngeal exudate.  Eyes: Pupils are equal, round, and reactive to light. No scleral icterus.  Neck: Normal range of motion. Neck supple.  Cardiovascular: Normal rate, regular rhythm and normal heart sounds.   Pulmonary/Chest: Effort normal and breath sounds normal. No respiratory distress.  Abdominal: Soft. Bowel sounds are normal. She exhibits no distension. There is no tenderness.  Musculoskeletal: She exhibits no edema.  Lymphadenopathy:    She has no cervical adenopathy.  Neurological: She is alert and oriented to person, place, and time.  NO FOCAL DEFICITS   Psychiatric: She has a normal mood and affect.          Assessment & Plan:

## 2013-05-31 NOTE — Assessment & Plan Note (Addendum)
TOLERATING PYLERA.  COMPLETE RX ADD PROBIOTIC FOR 3 MOS.

## 2013-05-31 NOTE — Progress Notes (Signed)
Cc PCP 

## 2013-05-31 NOTE — Assessment & Plan Note (Signed)
SX RESOLVED AFTER HEMORRHOID BANDING x3  AVOID CONSTIPATION DRINK WATER EAT FIBER OPV IN 6 MOS.

## 2013-05-31 NOTE — Progress Notes (Signed)
REMINDER APPT MADE FOR 6 MONTH F/U APPT.

## 2013-05-31 NOTE — Assessment & Plan Note (Signed)
SX IMRPOVED.  MONITOR FOR RECURRENT SYMPTOMS. OPV IN 6 MOS

## 2013-08-25 ENCOUNTER — Ambulatory Visit: Payer: 59 | Admitting: Family Medicine

## 2013-09-06 ENCOUNTER — Encounter: Payer: Self-pay | Admitting: Family Medicine

## 2013-09-06 ENCOUNTER — Ambulatory Visit (INDEPENDENT_AMBULATORY_CARE_PROVIDER_SITE_OTHER): Payer: 59 | Admitting: Family Medicine

## 2013-09-06 VITALS — BP 118/84 | HR 75 | Resp 16 | Ht 64.0 in | Wt 193.0 lb

## 2013-09-06 DIAGNOSIS — I10 Essential (primary) hypertension: Secondary | ICD-10-CM

## 2013-09-06 DIAGNOSIS — R7303 Prediabetes: Secondary | ICD-10-CM | POA: Insufficient documentation

## 2013-09-06 DIAGNOSIS — F329 Major depressive disorder, single episode, unspecified: Secondary | ICD-10-CM

## 2013-09-06 DIAGNOSIS — F3289 Other specified depressive episodes: Secondary | ICD-10-CM

## 2013-09-06 DIAGNOSIS — E663 Overweight: Secondary | ICD-10-CM

## 2013-09-06 DIAGNOSIS — R7309 Other abnormal glucose: Secondary | ICD-10-CM

## 2013-09-06 DIAGNOSIS — E785 Hyperlipidemia, unspecified: Secondary | ICD-10-CM

## 2013-09-06 DIAGNOSIS — K219 Gastro-esophageal reflux disease without esophagitis: Secondary | ICD-10-CM

## 2013-09-06 DIAGNOSIS — E559 Vitamin D deficiency, unspecified: Secondary | ICD-10-CM

## 2013-09-06 MED ORDER — AMLODIPINE BESYLATE 5 MG PO TABS: 5.0000 mg | ORAL_TABLET | Freq: Every day | ORAL | Status: DC

## 2013-09-06 NOTE — Patient Instructions (Addendum)
F/u 5 month, call iof you need me before  fasting lipid, chem 7 ,  ViT D and HBA1C this week, I will send you the results , and comments   Labs for 5 months are non fasting HBA1C, chem 7 , CBC and TSH  It is important that you exercise regularly at least 30 minutes 5 times a week. If you develop chest pain, have severe difficulty breathing, or feel very tired, stop exercising immediately and seek medical attention   Weight loss goal of 2 to3 pounds per month

## 2013-09-06 NOTE — Progress Notes (Signed)
  Subjective:    Patient ID: Lydia Perez, female    DOB: 03/02/1956, 57 y.o.   MRN: [id number]  HPI The PT is here for follow up and re-evaluation of chronic medical conditions, medication management and review of any available recent lab and radiology data.  Preventive health is updated, specifically  Cancer screening and Immunization.   Questions or concerns regarding consultations or procedures which the PT has had in the interim are  addressed. The PT denies any adverse reactions to current medications since the last visit.  There are no new concerns.  There are no specific complaints       Review of Systems See HPI Denies recent fever or chills. Denies sinus pressure, nasal congestion, ear pain or sore throat. Denies chest congestion, productive cough or wheezing. Denies chest pains, palpitations and leg swelling Denies abdominal pain, nausea, vomiting,diarrhea or constipation.   Denies dysuria, frequency, hesitancy or incontinence. Denies joint pain, swelling and limitation in mobility. Denies headaches, seizures, numbness, or tingling. Denies uncontrolled  depression, anxiety or insomnia. Denies skin break down or rash.        Objective:   Physical Exam Patient alert and oriented and in no cardiopulmonary distress.  HEENT: No facial asymmetry, EOMI, no sinus tenderness,  oropharynx pink and moist.  Neck supple no adenopathy.  Chest: Clear to auscultation bilaterally.  CVS: S1, S2 no murmurs, no S3.  ABD: Soft non tender. Bowel sounds normal.  Ext: No edema  MS: Adequate ROM spine, shoulders, hips and knees.  Skin: Intact, no ulcerations or rash noted.  Psych: Good eye contact, normal affect. Memory intact not anxious or depressed appearing.  CNS: CN 2-12 intact, power, tone and sensation normal throughout.        Assessment & Plan:

## 2013-09-09 LAB — BASIC METABOLIC PANEL
Glucose, Bld: 94 mg/dL (ref 70–99)
Potassium: 4.4 mEq/L (ref 3.5–5.3)
Sodium: 142 mEq/L (ref 135–145)

## 2013-09-09 LAB — HEMOGLOBIN A1C: Hgb A1c MFr Bld: 5.9 % — ABNORMAL HIGH (ref <5.7)

## 2013-09-09 LAB — LIPID PANEL: Total CHOL/HDL Ratio: 3.3 Ratio

## 2013-09-11 ENCOUNTER — Other Ambulatory Visit: Payer: Self-pay | Admitting: Family Medicine

## 2013-09-11 LAB — VITAMIN D 25 HYDROXY (VIT D DEFICIENCY, FRACTURES): Vit D, 25-Hydroxy: 18 ng/mL — ABNORMAL LOW (ref 30–89)

## 2013-09-15 ENCOUNTER — Other Ambulatory Visit: Payer: Self-pay

## 2013-09-15 MED ORDER — ERGOCALCIFEROL 1.25 MG (50000 UT) PO CAPS: 50000.0000 [IU] | ORAL_CAPSULE | ORAL | Status: DC

## 2013-09-29 ENCOUNTER — Other Ambulatory Visit: Payer: Self-pay

## 2013-09-29 DIAGNOSIS — I10 Essential (primary) hypertension: Secondary | ICD-10-CM

## 2013-09-29 MED ORDER — ERGOCALCIFEROL 1.25 MG (50000 UT) PO CAPS: 50000.0000 [IU] | ORAL_CAPSULE | ORAL | Status: DC

## 2013-09-29 MED ORDER — AMLODIPINE BESYLATE 5 MG PO TABS
5.0000 mg | ORAL_TABLET | Freq: Every day | ORAL | Status: DC
Start: 2013-09-29 — End: 2015-01-17

## 2013-10-15 NOTE — Assessment & Plan Note (Signed)
unchanged Patient educated about the importance of limiting  Carbohydrate intake , the need to commit to daily physical activity for a minimum of 30 minutes , and to commit weight loss. The fact that changes in all these areas will reduce or eliminate all together the development of diabetes is stressed.    

## 2013-10-15 NOTE — Assessment & Plan Note (Signed)
Needs weekly supplement 

## 2013-10-15 NOTE — Assessment & Plan Note (Signed)
Improved, biut LDL still slightly elevated Hyperlipidemia:Low fat diet discussed and encouraged.

## 2013-10-15 NOTE — Assessment & Plan Note (Signed)
Improved and controlled on current medication 

## 2013-10-15 NOTE — Assessment & Plan Note (Signed)
Controlled, no change in medication DASH diet and commitment to daily physical activity for a minimum of 30 minutes discussed and encouraged, as a part of hypertension management. The importance of attaining a healthy weight is also discussed.  

## 2013-10-15 NOTE — Assessment & Plan Note (Signed)
Deteriorated. Patient re-educated about  the importance of commitment to a  minimum of 150 minutes of exercise per week. The importance of healthy food choices with portion control discussed. Encouraged to start a food diary, count calories and to consider  joining a support group. Sample diet sheets offered. Goals set by the patient for the next several months.    

## 2013-10-15 NOTE — Assessment & Plan Note (Signed)
Controlled, no change in medication  

## 2014-01-17 ENCOUNTER — Ambulatory Visit: Payer: 59 | Admitting: Family Medicine

## 2014-02-07 ENCOUNTER — Ambulatory Visit: Payer: 59 | Admitting: Family Medicine

## 2014-02-15 ENCOUNTER — Ambulatory Visit: Payer: 59 | Admitting: Family Medicine

## 2014-06-12 ENCOUNTER — Encounter: Payer: Self-pay | Admitting: Family Medicine

## 2014-06-12 ENCOUNTER — Ambulatory Visit (INDEPENDENT_AMBULATORY_CARE_PROVIDER_SITE_OTHER): Payer: BC Managed Care – PPO | Admitting: Family Medicine

## 2014-06-12 VITALS — BP 140/90 | HR 88 | Resp 16 | Ht 64.0 in | Wt 198.4 lb

## 2014-06-12 DIAGNOSIS — F411 Generalized anxiety disorder: Secondary | ICD-10-CM

## 2014-06-12 DIAGNOSIS — F19988 Other psychoactive substance use, unspecified with other psychoactive substance-induced disorder: Secondary | ICD-10-CM

## 2014-06-12 DIAGNOSIS — F32A Depression, unspecified: Secondary | ICD-10-CM

## 2014-06-12 DIAGNOSIS — F17218 Nicotine dependence, cigarettes, with other nicotine-induced disorders: Secondary | ICD-10-CM

## 2014-06-12 DIAGNOSIS — R7309 Other abnormal glucose: Secondary | ICD-10-CM

## 2014-06-12 DIAGNOSIS — I10 Essential (primary) hypertension: Secondary | ICD-10-CM

## 2014-06-12 DIAGNOSIS — R7303 Prediabetes: Secondary | ICD-10-CM

## 2014-06-12 DIAGNOSIS — F3289 Other specified depressive episodes: Secondary | ICD-10-CM

## 2014-06-12 DIAGNOSIS — F329 Major depressive disorder, single episode, unspecified: Secondary | ICD-10-CM

## 2014-06-12 DIAGNOSIS — F419 Anxiety disorder, unspecified: Secondary | ICD-10-CM

## 2014-06-12 DIAGNOSIS — E663 Overweight: Secondary | ICD-10-CM

## 2014-06-12 DIAGNOSIS — F17200 Nicotine dependence, unspecified, uncomplicated: Secondary | ICD-10-CM | POA: Insufficient documentation

## 2014-06-12 MED ORDER — BUPROPION HCL ER (XL) 150 MG PO TB24: 150.0000 mg | ORAL_TABLET | Freq: Every day | ORAL | Status: DC

## 2014-06-12 MED ORDER — BENZONATATE 100 MG PO CAPS: 100.0000 mg | ORAL_CAPSULE | Freq: Four times a day (QID) | ORAL | Status: DC | PRN

## 2014-06-12 MED ORDER — LORAZEPAM 0.5 MG PO TABS: 0.5000 mg | ORAL_TABLET | Freq: Every evening | ORAL | Status: DC | PRN

## 2014-06-12 NOTE — Progress Notes (Signed)
Subjective:    Patient ID: Lydia Perez, female    DOB: 08-26-1956, 58 y.o.   MRN: [id number]  HPI The PT is here for follow up and re-evaluation of chronic medical conditions, medication management and review of any available recent lab and radiology data.  Preventive health is updated, specifically  Cancer screening and Immunization.    The PT denies any adverse reactions to current medications since the last visit.  Recent had loss of her step son unexpectedly and this has caused severe stress, anxiety and depression in the home. She is trying her best to "keep things going" but does note increased difficulty with sleep, increased anxiety, depression and iritability, wants help with medication , not currently interested in therapy, states family is helping each other to "get through it" She denies suicidal or homicidal ideation and has had no hallucinations Smoking has increased and though she wants to quit , is unable to make a commitment currently No regular exercise with poor eating habits during the past 2 monhths    Review of Systems See HPI Denies recent fever or chills. Denies sinus pressure, nasal congestion, ear pain or sore throat. Denies chest congestion, productive cough or wheezing. Denies chest pains, palpitations and leg swelling Denies abdominal pain, nausea, vomiting,diarrhea or constipation.   Denies dysuria, frequency, hesitancy or incontinence. Denies joint pain, swelling and limitation in mobility. C/o left foot pain x 1 month, has had injection for this in the past , will call if she needs to see podiatry about this again Denies headaches, seizures, numbness, or tingling.  Denies skin break down or rash.        Objective:   Physical Exam BP 140/90  Pulse 88  Resp 16  Ht 5\' 4"  (1.626 m)  Wt 198 lb 6.4 oz (89.994 kg)  BMI 34.04 kg/m2  SpO2 97% Patient alert and oriented and in no cardiopulmonary distress.  HEENT: No facial asymmetry, EOMI,    oropharynx pink and moist.  Neck supple no JVD, no mass.  Chest: Clear to auscultation bilaterally.  CVS: S1, S2 no murmurs, no S3.Regular rate.  ABD: Soft non tender.   Ext: No edema  MS: Adequate ROM spine, shoulders, hips and knees.Left foot tender under sole  Skin: Intact, no ulcerations or rash noted.  Psych: Good eye contact, flat  affect. Memory intact anxious and  depressed appearing.  CNS: CN 2-12 intact, power,  normal throughout.no focal deficits noted.        Assessment & Plan:  HYPERTENSION Uncontrolled at this visit No change in meds at this time DASH diet and commitment to daily physical activity for a minimum of 30 minutes discussed and encouraged, as a part of hypertension management. The importance of attaining a healthy weight is also discussed. F/u in2 month   Prediabetes Patient educated about the importance of limiting  Carbohydrate intake , the need to commit to daily physical activity for a minimum of 30 minutes , and to commit weight loss. The fact that changes in all these areas will reduce or eliminate all together the development of diabetes is stressed.   Updated lab needed at/ before next visit.   Depression deteriorated due to recent passing of family member. No interest in therapy currently, wants to try to work through present situation with family support. wellbutrin started also with the hope of helping her to stop smoking   Anxiety And uncontrolled with poor sleep due to recent death  Of her spouse's son, ativan  short term to help with sleep  Nicotine dependence deteriorated Patient counseled for approximately 5 minutes regarding the health risks of ongoing nicotine use, specifically all types of cancer, heart disease, stroke and respiratory failure. The options available for help with cessation ,the behavioral changes to assist the process, and the option to either gradully reduce usage  Or abruptly stop.is also discussed. Pt is  also encouraged to set specific goals in number of cigarettes used daily, as well as to set a quit date.   OVERWEIGHT Unchanged Patient re-educated about  the importance of commitment to a  minimum of 150 minutes of exercise per week. The importance of healthy food choices with portion control discussed. Encouraged to start a food diary, count calories and to consider  joining a support group. Sample diet sheets offered. Goals set by the patient for the next several months.

## 2014-06-12 NOTE — Patient Instructions (Addendum)
F/u first  week in September, call if you need me before  New for smoking cessation, and to help you with anxiety an d depression is wellbutrin one daily   New for help with sleep is ativan at beditme , short term only  We will refill tessalon perles for cough , also BP is high today, no change in medication now  Sorry about your recent loss, call if you want therapy  Call for referral for left foot pain when ready

## 2014-07-31 NOTE — Assessment & Plan Note (Signed)
Patient educated about the importance of limiting  Carbohydrate intake , the need to commit to daily physical activity for a minimum of 30 minutes , and to commit weight loss. The fact that changes in all these areas will reduce or eliminate all together the development of diabetes is stressed.   Updated lab needed at/ before next visit.  

## 2014-07-31 NOTE — Assessment & Plan Note (Signed)
deteriorated Patient counseled for approximately 5 minutes regarding the health risks of ongoing nicotine use, specifically all types of cancer, heart disease, stroke and respiratory failure. The options available for help with cessation ,the behavioral changes to assist the process, and the option to either gradully reduce usage  Or abruptly stop.is also discussed. Pt is also encouraged to set specific goals in number of cigarettes used daily, as well as to set a quit date.

## 2014-07-31 NOTE — Assessment & Plan Note (Signed)
Uncontrolled at this visit No change in meds at this time DASH diet and commitment to daily physical activity for a minimum of 30 minutes discussed and encouraged, as a part of hypertension management. The importance of attaining a healthy weight is also discussed. F/u in2 month

## 2014-07-31 NOTE — Assessment & Plan Note (Signed)
And uncontrolled with poor sleep due to recent death  Of her spouse's son, ativan short term to help with sleep

## 2014-07-31 NOTE — Assessment & Plan Note (Signed)
deteriorated due to recent passing of family member. No interest in therapy currently, wants to try to work through present situation with family support. wellbutrin started also with the hope of helping her to stop smoking

## 2014-07-31 NOTE — Assessment & Plan Note (Signed)
Unchanged. Patient re-educated about  the importance of commitment to a  minimum of 150 minutes of exercise per week. The importance of healthy food choices with portion control discussed. Encouraged to start a food diary, count calories and to consider  joining a support group. Sample diet sheets offered. Goals set by the patient for the next several months.    

## 2014-09-17 ENCOUNTER — Encounter: Payer: Self-pay | Admitting: Family Medicine

## 2014-09-27 ENCOUNTER — Other Ambulatory Visit: Payer: Self-pay | Admitting: Family Medicine

## 2014-09-27 DIAGNOSIS — Z1231 Encounter for screening mammogram for malignant neoplasm of breast: Secondary | ICD-10-CM

## 2014-10-04 ENCOUNTER — Ambulatory Visit (HOSPITAL_COMMUNITY)
Admission: RE | Admit: 2014-10-04 | Discharge: 2014-10-04 | Disposition: A | Discharge status: Home / Self Care | Payer: BC Managed Care – PPO | Source: Ambulatory Visit | Attending: Family Medicine | Admitting: Family Medicine

## 2014-10-04 DIAGNOSIS — Z1231 Encounter for screening mammogram for malignant neoplasm of breast: Secondary | ICD-10-CM | POA: Diagnosis present

## 2015-01-17 ENCOUNTER — Other Ambulatory Visit: Payer: Self-pay

## 2015-01-17 ENCOUNTER — Other Ambulatory Visit: Payer: Self-pay | Admitting: Family Medicine

## 2015-01-17 ENCOUNTER — Telehealth: Payer: Self-pay

## 2015-01-17 DIAGNOSIS — I10 Essential (primary) hypertension: Secondary | ICD-10-CM

## 2015-01-17 DIAGNOSIS — E785 Hyperlipidemia, unspecified: Secondary | ICD-10-CM

## 2015-01-17 DIAGNOSIS — R7303 Prediabetes: Secondary | ICD-10-CM

## 2015-01-17 MED ORDER — AMLODIPINE BESYLATE 5 MG PO TABS: 5.0000 mg | ORAL_TABLET | Freq: Every day | ORAL | Status: DC

## 2015-01-17 NOTE — Telephone Encounter (Signed)
Labs ordered and mailed.  Patient scheduled appointment for 02/2015

## 2015-03-09 LAB — LIPID PANEL
CHOLESTEROL: 169 mg/dL (ref 0–200)
HDL: 56 mg/dL (ref >=46)
LDL CALC: 105 mg/dL — AB (ref 0–99)
TRIGLYCERIDES: 40 mg/dL (ref <150)
Total CHOL/HDL Ratio: 3 Ratio
VLDL: 8 mg/dL (ref 0–40)

## 2015-03-09 LAB — BASIC METABOLIC PANEL
BUN: 16 mg/dL (ref 6–23)
CHLORIDE: 104 meq/L (ref 96–112)
CO2: 26 mEq/L (ref 19–32)
Calcium: 9.5 mg/dL (ref 8.4–10.5)
Creat: 0.92 mg/dL (ref 0.50–1.10)
GLUCOSE: 92 mg/dL (ref 70–99)
POTASSIUM: 4 meq/L (ref 3.5–5.3)
Sodium: 139 mEq/L (ref 135–145)

## 2015-03-10 LAB — CBC
HEMATOCRIT: 41.4 % (ref 36.0–46.0)
HEMOGLOBIN: 13.5 g/dL (ref 12.0–15.0)
MCH: 30.5 pg (ref 26.0–34.0)
MCHC: 32.6 g/dL (ref 30.0–36.0)
MCV: 93.7 fL (ref 78.0–100.0)
MPV: 10.7 fL (ref 8.6–12.4)
Platelets: 235 10*3/uL (ref 150–400)
RBC: 4.42 MIL/uL (ref 3.87–5.11)
RDW: 13.3 % (ref 11.5–15.5)
WBC: 5.8 10*3/uL (ref 4.0–10.5)

## 2015-03-10 LAB — HEMOGLOBIN A1C
Hgb A1c MFr Bld: 5.8 % — ABNORMAL HIGH (ref <5.7)
Mean Plasma Glucose: 120 mg/dL — ABNORMAL HIGH (ref <117)

## 2015-03-10 LAB — TSH: TSH: 1.745 u[IU]/mL (ref 0.350–4.500)

## 2015-03-11 ENCOUNTER — Encounter: Payer: Self-pay | Admitting: Family Medicine

## 2015-03-11 ENCOUNTER — Ambulatory Visit (INDEPENDENT_AMBULATORY_CARE_PROVIDER_SITE_OTHER): Payer: BLUE CROSS/BLUE SHIELD | Admitting: Family Medicine

## 2015-03-11 VITALS — BP 122/82 | HR 68 | Resp 18 | Ht 64.0 in | Wt 177.0 lb

## 2015-03-11 DIAGNOSIS — E559 Vitamin D deficiency, unspecified: Secondary | ICD-10-CM

## 2015-03-11 DIAGNOSIS — R7309 Other abnormal glucose: Secondary | ICD-10-CM

## 2015-03-11 DIAGNOSIS — I10 Essential (primary) hypertension: Secondary | ICD-10-CM

## 2015-03-11 DIAGNOSIS — E785 Hyperlipidemia, unspecified: Secondary | ICD-10-CM | POA: Diagnosis not present

## 2015-03-11 DIAGNOSIS — K219 Gastro-esophageal reflux disease without esophagitis: Secondary | ICD-10-CM

## 2015-03-11 DIAGNOSIS — Z566 Other physical and mental strain related to work: Secondary | ICD-10-CM

## 2015-03-11 DIAGNOSIS — E669 Obesity, unspecified: Secondary | ICD-10-CM

## 2015-03-11 DIAGNOSIS — F329 Major depressive disorder, single episode, unspecified: Secondary | ICD-10-CM

## 2015-03-11 DIAGNOSIS — F32A Depression, unspecified: Secondary | ICD-10-CM

## 2015-03-11 DIAGNOSIS — R7303 Prediabetes: Secondary | ICD-10-CM

## 2015-03-11 DIAGNOSIS — F17218 Nicotine dependence, cigarettes, with other nicotine-induced disorders: Secondary | ICD-10-CM | POA: Diagnosis not present

## 2015-03-11 MED ORDER — AMLODIPINE BESYLATE 5 MG PO TABS: 5.0000 mg | ORAL_TABLET | Freq: Every day | ORAL | Status: DC

## 2015-03-11 MED ORDER — QUETIAPINE FUMARATE ER 150 MG PO TB24: 150.0000 mg | ORAL_TABLET | Freq: Every day | ORAL | Status: DC

## 2015-03-11 NOTE — Assessment & Plan Note (Signed)
Daily vit D3 supplement recommended, f/u in 6 month

## 2015-03-11 NOTE — Assessment & Plan Note (Signed)
Improved and on no medication currently

## 2015-03-11 NOTE — Assessment & Plan Note (Signed)
Controlled, no change in medication DASH diet and commitment to daily physical activity for a minimum of 30 minutes discussed and encouraged, as a part of hypertension management. The importance of attaining a healthy weight is also discussed.  BP/Weight 03/11/2015 06/12/2014 09/06/2013 05/31/2013 05/15/2013 04/21/2013 7/93/9688  Systolic BP 648 472 072 182 883 374 451  Diastolic BP 82 90 84 82 73 96 76  Wt. (Lbs) 177 198.4 193 189 - 185 182.6  BMI 30.37 34.04 33.11 32.43 - 31.74 31.33

## 2015-03-11 NOTE — Assessment & Plan Note (Signed)
Hyperlipidemia:Low fat diet discussed and encouraged.   Lipid Panel  Lab Results  Component Value Date   CHOL 169 03/09/2015   HDL 56 03/09/2015   LDLCALC 105* 03/09/2015   TRIG 40 03/09/2015   CHOLHDL 3.0 03/09/2015  Improved, pt applauded on this

## 2015-03-11 NOTE — Assessment & Plan Note (Signed)
Marked improvement since last seen, states she is dealing with stress in a different way, learning not to stress over what is out of her control, uses seroquel only on as needed basis

## 2015-03-11 NOTE — Assessment & Plan Note (Signed)
Current is 7 per day. Ready to [place] Patient counseled for approximately 5 minutes regarding the health risks of ongoing nicotine use, specifically all types of cancer, heart disease, stroke and respiratory failure. The options available for help with cessation ,the behavioral changes to assist the process, and the option to either gradully reduce usage  Or abruptly stop.is also discussed. Pt is also encouraged to set specific goals in number of cigarettes used daily, as well as to set a quit date.  Number of cigarettes/cigars currently smoking daily: 7

## 2015-03-11 NOTE — Patient Instructions (Addendum)
CPE in  6 month, call if you need me before  CONGRATS on weight loss, keep it up  Date to quitting cigarettes TOTALLY is July 4, pls call [phone]           QUIT NOW for free help 24 hrs per day   Blod sugar is better, great, bad cholesterol slightly high, work on both please  Add 46mins each day for the other 4 days of the week  Blood pressure is excellent  Start daily vit D3 (OTC) 800 IU one daily  Fasting lipid , chem 7, HBA1C, and vit D in 5.5 month  Nicotine Addiction Nicotine can act as both a stimulant (excites/activates) and a sedative (calms/quiets). Immediately after exposure to nicotine, there is a "kick" caused in part by the drug's stimulation of the adrenal glands and resulting discharge of adrenaline (epinephrine). The rush of adrenaline stimulates the body and causes a sudden release of sugar. This means that smokers are always slightly hyperglycemic. Hyperglycemic means that the blood sugar is high, just like in diabetics. Nicotine also decreases the amount of insulin which helps control sugar levels in the body. There is an increase in blood pressure, breathing, and the rate of heart beats.  In addition, nicotine indirectly causes a release of dopamine in the brain that controls pleasure and motivation. A similar reaction is seen with other drugs of abuse, such as cocaine and heroin. This dopamine release is thought to cause the pleasurable sensations when smoking. In some different cases, nicotine can also create a calming effect, depending on sensitivity of the smoker's nervous system and the dose of nicotine taken. WHAT HAPPENS WHEN NICOTINE IS TAKEN FOR LONG PERIODS OF TIME?  Long-term use of nicotine results in addiction. It is difficult to stop.  Repeated use of nicotine creates tolerance. Higher doses of nicotine are needed to get the "kick." When nicotine use is stopped, withdrawal may last a month or more. Withdrawal may begin within a few hours after the last  cigarette. Symptoms peak within the first few days and may lessen within a few weeks. For some people, however, symptoms may last for months or longer. Withdrawal symptoms include:   Irritability.  Craving.  Learning and attention deficits.  Sleep disturbances.  Increased appetite. Craving for tobacco may last for 6 months or longer. Many behaviors done while using nicotine can also play a part in the severity of withdrawal symptoms. For some people, the feel, smell, and sight of a cigarette and the ritual of obtaining, handling, lighting, and smoking the cigarette are closely linked with the pleasure of smoking. When stopped, they also miss the related behaviors which make the withdrawal or craving worse. While nicotine gum and patches may lessen the drug aspects of withdrawal, cravings often persist. WHAT ARE THE MEDICAL CONSEQUENCES OF NICOTINE USE?  Nicotine addiction accounts for one-third of all cancers. The top cancer caused by tobacco is lung cancer. Lung cancer is the number one cancer killer of both men and women.  Smoking is also associated with cancers of the:  Mouth.  Pharynx.  Larynx.  Esophagus.  Stomach.  Pancreas.  Cervix.  Kidney.  Ureter.  Bladder.  Smoking also causes lung diseases such as lasting (chronic) bronchitis and emphysema.  It worsens asthma in adults and children.  Smoking increases the risk of heart disease, including:  Stroke.  Heart attack.  Vascular disease.  Aneurysm.  Passive or secondary smoke can also increase medical risks including:  Asthma in children.  Sudden Infant Death Syndrome (SIDS).  Additionally, dropped cigarettes are the leading cause of residential fire fatalities.  Nicotine poisoning has been reported from accidental ingestion of tobacco products by children and pets. Death usually results in a few minutes from respiratory failure (when a person stops breathing) caused by paralysis. TREATMENT    Medication. Nicotine replacement medicines such as nicotine gum and the patch are used to stop smoking. These medicines gradually lower the dosage of nicotine in the body. These medicines do not contain the carbon monoxide and other toxins found in tobacco smoke.  Hypnotherapy.  Relaxation therapy.  Nicotine Anonymous (a 12-step support program). Find times and locations in your local yellow pages. Document Released: 07/08/2004 Document Revised: 01/25/2012 Document Reviewed: 12/29/2013 [facility] Patient Information 2015 [place], [place]. This information is not intended to replace advice given to you by your health care provider. Make sure you discuss any questions you have with your health care provider.

## 2015-03-11 NOTE — Progress Notes (Signed)
Subjective:    Patient ID: Lydia Perez, female    DOB: [birth year], 59 y.o.   MRN: [id number]  HPI    Lydia Perez     MRN: [id number]      DOB: 1956/05/28   HPI Lydia Perez is here for follow up and re-evaluation of chronic medical conditions, medication management and review of any available recent lab and radiology data.  Preventive health is updated, specifically  Cancer screening and Immunization.   Questions or concerns regarding consultations or procedures which the PT has had in the interim are  addressed. The PT denies any adverse reactions to current medications since the last visit.  There are no new concerns.  There are no specific complaints   ROS Denies recent fever or chills. Denies sinus pressure, nasal congestion, ear pain or sore throat. Denies chest congestion, productive cough or wheezing. Denies chest pains, palpitations and leg swelling Denies abdominal pain, nausea, vomiting,diarrhea or constipation.   Denies dysuria, frequency, hesitancy or incontinence. Denies joint pain, swelling and limitation in mobility. Denies headaches, seizures, numbness, or tingling. Denies depression, anxiety or insomnia. Denies skin break down or rash.   PE  BP 122/82 mmHg  Pulse 68  Resp 18  Ht 5\' 4"  (1.626 m)  Wt 177 lb (80.287 kg)  BMI 30.37 kg/m2  SpO2 98%  Patient alert and oriented and in no cardiopulmonary distress.  HEENT: No facial asymmetry, EOMI,   oropharynx pink and moist.  Neck supple no JVD, no mass.  Chest: Clear to auscultation bilaterally.  CVS: S1, S2 no murmurs, no S3.Regular rate.  ABD: Soft non tender.   Ext: No edema  MS: Adequate ROM spine, shoulders, hips and knees.  Skin: Intact, no ulcerations or rash noted.  Psych: Good eye contact, normal affect. Memory intact not anxious or depressed appearing.  CNS: CN 2-12 intact, power,  normal throughout.no focal deficits noted.   Assessment & Plan   Essential  hypertension Controlled, no change in medication DASH diet and commitment to daily physical activity for a minimum of 30 minutes discussed and encouraged, as a part of hypertension management. The importance of attaining a healthy weight is also discussed.  BP/Weight 03/11/2015 06/12/2014 09/06/2013 05/31/2013 05/15/2013 04/21/2013 9/32/6712  Systolic BP 458 099 833 825 053 976 734  Diastolic BP 82 90 84 82 73 96 76  Wt. (Lbs) 177 198.4 193 189 - 185 182.6  BMI 30.37 34.04 33.11 32.43 - 31.74 31.33         Nicotine dependence Current is 7 per day. Ready to [place] Patient counseled for approximately 5 minutes regarding the health risks of ongoing nicotine use, specifically all types of cancer, heart disease, stroke and respiratory failure. The options available for help with cessation ,the behavioral changes to assist the process, and the option to either gradully reduce usage  Or abruptly stop.is also discussed. Pt is also encouraged to set specific goals in number of cigarettes used daily, as well as to set a quit date.  Number of cigarettes/cigars currently smoking daily: 7    Depression Marked improvement since last seen, states she is dealing with stress in a different way, learning not to stress over what is out of her control, uses seroquel only on as needed basis   Dyslipidemia Hyperlipidemia:Low fat diet discussed and encouraged.   Lipid Panel  Lab Results  Component Value Date   CHOL 169 03/09/2015   HDL 56 03/09/2015   LDLCALC 105* 03/09/2015   TRIG  40 03/09/2015   CHOLHDL 3.0 03/09/2015  Improved, pt applauded on this        Obesity (BMI 30.0-34.9) Improved. Educated about  the importance of commitment to a  minimum of 150 minutes of exercise per week.  The importance of healthy food choices with portion control discussed. Encouraged to start a food diary, count calories and to consider  joining a support group. Sample diet sheets offered. Goals set by the  patient for the next several months.   Weight /BMI 03/11/2015 06/12/2014 09/06/2013  WEIGHT 177 lb 198 lb 6.4 oz 193 lb  HEIGHT 5\' 4"  5\' 4"  5\' 4"   BMI 30.37 kg/m2 34.04 kg/m2 33.11 kg/m2    Current exercise per week *150** minutes.    Prediabetes Improved. Patient educated about the importance of limiting  Carbohydrate intake , the need to commit to daily physical activity for a minimum of 30 minutes , and to commit weight loss. The fact that changes in all these areas will reduce or eliminate all together the development of diabetes is stressed.   Diabetic Labs Latest Ref Rng 03/09/2015 09/09/2013 04/21/2013 08/19/2012 12/15/2011  HbA1c <5.7 % 5.8(H) 5.9(H) 5.9(H) 5.6 5.9(H)  Chol 0 - 200 mg/dL 169 158 - - 175  HDL >=46 mg/dL 56 48 - - 56  Calc LDL 0 - 99 mg/dL 105(H) 103(H) - - 112(H)  Triglycerides <150 mg/dL 40 37 - - 34  Creatinine 0.50 - 1.10 mg/dL 0.92 0.98 0.88 0.98 0.90   BP/Weight 03/11/2015 06/12/2014 09/06/2013 05/31/2013 05/15/2013 04/21/2013 2/87/8676  Systolic BP 720 947 096 283 662 947 654  Diastolic BP 82 90 84 82 73 96 76  Wt. (Lbs) 177 198.4 193 189 - 185 182.6  BMI 30.37 34.04 33.11 32.43 - 31.74 31.33   No flowsheet data found.      GERD Improved and on no medication currently   Stress at work Reports improvement in how she handles it , so even if unchanged, she is coping much better   Vitamin D deficiency Daily vit D3 supplement recommended, f/u in 6 month       Review of Systems     Objective:   Physical Exam        Assessment & Plan:

## 2015-03-11 NOTE — Assessment & Plan Note (Addendum)
Improved. Educated about  the importance of commitment to a  minimum of 150 minutes of exercise per week.  The importance of healthy food choices with portion control discussed. Encouraged to start a food diary, count calories and to consider  joining a support group. Sample diet sheets offered. Goals set by the patient for the next several months.   Weight /BMI 03/11/2015 06/12/2014 09/06/2013  WEIGHT 177 lb 198 lb 6.4 oz 193 lb  HEIGHT 5\' 4"  5\' 4"  5\' 4"   BMI 30.37 kg/m2 34.04 kg/m2 33.11 kg/m2    Current exercise per week *150** minutes.

## 2015-03-11 NOTE — Assessment & Plan Note (Signed)
Improved. Patient educated about the importance of limiting  Carbohydrate intake , the need to commit to daily physical activity for a minimum of 30 minutes , and to commit weight loss. The fact that changes in all these areas will reduce or eliminate all together the development of diabetes is stressed.   Diabetic Labs Latest Ref Rng 03/09/2015 09/09/2013 04/21/2013 08/19/2012 12/15/2011  HbA1c <5.7 % 5.8(H) 5.9(H) 5.9(H) 5.6 5.9(H)  Chol 0 - 200 mg/dL 169 158 - - 175  HDL >=46 mg/dL 56 48 - - 56  Calc LDL 0 - 99 mg/dL 105(H) 103(H) - - 112(H)  Triglycerides <150 mg/dL 40 37 - - 34  Creatinine 0.50 - 1.10 mg/dL 0.92 0.98 0.88 0.98 0.90   BP/Weight 03/11/2015 06/12/2014 09/06/2013 05/31/2013 05/15/2013 04/21/2013 1/32/4401  Systolic BP 027 253 664 403 474 259 563  Diastolic BP 82 90 84 82 73 96 76  Wt. (Lbs) 177 198.4 193 189 - 185 182.6  BMI 30.37 34.04 33.11 32.43 - 31.74 31.33   No flowsheet data found.

## 2015-03-11 NOTE — Assessment & Plan Note (Signed)
Reports improvement in how she handles it , so even if unchanged, she is coping much better

## 2015-04-25 ENCOUNTER — Other Ambulatory Visit: Payer: Self-pay

## 2015-04-25 DIAGNOSIS — I10 Essential (primary) hypertension: Secondary | ICD-10-CM

## 2015-04-25 MED ORDER — AMLODIPINE BESYLATE 5 MG PO TABS: 5.0000 mg | ORAL_TABLET | Freq: Every day | ORAL | Status: DC

## 2015-06-12 ENCOUNTER — Telehealth: Payer: Self-pay

## 2015-06-12 ENCOUNTER — Other Ambulatory Visit: Payer: Self-pay

## 2015-06-12 DIAGNOSIS — M79674 Pain in right toe(s): Secondary | ICD-10-CM

## 2015-06-12 NOTE — Telephone Encounter (Signed)
Needs to see podiatry so that I can have established diagnosis and treatment plan on record before this can be provided, pls refer if she agrees I will sign

## 2015-06-12 NOTE — Telephone Encounter (Signed)
Spoke with patient and she is ok with referral as long as it is local.  Referral entered.

## 2015-06-12 NOTE — Addendum Note (Signed)
Addended by: Denman George B on: 06/12/2015 09:29 AM   Modules accepted: Orders

## 2015-07-03 ENCOUNTER — Ambulatory Visit (HOSPITAL_COMMUNITY)
Admission: RE | Admit: 2015-07-03 | Discharge: 2015-07-03 | Disposition: A | Discharge status: Home / Self Care | Payer: BLUE CROSS/BLUE SHIELD | Source: Ambulatory Visit | Attending: Family Medicine | Admitting: Family Medicine

## 2015-07-03 ENCOUNTER — Ambulatory Visit (INDEPENDENT_AMBULATORY_CARE_PROVIDER_SITE_OTHER): Payer: BLUE CROSS/BLUE SHIELD | Admitting: Family Medicine

## 2015-07-03 ENCOUNTER — Encounter: Payer: Self-pay | Admitting: Family Medicine

## 2015-07-03 VITALS — BP 118/80 | HR 71 | Resp 16 | Ht 64.0 in | Wt 171.4 lb

## 2015-07-03 DIAGNOSIS — R7303 Prediabetes: Secondary | ICD-10-CM

## 2015-07-03 DIAGNOSIS — F32A Depression, unspecified: Secondary | ICD-10-CM

## 2015-07-03 DIAGNOSIS — F329 Major depressive disorder, single episode, unspecified: Secondary | ICD-10-CM

## 2015-07-03 DIAGNOSIS — M25511 Pain in right shoulder: Secondary | ICD-10-CM | POA: Diagnosis not present

## 2015-07-03 DIAGNOSIS — R7309 Other abnormal glucose: Secondary | ICD-10-CM | POA: Diagnosis not present

## 2015-07-03 DIAGNOSIS — M546 Pain in thoracic spine: Secondary | ICD-10-CM | POA: Diagnosis not present

## 2015-07-03 DIAGNOSIS — E785 Hyperlipidemia, unspecified: Secondary | ICD-10-CM

## 2015-07-03 DIAGNOSIS — M542 Cervicalgia: Secondary | ICD-10-CM | POA: Diagnosis not present

## 2015-07-03 DIAGNOSIS — F17208 Nicotine dependence, unspecified, with other nicotine-induced disorders: Secondary | ICD-10-CM

## 2015-07-03 DIAGNOSIS — E66811 Obesity, class 1: Secondary | ICD-10-CM

## 2015-07-03 DIAGNOSIS — E669 Obesity, unspecified: Secondary | ICD-10-CM

## 2015-07-03 DIAGNOSIS — Z1159 Encounter for screening for other viral diseases: Secondary | ICD-10-CM

## 2015-07-03 DIAGNOSIS — I10 Essential (primary) hypertension: Secondary | ICD-10-CM | POA: Diagnosis not present

## 2015-07-03 MED ORDER — IBUPROFEN 800 MG PO TABS: 800.0000 mg | ORAL_TABLET | Freq: Three times a day (TID) | ORAL | Status: DC | PRN

## 2015-07-03 MED ORDER — METHYLPREDNISOLONE ACETATE 80 MG/ML IJ SUSP
80.0000 mg | Freq: Once | INTRAMUSCULAR | Status: AC
  Administered 2015-07-03: 80 mg via INTRAMUSCULAR

## 2015-07-03 MED ORDER — KETOROLAC TROMETHAMINE 60 MG/2ML IM SOLN
60.0000 mg | Freq: Once | INTRAMUSCULAR | Status: AC
  Administered 2015-07-03: 60 mg via INTRAMUSCULAR

## 2015-07-03 MED ORDER — GABAPENTIN 300 MG PO CAPS: 300.0000 mg | ORAL_CAPSULE | Freq: Three times a day (TID) | ORAL | Status: DC

## 2015-07-03 MED ORDER — PREDNISONE 5 MG (21) PO TBPK: 5.0000 mg | ORAL_TABLET | ORAL | Status: DC

## 2015-07-03 NOTE — Progress Notes (Signed)
Subjective:    Patient ID: Lydia Perez, female    DOB: [birth year], 59 y.o.   MRN: [id number]  HPI The PT is here for follow up and re-evaluation of chronic medical conditions, medication management and review of any available recent lab and radiology data.  Preventive health is updated, specifically  Cancer screening and Immunization.   Questions or concerns regarding consultations or procedures which the PT has had in the interim are  addressed. The PT denies any adverse reactions to current medications since the last visit.  3 week h/o localized upper back pain, sensitive to touch, limits  mobility in neck, radiates to  Right arm     Review of Systems See HPI Denies recent fever or chills. Denies sinus pressure, nasal congestion, ear pain or sore throat. Denies chest congestion, productive cough or wheezing. Denies chest pains, palpitations and leg swelling Denies abdominal pain, nausea, vomiting,diarrhea or constipation.   Denies dysuria, frequency, hesitancy or incontinence. . Denies headaches, seizures. Denies uncontrolled  depression, anxiety or insomnia. Denies skin break down or rash.        Objective:   Physical Exam  Patient alert and oriented and in no cardiopulmonary distress.  HEENT: No facial asymmetry, EOMI,   oropharynx pink and moist.  Neck decreased ROM, no JVD, no mass.  Chest: Clear to auscultation bilaterally.  CVS: S1, S2 no murmurs, no S3.Regular rate.  ABD: Soft non tender.   Ext: No edema  WU:[id number]  ROM thoracic and cervical  spine, shoulders, hips and knees.  Skin: Intact, no ulcerations or rash noted.  Psych: Good eye contact, normal affect. Memory intact not anxious or depressed appearing.  CNS: CN 2-12 intact, power,  normal throughout.no focal deficits noted.       Assessment & Plan:  Thoracic spine pain Uncontrolled.Toradol and depo medrol administered IM in the office , to be followed by a short course of oral  prednisone and NSAIDS. X ray today  Nicotine dependence Patient counseled for approximately 5 minutes regarding the health risks of ongoing nicotine use, specifically all types of cancer, heart disease, stroke and respiratory failure. The options available for help with cessation ,the behavioral changes to assist the process, and the option to either gradully reduce usage  Or abruptly stop.is also discussed. Pt is also encouraged to set specific goals in number of cigarettes used daily, as well as to set a quit date.  Number of cigarettes/cigars currently smoking daily: 10    Prediabetes Patient educated about the importance of limiting  Carbohydrate intake , the need to commit to daily physical activity for a minimum of 30 minutes , and to commit weight loss. The fact that changes in all these areas will reduce or eliminate all together the development of diabetes is stressed.  Improved  Diabetic Labs Latest Ref Rng 03/09/2015 09/09/2013 04/21/2013 08/19/2012 12/15/2011  HbA1c <5.7 % 5.8(H) 5.9(H) 5.9(H) 5.6 5.9(H)  Chol 0 - 200 mg/dL 169 158 - - 175  HDL >=46 mg/dL 56 48 - - 56  Calc LDL 0 - 99 mg/dL 105(H) 103(H) - - 112(H)  Triglycerides <150 mg/dL 40 37 - - 34  Creatinine 0.50 - 1.10 mg/dL 0.92 0.98 0.88 0.98 0.90   BP/Weight 07/03/2015 03/11/2015 06/12/2014 09/06/2013 05/31/2013 2/95/6213 0/06/6577  Systolic BP 469 629 528 413 244 010 272  Diastolic BP 80 82 90 84 82 73 96  Wt. (Lbs) 171.4 177 198.4 193 189 - 185  BMI 29.41 30.37 34.04 33.11 32.43 -  31.74   No flowsheet data found.     Essential hypertension Controlled, no change in medication DASH diet and commitment to daily physical activity for a minimum of 30 minutes discussed and encouraged, as a part of hypertension management. The importance of attaining a healthy weight is also discussed.  BP/Weight 07/03/2015 03/11/2015 06/12/2014 09/06/2013 05/31/2013 0/24/0973 03/19/2991  Systolic BP 426 834 196 222 979 892 119  Diastolic  BP 80 82 90 84 82 73 96  Wt. (Lbs) 171.4 177 198.4 193 189 - 185  BMI 29.41 30.37 34.04 33.11 32.43 - 31.74        Obesity (BMI 30.0-34.9) Improved. Patient re-educated about  the importance of commitment to a  minimum of 150 minutes of exercise per week.  The importance of healthy food choices with portion control discussed. Encouraged to start a food diary, count calories and to consider  joining a support group. Sample diet sheets offered. Goals set by the patient for the next several months.   Weight /BMI 07/03/2015 03/11/2015 06/12/2014  WEIGHT 171 lb 6.4 oz 177 lb 198 lb 6.4 oz  HEIGHT 5\' 4"  5\' 4"  5\' 4"   BMI 29.41 kg/m2 30.37 kg/m2 34.04 kg/m2    Current exercise per week 150 minutes.   Depression Controlled, no change in medication

## 2015-07-03 NOTE — Patient Instructions (Addendum)
Annual exam as before , call if you need me sooner please  Non fast hepatitis screen, HIV, Chem 7 , HBA1C fo next visit  You are treated for neck and upper back pain which I believe is due to arthritis with possible nerve pressure  X rays today, injections in office today, and medication sent in. Take ibuprofen three times daily for week then as needed  Pls call back in 2 weeks if no better  Quit smoke date is September 1  Smoking Cessation Quitting smoking is important to your health and has many advantages. However, it is not always easy to quit since nicotine is a very addictive drug. Oftentimes, people try 3 times or more before being able to quit. This document explains the best ways for you to prepare to quit smoking. Quitting takes hard work and a lot of effort, but you can do it. ADVANTAGES OF QUITTING SMOKING  You will live longer, feel better, and live better.  Your body will feel the impact of quitting smoking almost immediately.  Within 20 minutes, blood pressure decreases. Your pulse returns to its normal level.  After 8 hours, carbon monoxide levels in the blood return to normal. Your oxygen level increases.  After 24 hours, the chance of having a heart attack starts to decrease. Your breath, hair, and body stop smelling like smoke.  After 48 hours, damaged nerve endings begin to recover. Your sense of taste and smell improve.  After 72 hours, the body is virtually free of nicotine. Your bronchial tubes relax and breathing becomes easier.  After 2 to 12 weeks, lungs can hold more air. Exercise becomes easier and circulation improves.  The risk of having a heart attack, stroke, cancer, or lung disease is greatly reduced.  After 1 year, the risk of coronary heart disease is cut in half.  After 5 years, the risk of stroke falls to the same as a nonsmoker.  After 10 years, the risk of lung cancer is cut in half and the risk of other cancers decreases  significantly.  After 15 years, the risk of coronary heart disease drops, usually to the level of a nonsmoker.  If you are pregnant, quitting smoking will improve your chances of having a healthy baby.  The people you live with, especially any children, will be healthier.  You will have extra money to spend on things other than cigarettes. QUESTIONS TO THINK ABOUT BEFORE ATTEMPTING TO QUIT You may want to talk about your answers with your health care provider.  Why do you want to quit?  If you tried to quit in the past, what helped and what did not?  What will be the most difficult situations for you after you quit? How will you plan to handle them?  Who can help you through the tough times? Your family? Friends? A health care provider?  What pleasures do you get from smoking? What ways can you still get pleasure if you quit? Here are some questions to ask your health care provider:  How can you help me to be successful at quitting?  What medicine do you think would be best for me and how should I take it?  What should I do if I need more help?  What is smoking withdrawal like? How can I get information on withdrawal? GET READY  Set a quit date.  Change your environment by getting rid of all cigarettes, ashtrays, matches, and lighters in your home, car, or work. Do not let  people smoke in your home.  Review your past attempts to quit. Think about what worked and what did not. GET SUPPORT AND ENCOURAGEMENT You have a better chance of being successful if you have help. You can get support in many ways.  Tell your family, friends, and coworkers that you are going to quit and need their support. Ask them not to smoke around you.  Get individual, group, or telephone counseling and support. Programs are available at [employer] and health centers. Call your local health department for information about programs in your area.  Spiritual beliefs and practices may help some  smokers quit.  Download a "quit meter" on your computer to keep track of quit statistics, such as how long you have gone without smoking, cigarettes not smoked, and money saved.  Get a self-help book about quitting smoking and staying off tobacco. [place] yourself from urges to smoke. Talk to someone, go for a walk, or occupy your time with a task.  Change your normal routine. Take a different route to work. Drink tea instead of coffee. Eat breakfast in a different place.  Reduce your stress. Take a hot bath, exercise, or read a book.  Plan something enjoyable to do every day. Reward yourself for not smoking.  Explore interactive web-based programs that specialize in helping you quit. GET MEDICINE AND USE IT CORRECTLY Medicines can help you stop smoking and decrease the urge to smoke. Combining medicine with the above behavioral methods and support can greatly increase your chances of successfully quitting smoking.  Nicotine replacement therapy helps deliver nicotine to your body without the negative effects and risks of smoking. Nicotine replacement therapy includes nicotine gum, lozenges, inhalers, nasal sprays, and skin patches. Some may be available over-the-counter and others require a prescription.  Antidepressant medicine helps people abstain from smoking, but how this works is unknown. This medicine is available by prescription.  Nicotinic receptor partial agonist medicine simulates the effect of nicotine in your brain. This medicine is available by prescription. Ask your health care provider for advice about which medicines to use and how to use them based on your health history. Your health care provider will tell you what side effects to look out for if you choose to be on a medicine or therapy. Carefully read the information on the package. Do not use any other product containing nicotine while using a nicotine replacement product.  RELAPSE OR  DIFFICULT SITUATIONS Most relapses occur within the first 3 months after quitting. Do not be discouraged if you start smoking again. Remember, most people try several times before finally quitting. You may have symptoms of withdrawal because your body is used to nicotine. You may crave cigarettes, be irritable, feel very hungry, cough often, get headaches, or have difficulty concentrating. The withdrawal symptoms are only temporary. They are strongest when you first quit, but they will go away within 10-14 days. To reduce the chances of relapse, try to:  Avoid drinking alcohol. Drinking lowers your chances of successfully quitting.  Reduce the amount of caffeine you consume. Once you quit smoking, the amount of caffeine in your body increases and can give you symptoms, such as a rapid heartbeat, sweating, and anxiety.  Avoid smokers because they can make you want to smoke.  Do not let weight gain distract you. Many smokers will gain weight when they quit, usually less than 10 pounds. Eat a healthy diet and stay active. You can always lose the weight  gained after you quit.  Find ways to improve your mood other than smoking. FOR MORE INFORMATION  www.smokefree.gov  Document Released: 10/27/2001 Document Revised: 03/19/2014 Document Reviewed: 02/11/2012 [facility] Patient Information 2015 [place], [place]. This information is not intended to replace advice given to you by your health care provider. Make sure you discuss any questions you have with your health care provider.

## 2015-08-11 NOTE — Assessment & Plan Note (Signed)
Controlled, no change in medication  

## 2015-08-11 NOTE — Assessment & Plan Note (Signed)
Uncontrolled.Toradol and depo medrol administered IM in the office , to be followed by a short course of oral prednisone and NSAIDS. X ray today

## 2015-08-11 NOTE — Assessment & Plan Note (Signed)
Controlled, no change in medication DASH diet and commitment to daily physical activity for a minimum of 30 minutes discussed and encouraged, as a part of hypertension management. The importance of attaining a healthy weight is also discussed.  BP/Weight 07/03/2015 03/11/2015 06/12/2014 09/06/2013 05/31/2013 02/02/2333 01/19/6860  Systolic BP 683 729 021 115 520 802 233  Diastolic BP 80 82 90 84 82 73 96  Wt. (Lbs) 171.4 177 198.4 193 189 - 185  BMI 29.41 30.37 34.04 33.11 32.43 - 31.74

## 2015-08-11 NOTE — Assessment & Plan Note (Signed)

## 2015-08-11 NOTE — Assessment & Plan Note (Signed)
Patient educated about the importance of limiting  Carbohydrate intake , the need to commit to daily physical activity for a minimum of 30 minutes , and to commit weight loss. The fact that changes in all these areas will reduce or eliminate all together the development of diabetes is stressed.  Improved  Diabetic Labs Latest Ref Rng 03/09/2015 09/09/2013 04/21/2013 08/19/2012 12/15/2011  HbA1c <5.7 % 5.8(H) 5.9(H) 5.9(H) 5.6 5.9(H)  Chol 0 - 200 mg/dL 169 158 - - 175  HDL >=46 mg/dL 56 48 - - 56  Calc LDL 0 - 99 mg/dL 105(H) 103(H) - - 112(H)  Triglycerides <150 mg/dL 40 37 - - 34  Creatinine 0.50 - 1.10 mg/dL 0.92 0.98 0.88 0.98 0.90   BP/Weight 07/03/2015 03/11/2015 06/12/2014 09/06/2013 05/31/2013 8/46/9629 03/17/8412  Systolic BP 244 010 272 536 644 034 742  Diastolic BP 80 82 90 84 82 73 96  Wt. (Lbs) 171.4 177 198.4 193 189 - 185  BMI 29.41 30.37 34.04 33.11 32.43 - 31.74   No flowsheet data found.

## 2015-08-11 NOTE — Assessment & Plan Note (Signed)
Improved. Patient re-educated about  the importance of commitment to a  minimum of 150 minutes of exercise per week.  The importance of healthy food choices with portion control discussed. Encouraged to start a food diary, count calories and to consider  joining a support group. Sample diet sheets offered. Goals set by the patient for the next several months.   Weight /BMI 07/03/2015 03/11/2015 06/12/2014  WEIGHT 171 lb 6.4 oz 177 lb 198 lb 6.4 oz  HEIGHT 5\' 4"  5\' 4"  5\' 4"   BMI 29.41 kg/m2 30.37 kg/m2 34.04 kg/m2    Current exercise per week 150 minutes.

## 2015-09-10 ENCOUNTER — Encounter: Payer: BLUE CROSS/BLUE SHIELD | Admitting: Family Medicine

## 2015-10-29 ENCOUNTER — Telehealth: Payer: Self-pay | Admitting: Family Medicine

## 2015-10-29 NOTE — Telephone Encounter (Signed)
Please advise 

## 2015-10-29 NOTE — Telephone Encounter (Signed)
ecent x ry of neck was normal. Needs to reschedule and keep her annual wellness which she recently missed , will discuss further at that visit

## 2015-10-29 NOTE — Telephone Encounter (Signed)
Patient is asking if Dr. Moshe Cipro would order her an MRI for lower neck pain that is radiating into her chest, she states that it has been this way for a few months now, please advise?

## 2015-10-30 LAB — BASIC METABOLIC PANEL
BUN: 16 mg/dL (ref 7–25)
CO2: 28 mmol/L (ref 20–31)
CREATININE: 0.98 mg/dL (ref 0.50–1.05)
Calcium: 9.4 mg/dL (ref 8.6–10.4)
Chloride: 105 mmol/L (ref 98–110)
GLUCOSE: 90 mg/dL (ref 65–99)
Potassium: 4.1 mmol/L (ref 3.5–5.3)
SODIUM: 140 mmol/L (ref 135–146)

## 2015-10-30 LAB — HIV ANTIBODY (ROUTINE TESTING W REFLEX): HIV 1&2 Ab, 4th Generation: NONREACTIVE

## 2015-10-30 LAB — HEMOGLOBIN A1C
Hgb A1c MFr Bld: 5.6 % (ref <5.7)
MEAN PLASMA GLUCOSE: 114 mg/dL (ref <117)

## 2015-10-30 LAB — HEPATITIS C ANTIBODY: HCV Ab: NEGATIVE

## 2015-10-31 NOTE — Telephone Encounter (Signed)
Patient given appt for Jan to discuss

## 2015-11-13 ENCOUNTER — Ambulatory Visit (INDEPENDENT_AMBULATORY_CARE_PROVIDER_SITE_OTHER): Payer: BLUE CROSS/BLUE SHIELD | Admitting: Family Medicine

## 2015-11-13 ENCOUNTER — Telehealth: Payer: Self-pay | Admitting: Family Medicine

## 2015-11-13 ENCOUNTER — Encounter: Payer: Self-pay | Admitting: Family Medicine

## 2015-11-13 VITALS — BP 112/68 | HR 80 | Temp 100.4°F | Resp 18 | Ht 64.0 in | Wt 166.0 lb

## 2015-11-13 DIAGNOSIS — I10 Essential (primary) hypertension: Secondary | ICD-10-CM

## 2015-11-13 DIAGNOSIS — J209 Acute bronchitis, unspecified: Secondary | ICD-10-CM

## 2015-11-13 DIAGNOSIS — F17218 Nicotine dependence, cigarettes, with other nicotine-induced disorders: Secondary | ICD-10-CM | POA: Diagnosis not present

## 2015-11-13 DIAGNOSIS — J0100 Acute maxillary sinusitis, unspecified: Secondary | ICD-10-CM | POA: Diagnosis not present

## 2015-11-13 DIAGNOSIS — E669 Obesity, unspecified: Secondary | ICD-10-CM

## 2015-11-13 DIAGNOSIS — E66811 Obesity, class 1: Secondary | ICD-10-CM

## 2015-11-13 MED ORDER — SULFAMETHOXAZOLE-TRIMETHOPRIM 800-160 MG PO TABS: 1.0000 | ORAL_TABLET | Freq: Two times a day (BID) | ORAL | Status: DC

## 2015-11-13 MED ORDER — BENZONATATE 100 MG PO CAPS: 100.0000 mg | ORAL_CAPSULE | Freq: Three times a day (TID) | ORAL | Status: DC | PRN

## 2015-11-13 MED ORDER — CEFTRIAXONE SODIUM 1 G IJ SOLR
500.0000 mg | Freq: Once | INTRAMUSCULAR | Status: AC
  Administered 2015-11-13: 500 mg via INTRAMUSCULAR

## 2015-11-13 MED ORDER — PROMETHAZINE-DM 6.25-15 MG/5ML PO SYRP: 5.0000 mL | ORAL_SOLUTION | Freq: Every evening | ORAL | Status: DC | PRN

## 2015-11-13 NOTE — Patient Instructions (Signed)
F/u as before  You are treated for acute sinusitis and bronchitis.  Antibiotic in office today, decongestant, doxycycline and cough suppressant prescribed  Work excuse from today to return i on Nov 19, 2015  CXR today  Acute Bronchitis Bronchitis is when the airways that extend from the windpipe into the lungs get red, puffy, and painful (inflamed). Bronchitis often causes thick spit (mucus) to develop. This leads to a cough. A cough is the most common symptom of bronchitis. In acute bronchitis, the condition usually begins suddenly and goes away over time (usually in 2 weeks). Smoking, allergies, and asthma can make bronchitis worse. Repeated episodes of bronchitis may cause more lung problems. HOME CARE  Rest.  Drink enough fluids to keep your pee (urine) clear or pale yellow (unless you need to limit fluids as told by your doctor).  Only take over-the-counter or prescription medicines as told by your doctor.  Avoid smoking and secondhand smoke. These can make bronchitis worse. If you are a smoker, think about using nicotine gum or skin patches. Quitting smoking will help your lungs heal faster.  Reduce the chance of getting bronchitis again by:  Washing your hands often.  Avoiding people with cold symptoms.  Trying not to touch your hands to your mouth, nose, or eyes.  Follow up with your doctor as told. GET HELP IF: Your symptoms do not improve after 1 week of treatment. Symptoms include:  Cough.  Fever.  Coughing up thick spit.  Body aches.  Chest congestion.  Chills.  Shortness of breath.  Sore throat. GET HELP RIGHT AWAY IF:   You have an increased fever.  You have chills.  You have severe shortness of breath.  You have bloody thick spit (sputum).  You throw up (vomit) often.  You lose too much body fluid (dehydration).  You have a severe headache.  You faint. MAKE SURE YOU:   Understand these instructions.  Will watch your  condition.  Will get help right away if you are not doing well or get worse.   This information is not intended to replace advice given to you by your health care provider. Make sure you discuss any questions you have with your health care provider.   Document Released: 04/20/2008 Document Revised: 07/05/2013 Document Reviewed: 04/25/2013 Elsevier Interactive Patient Education 2016 Elsevier Inc[employer]   Sinusitis, Adult Sinusitis is redness, soreness, and puffiness (inflammation) of the air pockets in the bones of your face (sinuses). The redness, soreness, and puffiness can cause air and mucus to get trapped in your sinuses. This can allow germs to grow and cause an infection.  HOME CARE   Drink enough fluids to keep your pee (urine) clear or pale yellow.  Use a humidifier in your home.  Run a hot shower to create steam in the bathroom. Sit in the bathroom with the door closed. Breathe in the steam 3-4 times a day.  Put a warm, moist washcloth on your face 3-4 times a day, or as told by your doctor.  Use salt water sprays (saline sprays) to wet the thick fluid in your nose. This can help the sinuses drain.  Only take medicine as told by your doctor. GET HELP RIGHT AWAY IF:   Your pain gets worse.  You have very bad headaches.  You are sick to your stomach (nauseous).  You throw up (vomit).  You are very sleepy (drowsy) all the time.  Your face is puffy (swollen).  Your vision changes.  You have a  stiff neck.  You have trouble breathing. MAKE SURE YOU:   Understand these instructions.  Will watch your condition.  Will get help right away if you are not doing well or get worse.   This information is not intended to replace advice given to you by your health care provider. Make sure you discuss any questions you have with your health care provider.   Document Released: 04/20/2008 Document Revised: 11/23/2014 Document Reviewed: 06/07/2012 Elsevier Interactive Patient  Education [employer].

## 2015-11-13 NOTE — Telephone Encounter (Signed)
Patient sounded bad and had deep cough with fever. Appt scheduled for today

## 2015-11-13 NOTE — Telephone Encounter (Signed)
Patient is requesting a Z-Pak called into [facility] she states she in not any better, please advise?

## 2015-11-14 DIAGNOSIS — J019 Acute sinusitis, unspecified: Secondary | ICD-10-CM | POA: Insufficient documentation

## 2015-11-14 DIAGNOSIS — J209 Acute bronchitis, unspecified: Secondary | ICD-10-CM | POA: Insufficient documentation

## 2015-11-14 NOTE — Assessment & Plan Note (Signed)

## 2015-11-14 NOTE — Assessment & Plan Note (Signed)
Antibiotic in office, CXR today and 10 day antibiotic and decongestant course with  Cough suppressant Work excuse to return 11/19/2015

## 2015-11-14 NOTE — Assessment & Plan Note (Signed)
Septra prescribed and Rocephin administered

## 2015-11-14 NOTE — Assessment & Plan Note (Signed)
Controlled, no change in medication DASH diet and commitment to daily physical activity for a minimum of 30 minutes discussed and encouraged, as a part of hypertension management. The importance of attaining a healthy weight is also discussed.  BP/Weight 11/13/2015 07/03/2015 03/11/2015 06/12/2014 09/06/2013 05/31/2013 [id number]  Systolic BP [id number] [id number] [id number]  Diastolic BP 68 80 82 90 84 82 73  Wt. (Lbs) 166.04 171.4 177 198.4 193 189 -  BMI 28.49 29.41 30.37 34.04 33.11 32.43 -

## 2015-11-14 NOTE — Assessment & Plan Note (Signed)
Improved Patient re-educated about  the importance of commitment to a  minimum of 150 minutes of exercise per week.  The importance of healthy food choices with portion control discussed. Encouraged to start a food diary, count calories and to consider  joining a support group. Sample diet sheets offered. Goals set by the patient for the next several months.   Weight /BMI 11/13/2015 07/03/2015 03/11/2015  WEIGHT 166 lb 0.6 oz 171 lb 6.4 oz 177 lb  HEIGHT - 5\' 4"  5\' 4"   BMI 28.49 kg/m2 29.41 kg/m2 30.37 kg/m2    Current exercise per week 120 minutes.

## 2015-11-14 NOTE — Progress Notes (Signed)
Subjective:    Patient ID: Lydia Perez, female    DOB: [birth year], 59 y.o.   MRN: [id number]  HPI 1 week h/o worsening head and chest congestion, associated with fever and chills intermittently. Nasal drainage has thickened , and is yellowish green, and at times bloody. Sputum is thick and yellow. C/o bilateral ear pressure, denies hearing loss and sore throat. Increasing fatigue , poor appetitie and sleep disturbed by cough. No improvement with OTC medication. Positive sick exposure on the  job   Review of Systems See HPI  Denies chest pains, palpitations and leg swelling Denies abdominal pain, nausea, vomiting,diarrhea or constipation.   Denies dysuria, frequency, hesitancy or incontinence. Denies joint pain, swelling and limitation in mobility. Denies headaches, seizures, numbness, or tingling. Denies depression, anxiety or insomnia. Denies skin break down or rash.        Objective:   Physical Exam BP 112/68 mmHg  Pulse 80  Temp(Src) 100.4 F (38 C)  Resp 18  Ht 5\' 4"  (1.626 m)  Wt 166 lb 0.6 oz (75.315 kg)  BMI 28.49 kg/m2  SpO2 95%  Patient alert and oriented and in ill appearing  HEENT: No facial asymmetry, EOMI,   oropharynx pink and moist.  Neck supple no JVD, no mass. Bilateral maxillary sinus tenderness  Chest: Adequate iar entry , bilateral crackles, no wheezwes  CVS: S1, S2 no murmurs, no S3.Regular rate.  ABD: Soft non tender.   Ext: No edema  MS: Adequate ROM spine, shoulders, hips and knees.  Skin: Intact, no ulcerations or rash noted.  Psych: Good eye contact, normal affect. Memory intact not anxious or depressed appearing.  CNS: CN 2-12 intact, power,  normal throughout.no focal deficits noted.         Assessment & Plan:  Acute bronchitis Antibiotic in office, CXR today and 10 day antibiotic and decongestant course with  Cough suppressant Work excuse to return 11/19/2015  Acute sinusitis Septra prescribed and Rocephin  administered  Essential hypertension Controlled, no change in medication DASH diet and commitment to daily physical activity for a minimum of 30 minutes discussed and encouraged, as a part of hypertension management. The importance of attaining a healthy weight is also discussed.  BP/Weight 11/13/2015 07/03/2015 03/11/2015 06/12/2014 09/06/2013 05/31/2013 [id number]  Systolic BP [id number] 123456 [id number] XX123456 123456 [id number]  Diastolic BP 68 80 82 90 84 82 73  Wt. (Lbs) 166.04 171.4 177 198.4 193 189 -  BMI 28.49 29.41 30.37 34.04 33.11 32.43 -        Nicotine dependence Patient counseled for approximately 5 minutes regarding the health risks of ongoing nicotine use, specifically all types of cancer, heart disease, stroke and respiratory failure. The options available for help with cessation ,the behavioral changes to assist the process, and the option to either gradully reduce usage  Or abruptly stop.is also discussed. Pt is also encouraged to set specific goals in number of cigarettes used daily, as well as to set a quit date.  Number of cigarettes/cigars currently smoking daily: 10   Obesity (BMI 30.0-34.9) Improved Patient re-educated about  the importance of commitment to a  minimum of 150 minutes of exercise per week.  The importance of healthy food choices with portion control discussed. Encouraged to start a food diary, count calories and to consider  joining a support group. Sample diet sheets offered. Goals set by the patient for the next several months.   Weight /BMI 11/13/2015 07/03/2015 03/11/2015  WEIGHT 166 lb 0.6 oz 171  lb 6.4 oz 177 lb  HEIGHT - 5\' 4"  5\' 4"   BMI 28.49 kg/m2 29.41 kg/m2 30.37 kg/m2    Current exercise per week 120 minutes.

## 2015-11-15 ENCOUNTER — Ambulatory Visit (HOSPITAL_COMMUNITY)
Admission: RE | Admit: 2015-11-15 | Discharge: 2015-11-15 | Disposition: A | Discharge status: Home / Self Care | Payer: BLUE CROSS/BLUE SHIELD | Source: Ambulatory Visit | Attending: Family Medicine | Admitting: Family Medicine

## 2015-11-15 DIAGNOSIS — J209 Acute bronchitis, unspecified: Secondary | ICD-10-CM | POA: Insufficient documentation

## 2015-12-04 ENCOUNTER — Ambulatory Visit: Payer: BLUE CROSS/BLUE SHIELD | Admitting: Family Medicine

## 2016-01-27 ENCOUNTER — Encounter: Payer: BLUE CROSS/BLUE SHIELD | Admitting: Family Medicine

## 2016-02-25 ENCOUNTER — Ambulatory Visit (INDEPENDENT_AMBULATORY_CARE_PROVIDER_SITE_OTHER): Payer: BLUE CROSS/BLUE SHIELD | Admitting: Family Medicine

## 2016-02-25 ENCOUNTER — Encounter: Payer: Self-pay | Admitting: Family Medicine

## 2016-02-25 ENCOUNTER — Ambulatory Visit (HOSPITAL_COMMUNITY)
Admission: RE | Admit: 2016-02-25 | Discharge: 2016-02-25 | Disposition: A | Discharge status: Home / Self Care | Payer: BLUE CROSS/BLUE SHIELD | Source: Ambulatory Visit | Attending: Family Medicine | Admitting: Family Medicine

## 2016-02-25 VITALS — BP 110/70 | HR 60 | Temp 99.6°F | Resp 18 | Ht 64.0 in | Wt 160.0 lb

## 2016-02-25 DIAGNOSIS — K529 Noninfective gastroenteritis and colitis, unspecified: Secondary | ICD-10-CM

## 2016-02-25 DIAGNOSIS — R918 Other nonspecific abnormal finding of lung field: Secondary | ICD-10-CM | POA: Diagnosis not present

## 2016-02-25 DIAGNOSIS — R05 Cough: Secondary | ICD-10-CM | POA: Diagnosis not present

## 2016-02-25 DIAGNOSIS — F32A Depression, unspecified: Secondary | ICD-10-CM

## 2016-02-25 DIAGNOSIS — R059 Cough, unspecified: Secondary | ICD-10-CM

## 2016-02-25 DIAGNOSIS — J209 Acute bronchitis, unspecified: Secondary | ICD-10-CM | POA: Diagnosis not present

## 2016-02-25 DIAGNOSIS — F329 Major depressive disorder, single episode, unspecified: Secondary | ICD-10-CM

## 2016-02-25 DIAGNOSIS — J9811 Atelectasis: Secondary | ICD-10-CM | POA: Insufficient documentation

## 2016-02-25 DIAGNOSIS — L309 Dermatitis, unspecified: Secondary | ICD-10-CM | POA: Diagnosis not present

## 2016-02-25 DIAGNOSIS — R079 Chest pain, unspecified: Secondary | ICD-10-CM | POA: Insufficient documentation

## 2016-02-25 DIAGNOSIS — I10 Essential (primary) hypertension: Secondary | ICD-10-CM

## 2016-02-25 MED ORDER — ONDANSETRON 4 MG PO TBDP: 4.0000 mg | ORAL_TABLET | Freq: Three times a day (TID) | ORAL | Status: DC | PRN

## 2016-02-25 MED ORDER — CLOTRIMAZOLE-BETAMETHASONE 1-0.05 % EX CREA: TOPICAL_CREAM | CUTANEOUS | Status: DC

## 2016-02-25 MED ORDER — DIPHENOXYLATE-ATROPINE 2.5-0.025 MG PO TABS: 1.0000 | ORAL_TABLET | Freq: Four times a day (QID) | ORAL | Status: DC | PRN

## 2016-02-25 MED ORDER — BENZONATATE 100 MG PO CAPS: 100.0000 mg | ORAL_CAPSULE | Freq: Two times a day (BID) | ORAL | Status: DC | PRN

## 2016-02-25 MED ORDER — PENICILLIN V POTASSIUM 500 MG PO TABS: 500.0000 mg | ORAL_TABLET | Freq: Three times a day (TID) | ORAL | Status: DC

## 2016-02-25 MED ORDER — ONDANSETRON HCL 4 MG/2ML IJ SOLN
4.0000 mg | Freq: Once | INTRAMUSCULAR | Status: AC
  Administered 2016-02-25: 4 mg via INTRAMUSCULAR

## 2016-02-25 NOTE — Progress Notes (Signed)
   Subjective:    Patient ID: Lydia Perez, female    DOB: December 14, 1955, 60 y.o.   MRN: [id number]  HPI  2 episodes of vomit and diarrhea yellow , and with  Mucus since past 12 hrs, nausea, generalized body aches , chills , and fever up to 103. Weak , no energy Cramping abdominal pain Cough and chest congestion worsening in past 5 days, denies sinus pressure or sore throat,  No nasal drainage or ear pain. Reports a lot of sick exposure at work   Review of Systems See HPI  Denies chest pains, palpitations and leg swelling  Denies dysuria, frequency, hesitancy or incontinence. Denies joint pain, swelling and limitation in mobility. Denies headaches, seizures, numbness, or tingling. Denies depression, anxiety or insomnia. Denies skin break down or rash.        Objective:   Physical Exam  BP 110/70 mmHg  Pulse 60  Temp(Src) 99.6 F (37.6 C)  Resp 18  Ht 5\' 4"  (1.626 m)  Wt 160 lb (72.576 kg)  BMI 27.45 kg/m2  SpO2 96% Patient alert and oriented and in no cardiopulmonary distress.Ill appearing HEENT: No facial asymmetry, EOMI,   oropharynx pink and moist.  Neck supple no JVD, no mass. No sinus tenderness, TM clear Chest: decreased air entry, bronchial breath sounds in bases with crackles  CVS: S1, S2 no murmurs, no S3.Regular rate.  ABD: Soft diffuse superficial tenderness, hyperactive BS, no guarding or rebound Ext: No edema  MS: Adequate ROM spine, shoulders, hips and knees.  Skin: Intact, no ulcerations or rash noted.  Psych: Good eye contact, normal affect. Memory intact not anxious or depressed appearing.  CNS: CN 2-12 intact, power,  normal throughout.no focal deficits noted.       Assessment & Plan:  Acute gastroenteritis Symptomatic treatment, education , and zofran IM in office  Acute bronchitis Decongestant and antibiotic, CXR and labs, work excuse also  Essential hypertension Controlled, no change in medication DASH diet and commitment to  daily physical activity for a minimum of 30 minutes discussed and encouraged, as a part of hypertension management. The importance of attaining a healthy weight is also discussed.  BP/Weight 02/25/2016 11/13/2015 07/03/2015 03/11/2015 06/12/2014 09/06/2013 [id number]  Systolic BP [id number] 123456 [id number] [id number] 123456 [id number]  Diastolic BP 70 68 80 82 90 84 82  Wt. (Lbs) 160 166.04 171.4 177 198.4 193 189  BMI 27.45 28.49 29.41 30.37 34.04 33.11 32.43        Chronic eczema Acute flare in periumbilical region, avoidance of nickel products to area, and topical medication  Depression Controlled, no change in medication

## 2016-02-25 NOTE — Patient Instructions (Addendum)
Annual physicL EXAM jULY 17 OR AFTER, CALL IF YOU NEED ME SOONER  YOU ARE TREATED FOR ACUTE GASTROENTERITIS  aCUTE BRONCHITIS  SKIN RASH FROM NICKEL ALLERGY ON WAISTLINE AND ALSO FUNGAL RASH IN AREA NO belts OR BUCKLES please  LABS AND CXR TODAY  WORK EXCUSE FROM TODAY TO RETURN ON 4/17, IF MUCH IMPROVED AND DEPENDING ON TEST RESULTS, CALL IN 2 DAYS AS YOU MAY BE ABLE TO RETURN THIS Friday  HAND WASHING AND COVERING YOUR COUGH WILL HELP TO REDUCE SPREAD OF INFECTION  FLUIDS , REST , AND MEDICATION AS PRESCRIBED   Food Choices to Help Relieve Diarrhea, Adult When you have diarrhea, the foods you eat and your eating habits are very important. Choosing the right foods and drinks can help relieve diarrhea. Also, because diarrhea can last up to 7 days, you need to replace lost fluids and electrolytes (such as sodium, potassium, and chloride) in order to help prevent dehydration.  WHAT GENERAL GUIDELINES DO I NEED TO FOLLOW?  Slowly drink 1 cup (8 oz) of fluid for each episode of diarrhea. If you are getting enough fluid, your urine will be clear or pale yellow.  Eat starchy foods. Some good choices include white rice, white toast, pasta, low-fiber cereal, baked potatoes (without the skin), saltine crackers, and bagels.  Avoid large servings of any cooked vegetables.  Limit fruit to two servings per day. A serving is  cup or 1 small piece.  Choose foods with less than 2 g of fiber per serving.  Limit fats to less than 8 tsp (38 g) per day.  Avoid fried foods.  Eat foods that have probiotics in them. Probiotics can be found in certain dairy products.  Avoid foods and beverages that may increase the speed at which food moves through the stomach and intestines (gastrointestinal tract). Things to avoid include:  High-fiber foods, such as dried fruit, raw fruits and vegetables, nuts, seeds, and whole grain foods.  Spicy foods and high-fat foods.  Foods and beverages sweetened with  high-fructose corn syrup, honey, or sugar alcohols such as xylitol, sorbitol, and mannitol. WHAT FOODS ARE RECOMMENDED? Grains White rice. White, [place], or pita breads (fresh or toasted), including plain rolls, buns, or bagels. White pasta. Saltine, soda, or graham crackers. Pretzels. Low-fiber cereal. Cooked cereals made with water (such as cornmeal, farina, or cream cereals). Plain muffins. Matzo. Melba toast. Zwieback.  Vegetables Potatoes (without the skin). Strained tomato and vegetable juices. Most well-cooked and canned vegetables without seeds. Tender lettuce. Fruits Cooked or canned applesauce, apricots, cherries, fruit cocktail, grapefruit, peaches, pears, or plums. Fresh bananas, apples without skin, cherries, grapes, cantaloupe, grapefruit, peaches, oranges, or plums.  Meat and Other Protein Products Baked or boiled chicken. Eggs. Tofu. Fish. Seafood. Smooth peanut butter. Ground or well-cooked tender beef, ham, veal, lamb, pork, or poultry.  Dairy Plain yogurt, kefir, and unsweetened liquid yogurt. Lactose-free milk, buttermilk, or soy milk. Plain hard cheese. Beverages Sport drinks. Clear broths. Diluted fruit juices (except prune). Regular, caffeine-free sodas such as ginger ale. Water. Decaffeinated teas. Oral rehydration solutions. Sugar-free beverages not sweetened with sugar alcohols. Other Bouillon, broth, or soups made from recommended foods.  The items listed above may not be a complete list of recommended foods or beverages. Contact your dietitian for more options. WHAT FOODS ARE NOT RECOMMENDED? Grains Whole grain, whole wheat, bran, or rye breads, rolls, pastas, crackers, and cereals. Wild or brown rice. Cereals that contain more than 2 g of fiber per serving. Corn tortillas  or taco shells. Cooked or dry oatmeal. Granola. Popcorn. Vegetables Raw vegetables. Cabbage, broccoli, Brussels sprouts, artichokes, baked beans, beet greens, corn, kale, legumes, peas, sweet  potatoes, and yams. Potato skins. Cooked spinach and cabbage. Fruits Dried fruit, including raisins and dates. Raw fruits. Stewed or dried prunes. Fresh apples with skin, apricots, mangoes, pears, raspberries, and strawberries.  Meat and Other Protein Products Chunky peanut butter. Nuts and seeds. Beans and lentils. Berniece Salines.  Dairy High-fat cheeses. Milk, chocolate milk, and beverages made with milk, such as milk shakes. Cream. Ice cream. Sweets and Desserts Sweet rolls, doughnuts, and sweet breads. Pancakes and waffles. Fats and Oils Butter. Cream sauces. Margarine. Salad oils. Plain salad dressings. Olives. Avocados.  Beverages Caffeinated beverages (such as coffee, tea, soda, or energy drinks). Alcoholic beverages. Fruit juices with pulp. Prune juice. Soft drinks sweetened with high-fructose corn syrup or sugar alcohols. Other Coconut. Hot sauce. Chili powder. Mayonnaise. Gravy. Cream-based or milk-based soups.  The items listed above may not be a complete list of foods and beverages to avoid. Contact your dietitian for more information. WHAT SHOULD I DO IF I BECOME DEHYDRATED? Diarrhea can sometimes lead to dehydration. Signs of dehydration include dark urine and dry mouth and skin. If you think you are dehydrated, you should rehydrate with an oral rehydration solution. These solutions can be purchased at pharmacies, retail stores, or online.  Drink -1 cup (120-240 mL) of oral rehydration solution each time you have an episode of diarrhea. If drinking this amount makes your diarrhea worse, try drinking smaller amounts more often. For example, drink 1-3 tsp (5-15 mL) every 5-10 minutes.  A general rule for staying hydrated is to drink 1-2 L of fluid per day. Talk to your health care provider about the specific amount you should be drinking each day. Drink enough fluids to keep your urine clear or pale yellow.   This information is not intended to replace advice given to you by your health  care provider. Make sure you discuss any questions you have with your health care provider.   Document Released: 01/23/2004 Document Revised: 11/23/2014 Document Reviewed: 09/25/2013 Elsevier Interactive Patient Education [employer].

## 2016-02-26 ENCOUNTER — Other Ambulatory Visit: Payer: Self-pay

## 2016-02-26 DIAGNOSIS — J209 Acute bronchitis, unspecified: Secondary | ICD-10-CM

## 2016-02-26 LAB — COMPREHENSIVE METABOLIC PANEL
ALK PHOS: 67 U/L (ref 33–130)
ALT: 18 U/L (ref 6–29)
AST: 29 U/L (ref 10–35)
Albumin: 3.6 g/dL (ref 3.6–5.1)
BILIRUBIN TOTAL: 0.4 mg/dL (ref 0.2–1.2)
BUN: 15 mg/dL (ref 7–25)
CALCIUM: 8.5 mg/dL — AB (ref 8.6–10.4)
CO2: 25 mmol/L (ref 20–31)
CREATININE: 1.1 mg/dL — AB (ref 0.50–1.05)
Chloride: 106 mmol/L (ref 98–110)
GLUCOSE: 94 mg/dL (ref 65–99)
Potassium: 3.7 mmol/L (ref 3.5–5.3)
SODIUM: 137 mmol/L (ref 135–146)
Total Protein: 7 g/dL (ref 6.1–8.1)

## 2016-02-26 LAB — CBC WITH DIFFERENTIAL/PLATELET
BASOS PCT: 1 %
Basophils Absolute: 20 cells/uL (ref 0–200)
EOS PCT: 0 %
Eosinophils Absolute: 0 cells/uL — ABNORMAL LOW (ref 15–500)
HEMATOCRIT: 41.7 % (ref 35.0–45.0)
HEMOGLOBIN: 13.9 g/dL (ref 11.7–15.5)
LYMPHS ABS: 560 {cells}/uL — AB (ref 850–3900)
LYMPHS PCT: 28 %
MCH: 31 pg (ref 27.0–33.0)
MCHC: 33.3 g/dL (ref 32.0–36.0)
MCV: 92.9 fL (ref 80.0–100.0)
MONO ABS: 480 {cells}/uL (ref 200–950)
MPV: 10.7 fL (ref 7.5–12.5)
Monocytes Relative: 24 %
NEUTROS PCT: 47 %
Neutro Abs: 940 cells/uL — ABNORMAL LOW (ref 1500–7800)
Platelets: 163 10*3/uL (ref 140–400)
RBC: 4.49 MIL/uL (ref 3.80–5.10)
RDW: 13.6 % (ref 11.0–15.0)
WBC: 2 10*3/uL — AB (ref 3.8–10.8)

## 2016-02-27 ENCOUNTER — Encounter: Payer: Self-pay | Admitting: Family Medicine

## 2016-02-29 ENCOUNTER — Encounter: Payer: Self-pay | Admitting: Family Medicine

## 2016-02-29 NOTE — Assessment & Plan Note (Signed)
Decongestant and antibiotic, CXR and labs, work excuse also

## 2016-02-29 NOTE — Assessment & Plan Note (Signed)
Controlled, no change in medication  

## 2016-02-29 NOTE — Assessment & Plan Note (Signed)
Controlled, no change in medication DASH diet and commitment to daily physical activity for a minimum of 30 minutes discussed and encouraged, as a part of hypertension management. The importance of attaining a healthy weight is also discussed.  BP/Weight 02/25/2016 11/13/2015 07/03/2015 03/11/2015 06/12/2014 09/06/2013 [id number]  Systolic BP A999333 [id number] 123456 [id number] 123456 [id number]  Diastolic BP 70 68 80 82 90 84 82  Wt. (Lbs) 160 166.04 171.4 177 198.4 193 189  BMI 27.45 28.49 29.41 30.37 34.04 33.11 32.43

## 2016-02-29 NOTE — Assessment & Plan Note (Signed)
Acute flare in periumbilical region, avoidance of nickel products to area, and topical medication

## 2016-02-29 NOTE — Assessment & Plan Note (Signed)
Symptomatic treatment, education , and zofran IM in office

## 2016-03-04 ENCOUNTER — Other Ambulatory Visit: Payer: Self-pay | Admitting: Family Medicine

## 2016-03-04 DIAGNOSIS — Z0289 Encounter for other administrative examinations: Secondary | ICD-10-CM

## 2016-03-06 NOTE — Addendum Note (Signed)
Addended by: Eual Fines on: 03/06/2016 08:46 AM   Modules accepted: Orders

## 2016-03-26 ENCOUNTER — Ambulatory Visit (HOSPITAL_COMMUNITY)
Admission: RE | Admit: 2016-03-26 | Discharge: 2016-03-26 | Disposition: A | Discharge status: Home / Self Care | Payer: BLUE CROSS/BLUE SHIELD | Source: Ambulatory Visit | Attending: Family Medicine | Admitting: Family Medicine

## 2016-03-26 DIAGNOSIS — J209 Acute bronchitis, unspecified: Secondary | ICD-10-CM

## 2016-03-26 LAB — CBC WITH DIFFERENTIAL/PLATELET
BASOS PCT: 0 %
Basophils Absolute: 0 cells/uL (ref 0–200)
EOS ABS: 153 {cells}/uL (ref 15–500)
Eosinophils Relative: 3 %
HEMATOCRIT: 39.7 % (ref 35.0–45.0)
HEMOGLOBIN: 12.9 g/dL (ref 11.7–15.5)
LYMPHS PCT: 38 %
Lymphs Abs: 1938 cells/uL (ref 850–3900)
MCH: 30.6 pg (ref 27.0–33.0)
MCHC: 32.5 g/dL (ref 32.0–36.0)
MCV: 94.1 fL (ref 80.0–100.0)
MONO ABS: 561 {cells}/uL (ref 200–950)
MPV: 11 fL (ref 7.5–12.5)
Monocytes Relative: 11 %
Neutro Abs: 2448 cells/uL (ref 1500–7800)
Neutrophils Relative %: 48 %
Platelets: 208 10*3/uL (ref 140–400)
RBC: 4.22 MIL/uL (ref 3.80–5.10)
RDW: 13.8 % (ref 11.0–15.0)
WBC: 5.1 10*3/uL (ref 3.8–10.8)

## 2016-05-28 ENCOUNTER — Ambulatory Visit (INDEPENDENT_AMBULATORY_CARE_PROVIDER_SITE_OTHER): Payer: BLUE CROSS/BLUE SHIELD | Admitting: Family Medicine

## 2016-05-28 ENCOUNTER — Ambulatory Visit (HOSPITAL_COMMUNITY)
Admission: RE | Admit: 2016-05-28 | Discharge: 2016-05-28 | Disposition: A | Discharge status: Home / Self Care | Payer: BLUE CROSS/BLUE SHIELD | Source: Ambulatory Visit | Attending: Family Medicine | Admitting: Family Medicine

## 2016-05-28 ENCOUNTER — Encounter: Payer: Self-pay | Admitting: Family Medicine

## 2016-05-28 VITALS — BP 128/74 | HR 66 | Resp 18 | Ht 64.0 in | Wt 165.0 lb

## 2016-05-28 DIAGNOSIS — E669 Obesity, unspecified: Secondary | ICD-10-CM

## 2016-05-28 DIAGNOSIS — E559 Vitamin D deficiency, unspecified: Secondary | ICD-10-CM

## 2016-05-28 DIAGNOSIS — M25571 Pain in right ankle and joints of right foot: Secondary | ICD-10-CM | POA: Diagnosis present

## 2016-05-28 DIAGNOSIS — R7303 Prediabetes: Secondary | ICD-10-CM

## 2016-05-28 DIAGNOSIS — M25471 Effusion, right ankle: Secondary | ICD-10-CM | POA: Insufficient documentation

## 2016-05-28 DIAGNOSIS — I10 Essential (primary) hypertension: Secondary | ICD-10-CM | POA: Diagnosis not present

## 2016-05-28 DIAGNOSIS — F17208 Nicotine dependence, unspecified, with other nicotine-induced disorders: Secondary | ICD-10-CM

## 2016-05-28 DIAGNOSIS — R937 Abnormal findings on diagnostic imaging of other parts of musculoskeletal system: Secondary | ICD-10-CM | POA: Diagnosis not present

## 2016-05-28 DIAGNOSIS — E785 Hyperlipidemia, unspecified: Secondary | ICD-10-CM

## 2016-05-28 DIAGNOSIS — M25579 Pain in unspecified ankle and joints of unspecified foot: Secondary | ICD-10-CM | POA: Insufficient documentation

## 2016-05-28 MED ORDER — IBUPROFEN 800 MG PO TABS: ORAL_TABLET | ORAL | Status: AC

## 2016-05-28 MED ORDER — GABAPENTIN 300 MG PO CAPS: 300.0000 mg | ORAL_CAPSULE | Freq: Three times a day (TID) | ORAL | Status: DC

## 2016-05-28 MED ORDER — RANITIDINE HCL 300 MG PO TABS: 300.0000 mg | ORAL_TABLET | Freq: Every day | ORAL | Status: DC

## 2016-05-28 NOTE — Assessment & Plan Note (Signed)

## 2016-05-28 NOTE — Assessment & Plan Note (Signed)
Controlled, no change in medication DASH diet and commitment to daily physical activity for a minimum of 30 minutes discussed and encouraged, as a part of hypertension management. The importance of attaining a healthy weight is also discussed.  BP/Weight 05/28/2016 02/25/2016 11/13/2015 07/03/2015 03/11/2015 06/12/2014 [id number]  Systolic BP 0000000 [id number] 123456 [id number] 123456  Diastolic BP 74 70 68 80 82 90 84  Wt. (Lbs) 165 160 166.04 171.4 177 198.4 193  BMI 28.31 27.45 28.49 29.41 30.37 34.04 33.[place]

## 2016-05-28 NOTE — Assessment & Plan Note (Signed)
Pain , swelling and deformity 5 days post injury , no improvement, exam suggestive of severe sprain or fracture. Anti inflammatory course , with gabapentin for pain, x ray of ankle and urgent ortho referral, out of work till ortho eval X ray confirmed possible avulsion fracture, pt advised to stay off the foot and to wear a hard shoe when ambulating

## 2016-05-28 NOTE — Patient Instructions (Addendum)
Annual physical exam in 3.5 month,  Call if you need me sooner  Fasting lipid, cmp , TSH and vit D in 3.5 month  Ibuprofen, gabapentin and zantac sent fopr acute ankl;e pain and urgent referral to orthopedics  Work excuse until seen by orthopedic Doc, pls get appt info before you leave today  Work on smoking cessation for improved health

## 2016-05-28 NOTE — Progress Notes (Signed)
Lydia Perez     MRN: [id number]      DOB: [birth year]   HPI Lydia Perez is here with a 5 day h/o swelling and pain of rigth ankle following accidemnt on elipticalmachine while in [place] 5 days ago, she accidentally slipped off the machine Otherwise she has been doing well ROS Denies recent fever or chills. Denies sinus pressure, nasal congestion, ear pain or sore throat. Denies chest congestion, productive cough or wheezing. Denies chest pains, palpitations and leg swelling Denies abdominal pain, nausea, vomiting,diarrhea or constipation.   Denies dysuria, frequency, hesitancy or incontinence. . Denies headaches, seizures, numbness, or tingling. Denies depression, anxiety or insomnia. Denies skin break down or rash.   PE  BP 128/74 mmHg  Pulse 66  Resp 18  Ht 5\' 4"  (1.626 m)  Wt 165 lb (74.844 kg)  BMI 28.31 kg/m2  SpO2 98%  Patient alert and oriented and in no cardiopulmonary distress.  HEENT: No facial asymmetry, EOMI,   oropharynx pink and moist.  Neck supple no JVD, no mass.  Chest: Clear to auscultation bilaterally.  CVS: S1, S2 no murmurs, no S3.Regular rate.  ABD: Soft non tender.   Ext: No edema  MS: Adequate ROM spine, shoulders, hips and knees.Decreased ROM right ankle which is swollen, warm , red and deformed, localized lateral point tenderness  Skin: Intact, no ulcerations or rash noted.  Psych: Good eye contact, normal affect. Memory intact not anxious or depressed appearing.  CNS: CN 2-12 intact, power,  normal throughout.no focal deficits noted.   Assessment & Plan  Acute ankle pain Pain , swelling and deformity 5 days post injury , no improvement, exam suggestive of severe sprain or fracture. Anti inflammatory course , with gabapentin for pain, x ray of ankle and urgent ortho referral, out of work till ortho eval X ray confirmed possible avulsion fracture, pt advised to stay off the foot and to wear a hard shoe when  ambulating  Nicotine dependence Patient counseled for approximately 5 minutes regarding the health risks of ongoing nicotine use, specifically all types of cancer, heart disease, stroke and respiratory failure. The options available for help with cessation ,the behavioral changes to assist the process, and the option to either gradully reduce usage  Or abruptly stop.is also discussed. Pt is also encouraged to set specific goals in number of cigarettes used daily, as well as to set a quit date.     Essential hypertension Controlled, no change in medication DASH diet and commitment to daily physical activity for a minimum of 30 minutes discussed and encouraged, as a part of hypertension management. The importance of attaining a healthy weight is also discussed.  BP/Weight 05/28/2016 02/25/2016 11/13/2015 07/03/2015 03/11/2015 06/12/2014 [id number]  Systolic BP 0000000 [id number] [id number] [id number] 123456  Diastolic BP 74 70 68 80 82 90 84  Wt. (Lbs) 165 160 166.04 171.4 177 198.4 193  BMI 28.31 27.45 28.49 29.41 30.37 34.04 33.11        Obesity (BMI 30.0-34.9) Unchanged Patient re-educated about  the importance of commitment to a  minimum of 150 minutes of exercise per week.  The importance of healthy food choices with portion control discussed. Encouraged to start a food diary, count calories and to consider  joining a support group. Sample diet sheets offered. Goals set by the patient for the next several months.   Weight /BMI 05/28/2016 02/25/2016 11/13/2015  WEIGHT 165 lb 160 lb 166 lb 0.6 oz  HEIGHT 5\' 4"  5\' 4"   5\' 4"   BMI 28.31 kg/m2 27.45 kg/m2 28.49 kg/m2

## 2016-05-28 NOTE — Assessment & Plan Note (Signed)
Unchanged Patient re-educated about  the importance of commitment to a  minimum of 150 minutes of exercise per week.  The importance of healthy food choices with portion control discussed. Encouraged to start a food diary, count calories and to consider  joining a support group. Sample diet sheets offered. Goals set by the patient for the next several months.   Weight /BMI 05/28/2016 02/25/2016 11/13/2015  WEIGHT 165 lb 160 lb 166 lb 0.6 oz  HEIGHT 5\' 4"  5\' 4"  5\' 4"   BMI 28.31 kg/m2 27.45 kg/m2 28.49 kg/m2

## 2016-06-02 ENCOUNTER — Ambulatory Visit (INDEPENDENT_AMBULATORY_CARE_PROVIDER_SITE_OTHER): Payer: BLUE CROSS/BLUE SHIELD | Admitting: Orthopaedic Surgery

## 2016-06-02 ENCOUNTER — Encounter: Payer: Self-pay | Admitting: Orthopaedic Surgery

## 2016-06-02 VITALS — BP 145/87 | HR 73 | Temp 97.5°F | Ht 65.5 in | Wt 165.6 lb

## 2016-06-02 DIAGNOSIS — S92101A Unspecified fracture of right talus, initial encounter for closed fracture: Secondary | ICD-10-CM

## 2016-06-02 NOTE — Progress Notes (Signed)
Subjective: I slipped off an elliptical bike and hurt my right ankle.    Patient ID: Lydia Perez, female    DOB: [birth year], 60 y.o.   MRN: [id number]  HPI She was doing exercises on an elliptical bicycle and her right foot slipped off and she hurt her right ankle on 05-23-16,  She had pain and swelling.  She went to the office of Dr. Moshe Cipro on 05-28-16.  X-rays showed a fracture of the dorsal neck of the right talus.  I have reviewed the notes, the x-rays and the x-ray report.  She continues to have pain of the right ankle and swelling.  She has no new trauma, no redness.   Review of Systems  HENT: Negative for congestion.   Respiratory: Negative for shortness of breath.   Cardiovascular: Negative for chest pain and leg swelling.  Endocrine: Positive for cold intolerance.  Musculoskeletal: Positive for joint swelling, arthralgias and gait problem.  Allergic/Immunologic: Positive for environmental allergies.   Past Medical History  Diagnosis Date  . Anemia     NOS   . Hiatal hernia   . Bipolar disorder ([place]) 2010    dx by PCP   . History of cardiac cath 2002  . Esophageal stricture     Past Surgical History  Procedure Laterality Date  . Total abdominal hysterectomy w/ bilateral salpingoophorectomy  01/2005  . Tubal ligation    . Esophagogastroduodenoscopy  07/19/2007    SLF: Normal  . Colonoscopy  08/13/2008    SLF: SIMPLE ADENOMA/HYPERPLASTIC POLYP  . Colonoscopy N/A 05/15/2013    SLF:SIX COLORECTALPOLYPS REMOVED./RECTAL BLEEDING DUE TO Moderate sized internal hemorrhoids  . Hemorrhoid banding N/A 05/15/2013    Procedure: HEMORRHOID BANDING;  Surgeon: Danie Binder, MD;  Location: [facility];  Service: Endoscopy;  Laterality: N/A;  . Esophagogastroduodenoscopy (egd) with esophageal dilation N/A 05/15/2013    [id number] Non-erosive gastritis    Current Outpatient Prescriptions on File Prior to Visit  Medication Sig Dispense Refill  . amLODipine (NORVASC) 5 MG tablet  Take 1 tablet (5 mg total) by mouth daily. 30 tablet 6  . gabapentin (NEURONTIN) 300 MG capsule Take 1 capsule (300 mg total) by mouth 3 (three) times daily. 90 capsule 3  . ibuprofen (ADVIL,MOTRIN) 800 MG tablet One tablet twice daily for pain and swelling of right ankle 20 tablet 0  . QUEtiapine Fumarate (SEROQUEL XR) 150 MG 24 hr tablet Take 1 tablet (150 mg total) by mouth at bedtime. 30 tablet 2  . ranitidine (ZANTAC) 300 MG tablet Take 1 tablet (300 mg total) by mouth at bedtime. 30 tablet 0   No current facility-administered medications on file prior to visit.    Social History   Social History  . Marital Status: Married    Spouse Name: N/A  . Number of Children: 3  . Years of Education: N/A   Occupational History  . Engineer, manufacturing    Social History Main Topics  . Smoking status: Current Every Day Smoker -- 0.25 packs/day for 25 years    Types: Cigarettes  . Smokeless tobacco: Never Used     Comment: current is 5 per day in 06/2015, down form 1 PPD in 11/2014  . Alcohol Use: No  . Drug Use: No     Comment: marijuana   . Sexual Activity: Yes    Birth Control/ Protection: Surgical   Other Topics Concern  . Not on file   Social History Narrative    Family History  Problem Relation Age of Onset  . Emphysema Father     was a smoker  . Kidney disease Mother   . Heart disease Mother     1st by pass in her 77's   . Heart failure Mother   . Diabetes Mother   . Hypertension Mother   . Heart attack Brother   . Heart failure Brother   . Colon cancer Neg Hx   . Colon polyps Neg Hx   . Lung cancer Father     was a smoker    BP 145/87 mmHg  Pulse 73  Temp(Src) 97.5 F (36.4 C)  Ht 5' 5.5" (1.664 m)  Wt 165 lb 9.6 oz (75.116 kg)  BMI 27.13 kg/m2       Objective:   Physical Exam  Constitutional: She is oriented to person, place, and time. She appears well-developed and well-nourished.  HENT:  Head: Normocephalic and atraumatic.  Eyes: Conjunctivae and  EOM are normal. Pupils are equal, round, and reactive to light.  Neck: Normal range of motion. Neck supple.  Cardiovascular: Normal rate, regular rhythm and intact distal pulses.   Pulmonary/Chest: Effort normal.  Abdominal: Soft.  Musculoskeletal: She exhibits tenderness (Right dorsal foot with swelling and swelling of the right ankle but has full motion.  NV intact.  Limp to right.  Left foot/ankle negative.).  Neurological: She is alert and oriented to person, place, and time. She displays normal reflexes. No cranial nerve deficit. She exhibits normal muscle tone. Coordination normal.  Skin: Skin is warm and dry.  Psychiatric: She has a normal mood and affect. Her behavior is normal. Judgment and thought content normal.          Assessment & Plan:   Encounter Diagnosis  Name Primary?  . Fracture, talus, right, closed, initial encounter Yes   She was fitted with a CAM walker.  Return in one week.  X-rays on return.  Call if any problem.  Instructions for contrast baths given.  Precautions discussed.  She has pain medicine from Dr. Moshe Cipro.  Electronically Signed Sanjuana Kava, MD 7/18/20179:30 AM

## 2016-06-09 ENCOUNTER — Encounter: Payer: Self-pay | Admitting: Orthopaedic Surgery

## 2016-06-09 ENCOUNTER — Ambulatory Visit (HOSPITAL_COMMUNITY)
Admission: RE | Admit: 2016-06-09 | Discharge: 2016-06-09 | Disposition: A | Discharge status: Home / Self Care | Payer: BLUE CROSS/BLUE SHIELD | Source: Ambulatory Visit | Attending: Orthopaedic Surgery | Admitting: Orthopaedic Surgery

## 2016-06-09 ENCOUNTER — Ambulatory Visit: Payer: BLUE CROSS/BLUE SHIELD | Admitting: Orthopaedic Surgery

## 2016-06-09 VITALS — BP 141/87 | HR 84 | Ht 65.5 in | Wt 168.0 lb

## 2016-06-09 DIAGNOSIS — S92101D Unspecified fracture of right talus, subsequent encounter for fracture with routine healing: Secondary | ICD-10-CM

## 2016-06-09 DIAGNOSIS — M7731 Calcaneal spur, right foot: Secondary | ICD-10-CM | POA: Insufficient documentation

## 2016-06-09 DIAGNOSIS — X58XXXD Exposure to other specified factors, subsequent encounter: Secondary | ICD-10-CM | POA: Insufficient documentation

## 2016-06-09 NOTE — Progress Notes (Signed)
CC:  My ankle is still sore  X-rays show the nondisplaced dorsal talus fracture avulsion on the right.  She has swelling still dorsally and lateral foot.  NV is intact.  ROM is full  She is using the CAM walker.  Encounter Diagnosis  Name Primary?  . Fracture of talus of right ankle, closed, with routine healing, subsequent encounter Yes   Continue soaks and CAM walker.  Return in two weeks.  X-rays then.  Call if any problem.  Electronically Signed Sanjuana Kava, MD 7/25/201710:26 AM

## 2016-06-16 ENCOUNTER — Encounter: Payer: Self-pay | Admitting: Family Medicine

## 2016-06-16 ENCOUNTER — Encounter: Payer: BLUE CROSS/BLUE SHIELD | Admitting: Family Medicine

## 2016-06-23 ENCOUNTER — Ambulatory Visit: Payer: BLUE CROSS/BLUE SHIELD | Admitting: Orthopaedic Surgery

## 2016-06-23 ENCOUNTER — Encounter: Payer: Self-pay | Admitting: Orthopaedic Surgery

## 2016-06-23 ENCOUNTER — Ambulatory Visit (INDEPENDENT_AMBULATORY_CARE_PROVIDER_SITE_OTHER): Payer: BLUE CROSS/BLUE SHIELD

## 2016-06-23 ENCOUNTER — Encounter: Payer: Self-pay | Admitting: Orthopedic Surgery

## 2016-06-23 VITALS — BP 132/89 | HR 72 | Temp 97.9°F | Ht 65.5 in | Wt 170.0 lb

## 2016-06-23 DIAGNOSIS — S92101D Unspecified fracture of right talus, subsequent encounter for fracture with routine healing: Secondary | ICD-10-CM

## 2016-06-23 NOTE — Progress Notes (Signed)
CC:  I am better but it still hurts at times  Her right foot is doing well. She has been using the CAM walker.  She has tried walking without the CAM walker at times and has some pain.  I told her to continue it.  She has no new trauma.  NV intact. ROM of right ankle full. Tender dorsal of the right foot proximal.  Encounter Diagnosis  Name Primary?  . Fracture of talus of right ankle, closed, with routine healing, subsequent encounter Yes   Return in three weeks.  X-rays of the right foot on return.  Call if any problem.  Continue CAM walker.  Electronically Signed Sanjuana Kava, MD 8/8/201710:15 AM

## 2016-07-14 ENCOUNTER — Ambulatory Visit (INDEPENDENT_AMBULATORY_CARE_PROVIDER_SITE_OTHER): Payer: BLUE CROSS/BLUE SHIELD

## 2016-07-14 ENCOUNTER — Ambulatory Visit (INDEPENDENT_AMBULATORY_CARE_PROVIDER_SITE_OTHER): Payer: BLUE CROSS/BLUE SHIELD | Admitting: Orthopaedic Surgery

## 2016-07-14 ENCOUNTER — Encounter: Payer: Self-pay | Admitting: Orthopaedic Surgery

## 2016-07-14 VITALS — BP 127/83 | HR 64 | Temp 97.5°F | Ht 65.5 in | Wt 175.0 lb

## 2016-07-14 DIAGNOSIS — S82891A Other fracture of right lower leg, initial encounter for closed fracture: Secondary | ICD-10-CM

## 2016-07-14 DIAGNOSIS — Z720 Tobacco use: Secondary | ICD-10-CM | POA: Diagnosis not present

## 2016-07-14 DIAGNOSIS — F17200 Nicotine dependence, unspecified, uncomplicated: Secondary | ICD-10-CM

## 2016-07-14 DIAGNOSIS — S92101D Unspecified fracture of right talus, subsequent encounter for fracture with routine healing: Secondary | ICD-10-CM

## 2016-07-14 NOTE — Progress Notes (Signed)
CC:  My ankle does not hurt  She is walking well, no pain, no problem.  NV intact.  Full ROM is present.  She is willing to cut back on smoking.  Encounter Diagnoses  Name Primary?  Marland Kitchen Ankle fracture, right Yes  . Fracture of talus of right ankle, closed, with routine healing, subsequent encounter   . Tobacco smoker within last 12 months     I will see PRN.  Electronically Signed Sanjuana Kava, MD 8/29/201710:06 AM

## 2016-07-14 NOTE — Patient Instructions (Signed)

## 2016-10-21 ENCOUNTER — Encounter: Payer: BLUE CROSS/BLUE SHIELD | Admitting: Family Medicine

## 2016-12-01 ENCOUNTER — Telehealth: Payer: Self-pay | Admitting: Family Medicine

## 2016-12-01 ENCOUNTER — Other Ambulatory Visit: Payer: Self-pay

## 2016-12-01 DIAGNOSIS — I10 Essential (primary) hypertension: Secondary | ICD-10-CM

## 2016-12-01 MED ORDER — AMLODIPINE BESYLATE 5 MG PO TABS: 5.0000 mg | ORAL_TABLET | Freq: Every day | ORAL | 0 refills | Status: DC

## 2016-12-01 NOTE — Telephone Encounter (Signed)
Med refilled.  Letter mailed for patient to schedule appointment

## 2016-12-01 NOTE — Telephone Encounter (Signed)
Lydia Perez is asking for a refill on her amLODipine (NORVASC) 5 MG tablet sent to [facility]

## 2017-02-11 ENCOUNTER — Other Ambulatory Visit: Payer: Self-pay | Admitting: Family Medicine

## 2017-02-11 DIAGNOSIS — Z1231 Encounter for screening mammogram for malignant neoplasm of breast: Secondary | ICD-10-CM

## 2017-02-15 ENCOUNTER — Ambulatory Visit (HOSPITAL_COMMUNITY)
Admission: RE | Admit: 2017-02-15 | Discharge: 2017-02-15 | Disposition: A | Discharge status: Home / Self Care | Payer: BLUE CROSS/BLUE SHIELD | Source: Ambulatory Visit | Attending: Family Medicine | Admitting: Family Medicine

## 2017-02-15 ENCOUNTER — Other Ambulatory Visit: Payer: Self-pay

## 2017-02-15 ENCOUNTER — Telehealth: Payer: Self-pay

## 2017-02-15 DIAGNOSIS — Z1231 Encounter for screening mammogram for malignant neoplasm of breast: Secondary | ICD-10-CM | POA: Insufficient documentation

## 2017-02-15 DIAGNOSIS — E559 Vitamin D deficiency, unspecified: Secondary | ICD-10-CM

## 2017-02-15 DIAGNOSIS — I10 Essential (primary) hypertension: Secondary | ICD-10-CM

## 2017-02-15 DIAGNOSIS — R7303 Prediabetes: Secondary | ICD-10-CM

## 2017-02-15 DIAGNOSIS — D750 Familial erythrocytosis: Secondary | ICD-10-CM

## 2017-02-15 NOTE — Telephone Encounter (Signed)
error 

## 2017-02-16 LAB — COMPLETE METABOLIC PANEL WITH GFR
ALBUMIN: 3.6 g/dL (ref 3.6–5.1)
ALK PHOS: 53 U/L (ref 33–130)
ALT: 10 U/L (ref 6–29)
AST: 16 U/L (ref 10–35)
BILIRUBIN TOTAL: 0.4 mg/dL (ref 0.2–1.2)
BUN: 12 mg/dL (ref 7–25)
CALCIUM: 8.9 mg/dL (ref 8.6–10.4)
CO2: 28 mmol/L (ref 20–31)
Chloride: 108 mmol/L (ref 98–110)
Creat: 0.94 mg/dL (ref 0.50–1.05)
GFR, EST AFRICAN AMERICAN: 77 mL/min (ref >=60)
GFR, EST NON AFRICAN AMERICAN: 67 mL/min (ref >=60)
Glucose, Bld: 100 mg/dL — ABNORMAL HIGH (ref 65–99)
POTASSIUM: 4 mmol/L (ref 3.5–5.3)
SODIUM: 143 mmol/L (ref 135–146)
TOTAL PROTEIN: 6.7 g/dL (ref 6.1–8.1)

## 2017-02-16 LAB — LIPID PANEL
CHOL/HDL RATIO: 3.3 ratio (ref <5.0)
CHOLESTEROL: 164 mg/dL (ref <200)
HDL: 49 mg/dL — AB (ref >50)
LDL Cholesterol: 105 mg/dL — ABNORMAL HIGH (ref <100)
Triglycerides: 50 mg/dL (ref <150)
VLDL: 10 mg/dL (ref <30)

## 2017-02-16 LAB — TSH: TSH: 1.86 mIU/L

## 2017-02-17 ENCOUNTER — Ambulatory Visit (INDEPENDENT_AMBULATORY_CARE_PROVIDER_SITE_OTHER): Payer: BLUE CROSS/BLUE SHIELD | Admitting: Family Medicine

## 2017-02-17 ENCOUNTER — Encounter: Payer: Self-pay | Admitting: Family Medicine

## 2017-02-17 VITALS — BP 138/80 | HR 60 | Temp 96.5°F | Resp 16 | Ht 66.0 in | Wt 186.0 lb

## 2017-02-17 DIAGNOSIS — F17211 Nicotine dependence, cigarettes, in remission: Secondary | ICD-10-CM

## 2017-02-17 DIAGNOSIS — Z1211 Encounter for screening for malignant neoplasm of colon: Secondary | ICD-10-CM | POA: Diagnosis not present

## 2017-02-17 DIAGNOSIS — R918 Other nonspecific abnormal finding of lung field: Secondary | ICD-10-CM

## 2017-02-17 DIAGNOSIS — Z0000 Encounter for general adult medical examination without abnormal findings: Secondary | ICD-10-CM

## 2017-02-17 LAB — POC HEMOCCULT BLD/STL (OFFICE/1-CARD/DIAGNOSTIC): Fecal Occult Blood, POC: NEGATIVE

## 2017-02-17 LAB — VITAMIN D 25 HYDROXY (VIT D DEFICIENCY, FRACTURES): Vit D, 25-Hydroxy: 18 ng/mL — ABNORMAL LOW (ref 30–100)

## 2017-02-17 NOTE — Patient Instructions (Addendum)
f/u in 6 month, call if you need me sooner  Please work on good  health habits so that your health will improve. 1. Commitment to daily physical activity for 30 to 60  minutes, if you are able to do this.  2. Commitment to wise food choices. Aim for half of your  food intake to be vegetable and fruit, one quarter starchy foods, and one quarter protein. Try to eat on a regular schedule  3 meals per day, snacking between meals should be limited to vegetables or fruits or small portions of nuts. 64 ounces of water per day is generally recommended, unless you have specific health conditions, like heart failure or kidney failure where you will need to limit fluid intake.  3. Commitment to sufficient and a  good quality of physical and mental rest daily, generally between 6 to 8 hours per day.  WITH PERSISTANCE AND PERSEVERANCE, THE IMPOSSIBLE , BECOMES THE NORM! It is important that you exercise regularly at least 30 minutes 5 times a week. If you develop chest pain, have severe difficulty breathing, or feel very tired, stop exercising immediately and seek medical attention   Start vit D3 2000 IU once daily  Please cut back on fried and fatty foods and sweets, eat more fresh and frozen fruit and vegetables  You are referred for a chest scan you need this

## 2017-02-21 ENCOUNTER — Encounter: Payer: Self-pay | Admitting: Family Medicine

## 2017-02-21 NOTE — Assessment & Plan Note (Signed)

## 2017-02-21 NOTE — Progress Notes (Signed)
    Lydia Perez     MRN: [id number]      DOB: [birth year]  HPI: Patient is in for annual physical exam. No other health concerns are expressed or addressed at the visit. Recent labs, if available are reviewed. Immunization is reviewed , and  updated if needed.   PE: BP (!) 144/84 (BP Location: Left Arm, Patient Position: Sitting, Cuff Size: Normal)   Pulse 60   Temp (!) 96.5 F (35.8 C) (Temporal)   Resp 16   Ht 5\' 6"  (1.676 m)   Wt 186 lb 0.6 oz (84.4 kg)   SpO2 100%   BMI 30.03 kg/m   Rechecked BP of 138/82  Pleasant  female, alert and oriented x 3, in no cardio-pulmonary distress. Afebrile. HEENT No facial trauma or asymetry. Sinuses non tender.  Extra occullar muscles intact, pupils equally reactive to light. External ears normal, tympanic membranes clear. Oropharynx moist, no exudate. Neck: supple, no adenopathy,JVD or thyromegaly.No bruits.  Chest: Clear to ascultation bilaterally.No crackles or wheezes. Non tender to palpation  Breast: No asymetry,no masses or lumps. No tenderness. No nipple discharge or inversion. No axillary or supraclavicular adenopathy  Cardiovascular system; Heart sounds normal,  S1 and  S2 ,no S3.  No murmur, or thrill. Apical beat not displaced Peripheral pulses normal.  Abdomen: Soft, non tender, no organomegaly or masses. No bruits. Bowel sounds normal. No guarding, tenderness or rebound.  Rectal:  Normal sphincter tone. No rectal mass. Guaiac negative stool.  GU: Not examined, asymptomatic and is s/p hysterectomy   Musculoskeletal exam: Full ROM of spine, hips , shoulders and knees. No deformity ,swelling or crepitus noted. No muscle wasting or atrophy.   Neurologic: Cranial nerves 2 to 12 intact. Power, tone ,sensation and reflexes normal throughout. No disturbance in gait. No tremor.  Skin: Intact, no ulceration, erythema , scaling or rash noted. Pigmentation normal throughout  Psych; Normal mood and  affect. Judgement and concentration normal   Assessment & Plan:  Annual physical exam Annual exam as documented. Counseling done  re healthy lifestyle involving commitment to 150 minutes exercise per week, heart healthy diet, and attaining healthy weight.The importance of adequate sleep also discussed. Regular seat belt use and home safety, is also discussed. Changes in health habits are decided on by the patient with goals and time frames  set for achieving them. Immunization and cancer screening needs are specifically addressed at this visit.   Multiple pulmonary nodules Follow up chest scan recommended since 2014, pt has not followed through explained importance of same , esp as she has h/o nicotine use, wiii order scan

## 2017-02-21 NOTE — Assessment & Plan Note (Signed)
Follow up chest scan recommended since 2014, pt has not followed through explained importance of same , esp as she has h/o nicotine use, wiii order scan

## 2017-03-08 ENCOUNTER — Ambulatory Visit (HOSPITAL_COMMUNITY)
Admission: RE | Admit: 2017-03-08 | Discharge: 2017-03-08 | Disposition: A | Discharge status: Home / Self Care | Payer: BLUE CROSS/BLUE SHIELD | Source: Ambulatory Visit | Attending: Family Medicine | Admitting: Family Medicine

## 2017-03-08 DIAGNOSIS — F17211 Nicotine dependence, cigarettes, in remission: Secondary | ICD-10-CM | POA: Insufficient documentation

## 2017-03-08 DIAGNOSIS — R918 Other nonspecific abnormal finding of lung field: Secondary | ICD-10-CM | POA: Insufficient documentation

## 2017-03-09 ENCOUNTER — Encounter: Payer: Self-pay | Admitting: Family Medicine

## 2017-04-22 ENCOUNTER — Telehealth: Payer: Self-pay | Admitting: Family Medicine

## 2017-04-22 NOTE — Telephone Encounter (Signed)
Patient is requesting a refill for amlodipine , only 1 left, no refills.  [facility]  Cb#: [phone]

## 2017-04-23 ENCOUNTER — Other Ambulatory Visit: Payer: Self-pay

## 2017-04-23 DIAGNOSIS — I10 Essential (primary) hypertension: Secondary | ICD-10-CM

## 2017-04-23 MED ORDER — AMLODIPINE BESYLATE 5 MG PO TABS: 5.0000 mg | ORAL_TABLET | Freq: Every day | ORAL | 0 refills | Status: DC

## 2017-08-25 ENCOUNTER — Ambulatory Visit (INDEPENDENT_AMBULATORY_CARE_PROVIDER_SITE_OTHER): Payer: BLUE CROSS/BLUE SHIELD | Admitting: Family Medicine

## 2017-08-25 ENCOUNTER — Encounter: Payer: Self-pay | Admitting: Family Medicine

## 2017-08-25 VITALS — BP 142/90 | HR 58 | Temp 97.3°F | Resp 16 | Ht 66.0 in | Wt 180.5 lb

## 2017-08-25 DIAGNOSIS — I10 Essential (primary) hypertension: Secondary | ICD-10-CM

## 2017-08-25 DIAGNOSIS — R918 Other nonspecific abnormal finding of lung field: Secondary | ICD-10-CM

## 2017-08-25 DIAGNOSIS — E559 Vitamin D deficiency, unspecified: Secondary | ICD-10-CM | POA: Diagnosis not present

## 2017-08-25 DIAGNOSIS — F17218 Nicotine dependence, cigarettes, with other nicotine-induced disorders: Secondary | ICD-10-CM | POA: Diagnosis not present

## 2017-08-25 DIAGNOSIS — F411 Generalized anxiety disorder: Secondary | ICD-10-CM | POA: Insufficient documentation

## 2017-08-25 MED ORDER — BUSPIRONE HCL 5 MG PO TABS: 5.0000 mg | ORAL_TABLET | Freq: Three times a day (TID) | ORAL | 4 refills | Status: DC

## 2017-08-25 MED ORDER — AMLODIPINE BESYLATE 5 MG PO TABS: 5.0000 mg | ORAL_TABLET | Freq: Every day | ORAL | 0 refills | Status: DC

## 2017-08-25 NOTE — Assessment & Plan Note (Signed)

## 2017-08-25 NOTE — Assessment & Plan Note (Signed)
Updated lab needed at/ before next visit.   

## 2017-08-25 NOTE — Assessment & Plan Note (Addendum)
Start buspar every 8 hours f/u in 8 to 10 weeks

## 2017-08-25 NOTE — Assessment & Plan Note (Signed)
Not at goal, however , will opt to work on lifestyle chnages for next 6 to 8 weeks before change in medication  DASH diet and commitment to daily physical activity for a minimum of 30 minutes discussed and encouraged, as a part of hypertension management. The importance of attaining a healthy weight is also discussed.  BP/Weight 08/25/2017 02/17/2017 07/14/2016 06/23/2016 06/09/2016 06/02/2016 2/76/3943  Systolic BP 200 379 444 619 012 224 114  Diastolic BP 90 80 83 89 87 87 74  Wt. (Lbs) 180.5 186.04 175 170 168 165.6 165  BMI 29.13 30.03 28.68 27.86 27.53 27.13 28.31     '

## 2017-08-25 NOTE — Patient Instructions (Addendum)
F/u in early January, first or 2nd week, call if you need me sooner  New for anxiety is Buspairone, take it every 8 hours, at 8am, 4 pm and 12 mn while working then change the hours when you stop working  Please stop sodas and canned foods, blood pressure slightly high  It is important that you exercise regularly at least 30 minutes 5 times a week. If you develop chest pain, have severe difficulty breathing, or feel very tired, stop exercising immediately and seek medical attention   Chest scan in April is good  Need to STOP smoking, need to set a quit date        Steps to Quit Smoking Smoking tobacco can be bad for your health. It can also affect almost every organ in your body. Smoking puts you and people around you at risk for many serious long-lasting (chronic) diseases. Quitting smoking is hard, but it is one of the best things that you can do for your health. It is never too late to quit. What are the benefits of quitting smoking? When you quit smoking, you lower your risk for getting serious diseases and conditions. They can include:  Lung cancer or lung disease.  Heart disease.  Stroke.  Heart attack.  Not being able to have children (infertility).  Weak bones (osteoporosis) and broken bones (fractures).  If you have coughing, wheezing, and shortness of breath, those symptoms may get better when you quit. You may also get sick less often. If you are pregnant, quitting smoking can help to lower your chances of having a baby of low birth weight. What can I do to help me quit smoking? Talk with your doctor about what can help you quit smoking. Some things you can do (strategies) include:  Quitting smoking totally, instead of slowly cutting back how much you smoke over a period of time.  Going to in-person counseling. You are more likely to quit if you go to many counseling sessions.  Using resources and support systems, such as: ? Database administrator with a  Social worker. ? Phone quitlines. ? Careers information officer. ? Support groups or group counseling. ? Text messaging programs. ? Mobile phone apps or applications.  Taking medicines. Some of these medicines may have nicotine in them. If you are pregnant or breastfeeding, do not take any medicines to quit smoking unless your doctor says it is okay. Talk with your doctor about counseling or other things that can help you.  Talk with your doctor about using more than one strategy at the same time, such as taking medicines while you are also going to in-person counseling. This can help make quitting easier. What things can I do to make it easier to quit? Quitting smoking might feel very hard at first, but there is a lot that you can do to make it easier. Take these steps:  Talk to your family and friends. Ask them to support and encourage you.  Call phone quitlines, reach out to support groups, or work with a Social worker.  Ask people who smoke to not smoke around you.  Avoid places that make you want (trigger) to smoke, such as: ? Bars. ? Parties. ? Smoke-break areas at work.  Spend time with people who do not smoke.  Lower the stress in your life. Stress can make you want to smoke. Try these things to help your stress: ? Getting regular exercise. ? Deep-breathing exercises. ? Yoga. ? Meditating. ? Doing a body scan. To do this,  close your eyes, focus on one area of your body at a time from head to toe, and notice which parts of your body are tense. Try to relax the muscles in those areas.  Download or buy apps on your mobile phone or tablet that can help you stick to your quit plan. There are many free apps, such as QuitGuide from the [employer] Office manager for Disease Control and Prevention). You can find more support from smokefree.gov and other websites.  This information is not intended to replace advice given to you by your health care provider. Make sure you discuss any questions you have  with your health care provider. Document Released: 08/29/2009 Document Revised: 06/30/2016 Document Reviewed: 03/19/2015 Elsevier Interactive Patient Education  2018 [employer].

## 2017-08-25 NOTE — Assessment & Plan Note (Signed)
rept chest scan in 02/2017 shows stability and some resolution

## 2017-08-25 NOTE — Progress Notes (Signed)
Lydia Perez     MRN: [id number]      DOB: 09-17-1956   HPI Lydia Perez is here for follow up and re-evaluation of chronic medical conditions, medication management and review of any available recent lab and radiology data.  Preventive health is updated, specifically  Cancer screening and Immunization.  Pt refusing flu vaccine today, but is willing to reconsider  The PT denies any adverse reactions to current medications since the last visit.  C/o increased stress and anxiety between work and home and smoking has increased to 10 per day, not taking  Trazodone regularly as sleeps excessively when she does, though she feels depressed , her anxiety is the bigger concern Does admit to drinking excess sodas and also eating canned foods, blood pressure control is inadequate ROS Denies recent fever or chills. Denies sinus pressure, nasal congestion, ear pain or sore throat. Denies chest congestion, productive cough or wheezing. Denies chest pains, palpitations and leg swelling Denies abdominal pain, nausea, vomiting,diarrhea or constipation.   Denies dysuria, frequency, hesitancy or incontinence. Denies joint pain, swelling and limitation in mobility. Denies headaches, seizures, numbness, or tingling. . Denies skin break down or rash.   PE  BP (!) 142/90 (BP Location: Left Arm, Patient Position: Sitting, Cuff Size: Normal)   Pulse (!) 58   Temp (!) 97.3 F (36.3 C) (Other (Comment))   Resp 16   Ht 5\' 6"  (1.676 m)   Wt 180 lb 8 oz (81.9 kg)   SpO2 96%   BMI 29.13 kg/m   Patient alert and oriented and in no cardiopulmonary distress.  HEENT: No facial asymmetry, EOMI,   oropharynx pink and moist.  Neck supple no JVD, no mass.  Chest: Clear to auscultation bilaterally.  CVS: S1, S2 no murmurs, no S3.Regular rate.  ABD: Soft non tender.   Ext: No edema  MS: Adequate ROM spine, shoulders, hips and knees.  Skin: Intact, no ulcerations or rash noted.  Psych: Good eye  contact, normal affect. Memory intact not anxious or depressed appearing.  CNS: CN 2-12 intact, power,  normal throughout.no focal deficits noted.   Assessment & Plan  GAD (generalized anxiety disorder) Start buspar every 8 hours f/u in 8 to 10 weeks  Essential hypertension Not at goal, however , will opt to work on lifestyle chnages for next 6 to 8 weeks before change in medication  DASH diet and commitment to daily physical activity for a minimum of 30 minutes discussed and encouraged, as a part of hypertension management. The importance of attaining a healthy weight is also discussed.  BP/Weight 08/25/2017 02/17/2017 07/14/2016 06/23/2016 06/09/2016 06/02/2016 0/86/5784  Systolic BP 696 295 284 132 440 102 725  Diastolic BP 90 80 83 89 87 87 74  Wt. (Lbs) 180.5 186.04 175 170 168 165.6 165  BMI 29.13 30.03 28.68 27.86 27.53 27.13 28.31     '  Nicotine dependence Patient is asked and  confirms current  Nicotine use.  Five to seven minutes of time is spent in counseling the patient of the need to quit smoking  Advice to quit is delivered clearly specifically in reducing the risk of developing heart disease, having a stroke, or of developing all types of cancer, especially lung and oral cancer. Improvement in breathing and exercise tolerance and quality of life is also discussed, as is the economic benefit.  Assessment of willingness to quit or to make an attempt to quit is made and documented  Assistance in quit attempt is  made with several and varied options presented, based on patient's desire and need. These include  literature, local classes available, 1800 QUIT NOW number, OTC and prescription medication.  The GOAL to be NICOTINE FREE is re emphasized.  The patient has set a personal goal of either reduction or discontinuation and follow up is arranged between 6 an 16 weeks.    Vitamin D deficiency Updated lab needed at/ before next visit.   Multiple pulmonary  nodules rept chest scan in 02/2017 shows stability and some resolution

## 2017-10-18 ENCOUNTER — Telehealth: Payer: Self-pay | Admitting: *Deleted

## 2017-10-18 NOTE — Telephone Encounter (Signed)
The problem is with a combination drug, not with plain amlodipine, she needs to continue this, I called the hospital pharmacy, please k let her know

## 2017-10-18 NOTE — Telephone Encounter (Signed)
Patient called stating the have a recall on her blood pressure medicine and she is not taking it until she talks to Dr Moshe Cipro or Velna Hatchet. Please advise

## 2017-10-18 NOTE — Telephone Encounter (Signed)
Patient aware.

## 2017-11-17 ENCOUNTER — Ambulatory Visit: Payer: BLUE CROSS/BLUE SHIELD | Admitting: Family Medicine

## 2018-01-04 ENCOUNTER — Encounter: Payer: Self-pay | Admitting: Family Medicine

## 2018-01-04 ENCOUNTER — Ambulatory Visit (INDEPENDENT_AMBULATORY_CARE_PROVIDER_SITE_OTHER): Payer: BLUE CROSS/BLUE SHIELD | Admitting: Family Medicine

## 2018-01-04 ENCOUNTER — Other Ambulatory Visit: Payer: Self-pay

## 2018-01-04 VITALS — BP 128/84 | HR 72 | Temp 98.9°F | Resp 20 | Ht 66.0 in | Wt 173.0 lb

## 2018-01-04 DIAGNOSIS — J209 Acute bronchitis, unspecified: Secondary | ICD-10-CM | POA: Diagnosis not present

## 2018-01-04 MED ORDER — BENZONATATE 200 MG PO CAPS: 200.0000 mg | ORAL_CAPSULE | Freq: Two times a day (BID) | ORAL | 0 refills | Status: DC | PRN

## 2018-01-04 MED ORDER — AZITHROMYCIN 250 MG PO TABS: ORAL_TABLET | ORAL | 0 refills | Status: DC

## 2018-01-04 NOTE — Patient Instructions (Signed)
Rest.  Stay home at least 2 days.  Drink plenty of fluids. Use a humidifier if you have 1. May continue taking Robitussin for cough. I have prescribed Tessalon Perles to take 1 twice a day also for cough. I have prescribed azithromycin to take as an antibiotic. Call if you are not better by next week.   Work note

## 2018-01-04 NOTE — Progress Notes (Signed)
Chief Complaint  Patient presents with  . URI    x 3 weeks   Patient had an upper respiratory infection for 3 weeks.  Initially she had some cold symptoms with runny and stuffy nose.  Now it is just a cough.  She has a harsh cough that is continuous.  She states she thought it was getting better a few days ago, then over the weekend it got worse.  Now she has fever and chills.  She is coughing and fatigue.  No body aches.  She has not taken her temperature.  No nausea or vomiting.  She no longer has any runny or stuffy nose sinus congestion ear pain or sore throat.  She does not know what illnesses going around her workplace, but "everyone has a cough".  She did not get a flu shot this year.  She feels like when she gets a flu shot she gets sick.  I reminded her that even if she does not get a flu shot  she is going to get sick, and a flu shot is her best defense against influenza.  Patient Active Problem List   Diagnosis Date Noted  . GAD (generalized anxiety disorder) 08/25/2017  . Chronic eczema 02/25/2016  . Nicotine dependence 06/12/2014  . Dyslipidemia 09/06/2013  . Prediabetes 09/06/2013  . Vitamin D deficiency 09/06/2013  . Internal hemorrhoids with other complication 97/98/9211  . Multiple pulmonary nodules 01/09/2013  . Depression 06/30/2012  . Essential hypertension 01/16/2011  . Obesity (BMI 30.0-34.9) 10/18/2010  . GERD 08/18/2009  . PLANTAR WART 07/10/2008  . FIBROCYSTIC BREAST DISEASE 01/24/2007  . ANEMIA-NOS 12/21/2006  . HIATAL HERNIA 12/21/2006    Outpatient Encounter Medications as of 01/04/2018  Medication Sig  . amLODipine (NORVASC) 5 MG tablet Take 1 tablet (5 mg total) by mouth daily.  . busPIRone (BUSPAR) 5 MG tablet Take 1 tablet (5 mg total) by mouth 3 (three) times daily.  . QUEtiapine Fumarate (SEROQUEL XR) 150 MG 24 hr tablet Take 1 tablet (150 mg total) by mouth at bedtime.  Marland Kitchen azithromycin (ZITHROMAX) 250 MG tablet tad  . benzonatate (TESSALON) 200  MG capsule Take 1 capsule (200 mg total) by mouth 2 (two) times daily as needed for cough.   No facility-administered encounter medications on file as of 01/04/2018.     Allergies  Allergen Reactions  . Codeine Other (See Comments)    Hallucinations    Review of Systems  Constitutional: Positive for activity change, appetite change, chills, fatigue and fever.  HENT: Positive for congestion. Negative for postnasal drip, rhinorrhea, sinus pressure and sore throat.        Head symptoms have resolved  Eyes: Negative for photophobia, redness and visual disturbance.  Respiratory: Positive for cough and shortness of breath. Negative for wheezing.        Harsh cough, nonproductive  Cardiovascular: Negative for chest pain.  Gastrointestinal: Negative for diarrhea, nausea and vomiting.  Genitourinary: Negative for dysuria, flank pain and frequency.  Musculoskeletal: Positive for myalgias. Negative for arthralgias.  Neurological: Positive for headaches. Negative for dizziness.  Psychiatric/Behavioral: Positive for sleep disturbance. Negative for dysphoric mood. The patient is not nervous/anxious.     BP 128/84 (BP Location: Left Arm, Patient Position: Sitting, Cuff Size: Normal)   Pulse 72   Temp 98.9 F (37.2 C) (Oral)   Resp 20   Ht 5\' 6"  (1.676 m)   Wt 173 lb 0.6 oz (78.5 kg)   SpO2 99%   BMI 27.93  kg/m   Physical Exam  Constitutional: She is oriented to person, place, and time. She appears well-developed and well-nourished.  HENT:  Head: Normocephalic and atraumatic.  Right Ear: External ear normal.  Left Ear: External ear normal.  Mouth/Throat: Oropharynx is clear and moist.  Posterior pharynx clear  Eyes: Conjunctivae are normal. Pupils are equal, round, and reactive to light.  Neck: Normal range of motion. Neck supple. No thyromegaly present.  No adenopathy  Cardiovascular: Normal rate, regular rhythm and normal heart sounds.  Pulmonary/Chest: Effort normal and breath  sounds normal. No respiratory distress.  Central rhonchi anterior chest, no rales in bases  Abdominal: Soft. Bowel sounds are normal.  No organomegaly  Musculoskeletal: Normal range of motion. She exhibits no edema.  Lymphadenopathy:    She has no cervical adenopathy.  Neurological: She is alert and oriented to person, place, and time.  Gait normal  Skin: Skin is warm and dry. No rash noted.  Psychiatric: She has a normal mood and affect. Her behavior is normal. Thought content normal.  Nursing note and vitals reviewed.   ASSESSMENT/PLAN:  1. Acute bronchitis, unspecified organism I discussed that most of the bronchitis is caused by a virus.  Bronchitis cough can last for weeks.  Worsening of her symptoms after 2 weeks of coughing, and developing a fever is somewhat worry some.  I do not hear pneumonia but feel at this point should cover her with an antibiotic.  I am giving her some stronger cough medicine.  Hopefully she needs to stay home from work for at least 2 days.  She will call if not improving.   Patient Instructions  Rest.  Stay home at least 2 days.  Drink plenty of fluids. Use a humidifier if you have 1. May continue taking Robitussin for cough. I have prescribed Tessalon Perles to take 1 twice a day also for cough. I have prescribed azithromycin to take as an antibiotic. Call if you are not better by next week.   Work note   Raylene Everts, MD

## 2018-03-14 ENCOUNTER — Telehealth: Payer: Self-pay | Admitting: Family Medicine

## 2018-03-14 NOTE — Telephone Encounter (Signed)
Bowling a week ago today, and had a sharp pain when throwing the bowling ball, and the pain in consistant , and not going away, she has soaked, and taken aleve with no relief, [phone], please call

## 2018-03-17 NOTE — Telephone Encounter (Signed)
Left voicemail for patient to call and save appt for tomorrow at 1:20

## 2018-03-17 NOTE — Telephone Encounter (Signed)
She may continue to take advil or alleve, but NOT both. She can come in for evaluation tomorrow if needed. There is slots on the schedule.

## 2018-03-17 NOTE — Telephone Encounter (Signed)
Can come tomorrow at 1:20. Tried to call patient, no answer, left message

## 2018-03-17 NOTE — Telephone Encounter (Signed)
Went to throw a bowling ball last Monday and got a very sharp pain in her lower back that shot down both legs. Has been taking ibuprofen and aleve. The pain isn't in her back anymore but having sciatic pain in her left buttock down her leg into her calf. Hurts very badly if she lays down and then tries to get up and walk. Doesn't know what to do or take for it. Please advise if anything can be recommended

## 2018-03-18 ENCOUNTER — Other Ambulatory Visit: Payer: Self-pay

## 2018-03-18 ENCOUNTER — Ambulatory Visit (INDEPENDENT_AMBULATORY_CARE_PROVIDER_SITE_OTHER): Payer: BLUE CROSS/BLUE SHIELD | Admitting: Family Medicine

## 2018-03-18 ENCOUNTER — Encounter: Payer: Self-pay | Admitting: Family Medicine

## 2018-03-18 VITALS — BP 120/80 | HR 83 | Temp 98.5°F | Resp 16 | Ht 64.0 in | Wt 168.0 lb

## 2018-03-18 DIAGNOSIS — M5442 Lumbago with sciatica, left side: Secondary | ICD-10-CM

## 2018-03-18 MED ORDER — TRIAMCINOLONE ACETONIDE 40 MG/ML IJ SUSP
40.0000 mg | Freq: Once | INTRAMUSCULAR | Status: AC
  Administered 2018-03-18: 40 mg via INTRAMUSCULAR

## 2018-03-18 MED ORDER — CYCLOBENZAPRINE HCL 10 MG PO TABS: 10.0000 mg | ORAL_TABLET | Freq: Three times a day (TID) | ORAL | 0 refills | Status: DC | PRN

## 2018-03-18 MED ORDER — DICLOFENAC SODIUM 75 MG PO TBEC: 75.0000 mg | DELAYED_RELEASE_TABLET | Freq: Two times a day (BID) | ORAL | 0 refills | Status: DC

## 2018-03-18 MED ORDER — KETOROLAC TROMETHAMINE 60 MG/2ML IM SOLN
60.0000 mg | Freq: Once | INTRAMUSCULAR | Status: AC
Start: 2018-03-18 — End: 2018-03-18
  Administered 2018-03-18: 60 mg via INTRAMUSCULAR

## 2018-03-18 NOTE — Progress Notes (Signed)
Patient ID: Lydia Perez, female    DOB: June 11, 1956, 62 y.o.   MRN: [id number]  Chief Complaint  Patient presents with  . Leg Pain    2 weeks ago, Monday, went bowling, pain hit lower back, down both legs- left side still hurts    Allergies Codeine  Subjective:   Lydia Perez is a 62 y.o. female who presents to [facility] today.  HPI 2 weeks ago, bowling and was throwing ball just like normal. Went to throw the ball and developed and sharp pain that was in the middle of the back and it immediately felt like all the muscles in her back tightened up.  Reports that initially she had pain in her lower back which radiates to the sides and then down her legs.  She reports that since that time it is improved and she only has pain on the left side of her back, into her gluteal region and down her leg. Feels like a tightness and squeezing pain in her muscles. Moving certain ways hurt and exacerbate the pain. When lay down and then stand back up the pain is bad. No numbness or tingling.  . Starts in gluteal area and goes down to the calf. Her calf is not swollen. Urinating normal. Has tired alleve and ibuprofen-they ease the pain off, but then it comes bad. Pain is 10/10.  Reports that her muscles have not been swollen or no associated redness.  She has not had any fevers.  No urinary hesitancy or urgency.  No blood in her urine.  No fever, chills, nausea, vomiting or diarrhea.  Has no known history of kidney disease.  Reports her only medications include her Seroquel and Norvasc.  She reports her blood pressure is well controlled.  Denies any chest pain, shortness of breath, or palpitations.  Reports that she has received shots in the past from Dr. Moshe Cipro and they have helped with her pain.   Past Medical History:  Diagnosis Date  . Anemia    NOS   . Bipolar disorder ([place]) 2010   dx by PCP   . Esophageal stricture   . Hiatal hernia   . History of cardiac cath 2002     Past Surgical History:  Procedure Laterality Date  . COLONOSCOPY  08/13/2008   SLF: SIMPLE ADENOMA/HYPERPLASTIC POLYP  . COLONOSCOPY N/A 05/15/2013   SLF:SIX COLORECTALPOLYPS REMOVED./RECTAL BLEEDING DUE TO Moderate sized internal hemorrhoids  . ESOPHAGOGASTRODUODENOSCOPY  07/19/2007   SLF: Normal  . ESOPHAGOGASTRODUODENOSCOPY (EGD) WITH ESOPHAGEAL DILATION N/A 05/15/2013   XNA:TFTD Non-erosive gastritis  . HEMORRHOID BANDING N/A 05/15/2013   Procedure: HEMORRHOID BANDING;  Surgeon: Danie Binder, MD;  Location: [facility];  Service: Endoscopy;  Laterality: N/A;  . TOTAL ABDOMINAL HYSTERECTOMY W/ BILATERAL SALPINGOOPHORECTOMY  01/2005  . TUBAL LIGATION      Family History  Problem Relation Age of Onset  . Emphysema Father        was a smoker  . Lung cancer Father        was a smoker  . Kidney disease Mother   . Heart disease Mother        1st by pass in her 54's   . Heart failure Mother   . Diabetes Mother   . Hypertension Mother   . Heart attack Brother   . Heart failure Brother   . Colon cancer Neg Hx   . Colon polyps Neg Hx      Social History  Socioeconomic History  . Marital status: Married    Spouse name: Not on file  . Number of children: 3  . Years of education: Not on file  . Highest education level: Not on file  Occupational History  . Occupation: Engineer, petroleum: [employer]  Social Needs  . Financial resource strain: Not on file  . Food insecurity:    Worry: Not on file    Inability: Not on file  . Transportation needs:    Medical: Not on file    Non-medical: Not on file  Tobacco Use  . Smoking status: Current Every Day Smoker    Packs/day: 0.50    Years: 25.00    Pack years: 12.50    Types: Cigarettes    Start date: 02/17/1973    Last attempt to quit: 11/25/2016    Years since quitting: 1.3  . Smokeless tobacco: Never Used  . Tobacco comment: current is 5 per day in 06/2015, down form 1 PPD in 11/2014  Substance and  Sexual Activity  . Alcohol use: No    Alcohol/week: 0.0 oz  . Drug use: No    Comment: marijuana   . Sexual activity: Yes    Birth control/protection: Surgical  Lifestyle  . Physical activity:    Days per week: Not on file    Minutes per session: Not on file  . Stress: Not on file  Relationships  . Social connections:    Talks on phone: Not on file    Gets together: Not on file    Attends religious service: Not on file    Active member of club or organization: Not on file    Attends meetings of clubs or organizations: Not on file    Relationship status: Not on file  Other Topics Concern  . Not on file  Social History Narrative  . Not on file   Current Outpatient Medications on File Prior to Visit  Medication Sig Dispense Refill  . amLODipine (NORVASC) 5 MG tablet Take 1 tablet (5 mg total) by mouth daily. 90 tablet 0  . QUEtiapine Fumarate (SEROQUEL XR) 150 MG 24 hr tablet Take 1 tablet (150 mg total) by mouth at bedtime. 30 tablet 2   No current facility-administered medications on file prior to visit.     Review of Systems  Constitutional: Negative for activity change, appetite change, chills, diaphoresis, fatigue and fever.  HENT: Negative for trouble swallowing and voice change.   Eyes: Negative for visual disturbance.  Respiratory: Negative for cough, chest tightness and shortness of breath.   Cardiovascular: Negative for chest pain, palpitations and leg swelling.  Gastrointestinal: Negative for abdominal pain, nausea and vomiting.  Genitourinary: Negative for dysuria, frequency, hematuria and urgency.  Musculoskeletal: Positive for arthralgias, back pain and gait problem. Negative for joint swelling, neck pain and neck stiffness.       Back is sore and hurts when she walks.  Is supposed to go to work Midwife.  Skin: Negative for rash.  Neurological: Negative for dizziness, tremors, seizures, syncope, facial asymmetry, speech difficulty, weakness, light-headedness,  numbness and headaches.  Hematological: Negative for adenopathy.  Psychiatric/Behavioral: Negative for behavioral problems, dysphoric mood, sleep disturbance and suicidal ideas. The patient is not nervous/anxious.      Objective:   BP 120/80 (BP Location: Left Arm, Patient Position: Sitting, Cuff Size: Normal)   Pulse 83   Temp 98.5 F (36.9 C) (Temporal)   Resp 16   Ht 5\' 4"  (1.626 m)  Wt 168 lb (76.2 kg)   SpO2 97%   BMI 28.84 kg/m   Physical Exam  Constitutional: She is oriented to person, place, and time. She appears well-developed and well-nourished. No distress.  HENT:  Head: Normocephalic and atraumatic.  Eyes: Pupils are equal, round, and reactive to light.  Neck: Normal range of motion. Neck supple. No thyromegaly present.  Cardiovascular: Normal rate, regular rhythm and normal heart sounds.  Pulmonary/Chest: Effort normal and breath sounds normal. No respiratory distress.  Musculoskeletal:       Lumbar back: She exhibits decreased range of motion, tenderness, pain and spasm. She exhibits no bony tenderness, no swelling, no edema, no deformity, no laceration and normal pulse.  Patient with low back muscle tenderness to palpation.  Patient with L4-L5 paraspinal muscle tenderness.  No bony tenderness to palpation.  Tenderness with palpation of quadriceps.  Range of motion limited secondary to pain.  However strength intact in upper and lower extremities.  Deep tendon reflexes intact.  Sensation intact bilaterally.  Cranial nerves II through XII grossly intact.  Neurological: She is alert and oriented to person, place, and time. No cranial nerve deficit.  Skin: Skin is warm and dry.  Psychiatric: She has a normal mood and affect. Her speech is normal and behavior is normal. Judgment and thought content normal. Cognition and memory are normal. She expresses no homicidal ideation. She expresses no homicidal plans.  Nursing note and vitals reviewed.    Assessment and Plan   1. Acute left-sided low back pain with left-sided sciatica Counseled regarding low back muscular pain. She was counseled she could use a heating pad 20 minutes, several times a day as needed. Patient was told that if her symptoms did not resolve or they worsen that she should follow-up. She was counseled regarding worrisome signs and symptoms of low back pain and if those develop to go to the emergency department. She was counseled regarding the possible sedative effects of her injections and of her prescribed medications. She was counseled regarding the risk versus benefits of steroid injection and would like to receive the steroid injection. Discussed risks of cardiovascular thrombotic events related to NSAIDS. Discussed increased risk of AMI and CVA. Discussed risk of serious GI adverse events including bleeding, ulcers, and perforation. Patient understands risks of this medication.   We discussed the pattern of low back pain in the timeframe of which her pain should improve.  She was counseled not to participate in bowling until her pain is improved.  She was counseled that she should not stay in bed and she should continue movement and stretching exercises as tolerated.  She was told to call with any questions or concerns.  Medications sent to pharmacy. - diclofenac (VOLTAREN) 75 MG EC tablet; Take 1 tablet (75 mg total) by mouth 2 (two) times daily.  Dispense: 30 tablet; Refill: 0 - cyclobenzaprine (FLEXERIL) 10 MG tablet; Take 1 tablet (10 mg total) by mouth 3 (three) times daily as needed for muscle spasms.  Dispense: 30 tablet; Refill: 0 - ketorolac (TORADOL) injection 60 mg - triamcinolone acetonide (KENALOG-40) injection 40 mg Patient was told to schedule a follow-up appointment with Dr. Moshe Cipro. Return if symptoms worsen or fail to improve, for PCP/follow-up. Caren Macadam, MD 03/18/2018

## 2018-03-18 NOTE — Patient Instructions (Signed)
Voltaren 75 mg twice a day for 2 weeks  Call with questions or concerns.  Follow up with Dr. Moshe Cipro.

## 2018-04-18 ENCOUNTER — Telehealth: Payer: Self-pay | Admitting: Family Medicine

## 2018-04-18 NOTE — Telephone Encounter (Signed)
Pt is calling in having leg pain, wants to be seen, there is nothing for the next couple of days. She requested that you call her.

## 2018-04-19 NOTE — Telephone Encounter (Signed)
Scheduled appt for thurs, pt aware

## 2018-04-21 ENCOUNTER — Ambulatory Visit (HOSPITAL_COMMUNITY)
Admission: RE | Admit: 2018-04-21 | Discharge: 2018-04-21 | Disposition: A | Discharge status: Home / Self Care | Payer: BLUE CROSS/BLUE SHIELD | Source: Ambulatory Visit | Attending: Family Medicine | Admitting: Family Medicine

## 2018-04-21 ENCOUNTER — Encounter: Payer: Self-pay | Admitting: Family Medicine

## 2018-04-21 ENCOUNTER — Ambulatory Visit: Payer: BLUE CROSS/BLUE SHIELD | Admitting: Family Medicine

## 2018-04-21 VITALS — BP 142/88 | HR 62 | Resp 16 | Ht 64.0 in | Wt 168.0 lb

## 2018-04-21 DIAGNOSIS — M5442 Lumbago with sciatica, left side: Secondary | ICD-10-CM | POA: Insufficient documentation

## 2018-04-21 DIAGNOSIS — Z1231 Encounter for screening mammogram for malignant neoplasm of breast: Secondary | ICD-10-CM

## 2018-04-21 DIAGNOSIS — G8929 Other chronic pain: Secondary | ICD-10-CM

## 2018-04-21 DIAGNOSIS — I10 Essential (primary) hypertension: Secondary | ICD-10-CM | POA: Diagnosis not present

## 2018-04-21 DIAGNOSIS — Z1239 Encounter for other screening for malignant neoplasm of breast: Secondary | ICD-10-CM

## 2018-04-21 DIAGNOSIS — E663 Overweight: Secondary | ICD-10-CM

## 2018-04-21 DIAGNOSIS — M545 Low back pain: Secondary | ICD-10-CM | POA: Diagnosis not present

## 2018-04-21 MED ORDER — KETOROLAC TROMETHAMINE 60 MG/2ML IM SOLN
60.0000 mg | Freq: Once | INTRAMUSCULAR | Status: AC
  Administered 2018-04-21: 60 mg via INTRAMUSCULAR

## 2018-04-21 MED ORDER — RANITIDINE HCL 300 MG PO TABS: 300.0000 mg | ORAL_TABLET | Freq: Every day | ORAL | 0 refills | Status: DC

## 2018-04-21 MED ORDER — PREDNISONE 10 MG (21) PO TBPK: ORAL_TABLET | ORAL | 0 refills | Status: DC

## 2018-04-21 MED ORDER — METHYLPREDNISOLONE ACETATE 80 MG/ML IJ SUSP
80.0000 mg | Freq: Once | INTRAMUSCULAR | Status: AC
  Administered 2018-04-21: 80 mg via INTRAMUSCULAR

## 2018-04-21 MED ORDER — GABAPENTIN 100 MG PO CAPS: 100.0000 mg | ORAL_CAPSULE | Freq: Every day | ORAL | 1 refills | Status: DC

## 2018-04-21 MED ORDER — NAPROXEN 500 MG PO TABS: 500.0000 mg | ORAL_TABLET | Freq: Two times a day (BID) | ORAL | 0 refills | Status: DC

## 2018-04-21 MED ORDER — AMLODIPINE BESYLATE 5 MG PO TABS: 5.0000 mg | ORAL_TABLET | Freq: Every day | ORAL | 3 refills | Status: DC

## 2018-04-21 NOTE — Assessment & Plan Note (Addendum)
Uncontrolled.Toradol and depo medrol administered IM in the office , to be followed by a short course of oral prednisone and NSAIDS.bedtime gabapentin, X ray lumbar spine,  Call in 3 weeks if ongoing pain. Pt has grade 4 power on exam at today's visit

## 2018-04-21 NOTE — Patient Instructions (Addendum)
Please cancel 6/12 appt, and schedule annual physical exam for end August instead  Please get an X ray of your low back today  Call if you still have a lot of back pain I the next 3 weeks please  Please schedule mammogram at checkout  You will get injections in office today for back pain with left sciatica , also 4 medications are sent in  Please get CBC, fasting lipid , cmp and EGFR, l\TSH and vit D 1 week before next visit  Please work on good  health habits so that your health will improve. 1. Commitment to daily physical activity for 30 to 60  minutes, if you are able to do this.  2. Commitment to wise food choices. Aim for half of your  food intake to be vegetable and fruit, one quarter starchy foods, and one quarter protein. Try to eat on a regular schedule  3 meals per day, snacking between meals should be limited to vegetables or fruits or small portions of nuts. 64 ounces of water per day is generally recommended, unless you have specific health conditions, like heart failure or kidney failure where you will need to limit fluid intake.  3. Commitment to sufficient and a  good quality of physical and mental rest daily, generally between 6 to 8 hours per day.  WITH PERSISTANCE AND PERSEVERANCE, THE IMPOSSIBLE , BECOMES THE NORM!   It is important that you exercise regularly at least 30 minutes 5 times a week. If you develop chest pain, have severe difficulty breathing, or feel very tired, stop exercising immediately and seek medical attention

## 2018-04-21 NOTE — Progress Notes (Signed)
   Lydia Perez     MRN: [id number]      DOB: [birth year]   HPI [name] is here with a 1 monht h/o low back pain with bilateral sciartica which statrted whoile bowling, still rated at a 10, now radiaites to left mid calf, constant, reduced power and sensation on left leg has never had this before. After sitting or lying has to position herself for relief Denies incontinence of stool or urine She was  Treated about 5 weeks ago and still is very symptomatic, was prescribed muscle relaxant, but states of no benefit  ROS Denies recent fever or chills. Denies sinus pressure, nasal congestion, ear pain or sore throat. Denies chest congestion, productive cough or wheezing. Denies chest pains, palpitations and leg swelling Denies abdominal pain, nausea, vomiting,diarrhea or constipation.   Denies dysuria, frequency, hesitancy or incontinence.   Denies skin break down or rash.   PE  BP (!) 142/88   Pulse 62   Resp 16   Ht 5\' 4"  (1.626 m)   Wt 168 lb (76.2 kg)   SpO2 99%   BMI 28.84 kg/m   Pt in pain  Patient alert and oriented and in no cardiopulmonary distress.  HEENT: No facial asymmetry, EOMI,   oropharynx pink and moist.  Neck supple no JVD, no mass.  Chest: Clear to auscultation bilaterally.  CVS: S1, S2 no murmurs, no S3.Regular rate.  ABD: Soft non tender.   Ext: No edema  MS: Decreased  ROM lumbar  Spine, adequate in  shoulders, decreased in left hip and knee secondary to pain.  Skin: Intact, no ulcerations or rash noted.  Psych: Good eye contact, normal affect. Memory intact not anxious or depressed appearing.  CNS: CN 2-12 intact, grade 4 power in LLE and normal sensation throughout    Assessment & Plan  Low back pain with left-sided sciatica Uncontrolled.Toradol and depo medrol administered IM in the office , to be followed by a short course of oral prednisone and NSAIDS.bedtime gabapentin, X ray lumbar spine,  Call in 3 weeks if ongoing pain. Pt  has grade 4 power on exam at today's visit   Essential hypertension Elevated at visit , but pt insignificant pain, no med change at this time DASH diet and commitment to daily physical activity for a minimum of 30 minutes discussed and encouraged, as a part of hypertension management. The importance of attaining a healthy weight is also discussed.  BP/Weight 04/21/2018 03/18/2018 01/04/2018 08/25/2017 02/17/2017 0/35/0093 06/16/8298  Systolic BP 371 696 789 381 017 510 258  Diastolic BP 88 80 84 90 80 83 89  Wt. (Lbs) 168 168 173.04 180.5 186.04 175 170  BMI 28.84 28.84 27.93 29.13 30.03 28.68 27.86       Overweight (BMI 25.0-29.9) Unchanged. Patient re-educated about  the importance of commitment to a  minimum of 150 minutes of exercise per week.  The importance of healthy food choices with portion control discussed. Encouraged to start a food diary, count calories and to consider  joining a support group. Sample diet sheets offered. Goals set by the patient for the next several months.   Weight /BMI 04/21/2018 03/18/2018 01/04/2018  WEIGHT 168 lb 168 lb 173 lb 0.6 oz  HEIGHT 5\' 4"  5\' 4"  5\' 6"   BMI 28.84 kg/m2 28.84 kg/m2 27.93 kg/m2

## 2018-04-23 ENCOUNTER — Encounter: Payer: Self-pay | Admitting: Family Medicine

## 2018-04-23 NOTE — Assessment & Plan Note (Signed)
Unchanged. Patient re-educated about  the importance of commitment to a  minimum of 150 minutes of exercise per week.  The importance of healthy food choices with portion control discussed. Encouraged to start a food diary, count calories and to consider  joining a support group. Sample diet sheets offered. Goals set by the patient for the next several months.   Weight /BMI 04/21/2018 03/18/2018 01/04/2018  WEIGHT 168 lb 168 lb 173 lb 0.6 oz  HEIGHT 5\' 4"  5\' 4"  5\' 6"   BMI 28.84 kg/m2 28.84 kg/m2 27.93 kg/m2

## 2018-04-23 NOTE — Assessment & Plan Note (Signed)
Elevated at visit , but pt insignificant pain, no med change at this time DASH diet and commitment to daily physical activity for a minimum of 30 minutes discussed and encouraged, as a part of hypertension management. The importance of attaining a healthy weight is also discussed.  BP/Weight 04/21/2018 03/18/2018 01/04/2018 08/25/2017 02/17/2017 12/07/2409 02/19/4313  Systolic BP 276 701 100 349 611 643 539  Diastolic BP 88 80 84 90 80 83 89  Wt. (Lbs) 168 168 173.04 180.5 186.04 175 170  BMI 28.84 28.84 27.93 29.13 30.03 28.68 27.86

## 2018-04-25 ENCOUNTER — Ambulatory Visit (HOSPITAL_COMMUNITY)
Admission: RE | Admit: 2018-04-25 | Discharge: 2018-04-25 | Disposition: A | Discharge status: Home / Self Care | Payer: BLUE CROSS/BLUE SHIELD | Source: Ambulatory Visit | Attending: Family Medicine | Admitting: Family Medicine

## 2018-04-25 ENCOUNTER — Encounter (HOSPITAL_COMMUNITY): Payer: Self-pay

## 2018-04-25 DIAGNOSIS — Z1239 Encounter for other screening for malignant neoplasm of breast: Secondary | ICD-10-CM

## 2018-04-25 DIAGNOSIS — Z1231 Encounter for screening mammogram for malignant neoplasm of breast: Secondary | ICD-10-CM | POA: Diagnosis not present

## 2018-04-27 ENCOUNTER — Ambulatory Visit (HOSPITAL_COMMUNITY): Payer: BLUE CROSS/BLUE SHIELD

## 2018-04-27 ENCOUNTER — Ambulatory Visit: Payer: BLUE CROSS/BLUE SHIELD | Admitting: Family Medicine

## 2018-07-06 ENCOUNTER — Encounter: Payer: BLUE CROSS/BLUE SHIELD | Admitting: Family Medicine

## 2018-09-14 ENCOUNTER — Other Ambulatory Visit (HOSPITAL_COMMUNITY)
Admission: RE | Admit: 2018-09-14 | Discharge: 2018-09-14 | Disposition: A | Discharge status: Home / Self Care | Payer: BLUE CROSS/BLUE SHIELD | Source: Ambulatory Visit | Attending: Family Medicine | Admitting: Family Medicine

## 2018-09-14 ENCOUNTER — Ambulatory Visit (INDEPENDENT_AMBULATORY_CARE_PROVIDER_SITE_OTHER): Payer: BLUE CROSS/BLUE SHIELD | Admitting: Family Medicine

## 2018-09-14 ENCOUNTER — Encounter: Payer: Self-pay | Admitting: Family Medicine

## 2018-09-14 ENCOUNTER — Encounter: Payer: Self-pay | Admitting: Gastroenterology

## 2018-09-14 VITALS — BP 142/88 | HR 58 | Resp 15 | Ht 64.0 in | Wt 168.0 lb

## 2018-09-14 DIAGNOSIS — Z0000 Encounter for general adult medical examination without abnormal findings: Secondary | ICD-10-CM

## 2018-09-14 DIAGNOSIS — E663 Overweight: Secondary | ICD-10-CM | POA: Diagnosis not present

## 2018-09-14 DIAGNOSIS — I10 Essential (primary) hypertension: Secondary | ICD-10-CM | POA: Insufficient documentation

## 2018-09-14 DIAGNOSIS — F17218 Nicotine dependence, cigarettes, with other nicotine-induced disorders: Secondary | ICD-10-CM | POA: Insufficient documentation

## 2018-09-14 DIAGNOSIS — R7303 Prediabetes: Secondary | ICD-10-CM

## 2018-09-14 DIAGNOSIS — Z6828 Body mass index (BMI) 28.0-28.9, adult: Secondary | ICD-10-CM

## 2018-09-14 DIAGNOSIS — R194 Change in bowel habit: Secondary | ICD-10-CM

## 2018-09-14 DIAGNOSIS — F329 Major depressive disorder, single episode, unspecified: Secondary | ICD-10-CM

## 2018-09-14 DIAGNOSIS — K648 Other hemorrhoids: Secondary | ICD-10-CM | POA: Diagnosis not present

## 2018-09-14 DIAGNOSIS — R195 Other fecal abnormalities: Secondary | ICD-10-CM

## 2018-09-14 DIAGNOSIS — Z124 Encounter for screening for malignant neoplasm of cervix: Secondary | ICD-10-CM

## 2018-09-14 DIAGNOSIS — E559 Vitamin D deficiency, unspecified: Secondary | ICD-10-CM

## 2018-09-14 DIAGNOSIS — D126 Benign neoplasm of colon, unspecified: Secondary | ICD-10-CM

## 2018-09-14 MED ORDER — NICOTINE 14 MG/24HR TD PT24: 14.0000 mg | MEDICATED_PATCH | Freq: Every day | TRANSDERMAL | 0 refills | Status: DC

## 2018-09-14 MED ORDER — NICOTINE 7 MG/24HR TD PT24: 7.0000 mg | MEDICATED_PATCH | Freq: Every day | TRANSDERMAL | 2 refills | Status: AC

## 2018-09-14 NOTE — Assessment & Plan Note (Signed)
Unchanged Patient re-educated about  the importance of commitment to a  minimum of 150 minutes of exercise per week.  The importance of healthy food choices with portion control discussed. Encouraged to start a food diary, count calories and to consider  joining a support group. Sample diet sheets offered. Goals set by the patient for the next several months.   Weight /BMI 09/14/2018 04/21/2018 03/18/2018  WEIGHT 168 lb 168 lb 168 lb  HEIGHT 5\' 4"  5\' 4"  5\' 4"   BMI 28.84 kg/m2 28.84 kg/m2 28.84 kg/m2

## 2018-09-14 NOTE — Assessment & Plan Note (Signed)
Uncontrolled, no change in medication, neds to quit smoking, lose weight 5 to 7 pounds and aslso stop processed foods Follow DASH diet  Commit to regular exercise F/u in 3 months

## 2018-09-14 NOTE — Assessment & Plan Note (Signed)
Asked: confirms currently smoking , due to stress, 10/day Assess; wants to quit,no date set Assist: counselled for 5 mins, and literature provided as well as nicoderm patch prescribed 14 mg and 7 mg x 3 months total Advie: need to quit tomreduce cancer, MI and CVA Arrange : f/u in 3 months

## 2018-09-14 NOTE — Progress Notes (Signed)
Lydia Perez     MRN: [id number]      DOB: 05/21/1956  HPI: Patient is in for annual physical exam. Grieving the loss of her Mother in law in 05/2018, though she was ill, pt did not expect her to " die the way that she did" Recent labs, if available are reviewed. Immunization is reviewed , refuses flu vaccine and will consider shingrix   PE: BP (!) 142/88   Pulse (!) 58   Resp 15   Ht 5\' 4"  (1.626 m)   Wt 168 lb (76.2 kg)   SpO2 100%   BMI 28.84 kg/m   Pleasant  female, alert and oriented x 3, in no cardio-pulmonary distress. Afebrile. HEENT No facial trauma or asymetry. Sinuses non tender.  Extra occullar muscles intact,. External ears normal, tympanic membranes clear. Oropharynx moist, no exudate. Neck: supple, no adenopathy,JVD or thyromegaly.No bruits.  Chest: Clear to ascultation bilaterally.No crackles or wheezes. Non tender to palpation  Breast: No asymetry,no masses or lumps. No tenderness. No nipple discharge or inversion. No axillary or supraclavicular adenopathy  Cardiovascular system; Heart sounds normal,  S1 and  S2 ,no S3.  No murmur, or thrill. Apical beat not displaced Peripheral pulses normal.  Abdomen: Soft, non tender, no organomegaly or masses. No bruits. Bowel sounds normal. No guarding, tenderness or rebound.  Referred to GI, multiple polyps, and change in BM x 6 to 9 months  GU: External genitalia normal female genitalia , normal female distribution of hair. No lesions. Urethral meatus normal in size, no  Prolapse, no lesions visibly  Present. Bladder non tender. Vagina pink and moist , with no visible lesions , discharge present . Adequate pelvic support no  cystocele or rectocele noted  Uterus absent, no adnexal masses, no  adnexal tenderness.   Musculoskeletal exam: Full ROM of spine, hips , shoulders and knees. No deformity ,swelling or crepitus noted. No muscle wasting or atrophy.   Neurologic: Cranial nerves 2 to  12 intact. Power, tone ,sensation and reflexes normal throughout. No disturbance in gait. No tremor.  Skin: Intact, no ulceration, erythema , scaling or rash noted. Pigmentation normal throughout  Psych;  Mildly depressed l mood and affect. Poor eye contact. Judgement and concentration normal   Assessment & Plan:  Annual physical exam Annual exam as documented. Counseling done  re healthy lifestyle involving commitment to 150 minutes exercise per week, heart healthy diet, and attaining healthy weight.The importance of adequate sleep also discussed. Regular seat belt use and home safety, is also discussed. Changes in health habits are decided on by the patient with goals and time frames  set for achieving them. Immunization and cancer screening needs are specifically addressed at this visit.   Depression No need for medication or therapy, appropriately grieving recent loss of her Mother in Law  Nicotine dependence Asked: confirms currently smoking , due to stress, 10/day Assess; wants to quit,no date set Assist: counselled for 5 mins, and literature provided as well as nicoderm patch prescribed 14 mg and 7 mg x 3 months total Advie: need to quit tomreduce cancer, MI and CVA Arrange : f/u in 3 months  Overweight (BMI 25.0-29.9) Unchanged Patient re-educated about  the importance of commitment to a  minimum of 150 minutes of exercise per week.  The importance of healthy food choices with portion control discussed. Encouraged to start a food diary, count calories and to consider  joining a support group. Sample diet sheets offered. Goals set by the  patient for the next several months.   Weight /BMI 09/14/2018 04/21/2018 03/18/2018  WEIGHT 168 lb 168 lb 168 lb  HEIGHT 5\' 4"  5\' 4"  5\' 4"   BMI 28.84 kg/m2 28.84 kg/m2 28.84 kg/m2      Essential hypertension Uncontrolled, no change in medication, neds to quit smoking, lose weight 5 to 7 pounds and aslso stop processed foods Follow  DASH diet  Commit to regular exercise F/u in 3 months  Internal hemorrhoids with other complication Asymptomatic, however stool is heme positive

## 2018-09-14 NOTE — Assessment & Plan Note (Signed)
No need for medication or therapy, appropriately grieving recent loss of her Mother in [place]

## 2018-09-14 NOTE — Assessment & Plan Note (Signed)

## 2018-09-14 NOTE — Patient Instructions (Addendum)
F/U in 2.5 month, call if you need me sooner  CBC, lipid, cmp and EGFr, TSH, HBA1C, vit D this week please  Shingels vaccines are indicated please consider this   Pap and rectal exam today, and you   Are being referred to Dr Oneida Alar based on symptoms  Nicoderm patch is prescribed ,call  [place], call 24/7 for help with quitting smoking  Blood pressure elevated, stopping smoking will help, to correct this   Steps to Quit Smoking Smoking tobacco can be bad for your health. It can also affect almost every organ in your body. Smoking puts you and people around you at risk for many serious long-lasting (chronic) diseases. Quitting smoking is hard, but it is one of the best things that you can do for your health. It is never too late to quit. What are the benefits of quitting smoking? When you quit smoking, you lower your risk for getting serious diseases and conditions. They can include:  Lung cancer or lung disease.  Heart disease.  Stroke.  Heart attack.  Not being able to have children (infertility).  Weak bones (osteoporosis) and broken bones (fractures).  If you have coughing, wheezing, and shortness of breath, those symptoms may get better when you quit. You may also get sick less often. If you are pregnant, quitting smoking can help to lower your chances of having a baby of low birth weight. What can I do to help me quit smoking? Talk with your doctor about what can help you quit smoking. Some things you can do (strategies) include:  Quitting smoking totally, instead of slowly cutting back how much you smoke over a period of time.  Going to in-person counseling. You are more likely to quit if you go to many counseling sessions.  Using resources and support systems, such as: ? Database administrator with a Social worker. ? Phone quitlines. ? Careers information officer. ? Support groups or group counseling. ? Text messaging programs. ? Mobile phone apps or applications.  Taking  medicines. Some of these medicines may have nicotine in them. If you are pregnant or breastfeeding, do not take any medicines to quit smoking unless your doctor says it is okay. Talk with your doctor about counseling or other things that can help you.  Talk with your doctor about using more than one strategy at the same time, such as taking medicines while you are also going to in-person counseling. This can help make quitting easier. What things can I do to make it easier to quit? Quitting smoking might feel very hard at first, but there is a lot that you can do to make it easier. Take these steps:  Talk to your family and friends. Ask them to support and encourage you.  Call phone quitlines, reach out to support groups, or work with a Social worker.  Ask people who smoke to not smoke around you.  Avoid places that make you want (trigger) to smoke, such as: ? Bars. ? Parties. ? Smoke-break areas at work.  Spend time with people who do not smoke.  Lower the stress in your life. Stress can make you want to smoke. Try these things to help your stress: ? Getting regular exercise. ? Deep-breathing exercises. ? Yoga. ? Meditating. ? Doing a body scan. To do this, close your eyes, focus on one area of your body at a time from head to toe, and notice which parts of your body are tense. Try to relax the muscles in those areas.  Download or buy apps on your mobile phone or tablet that can help you stick to your quit plan. There are many free apps, such as QuitGuide from the [employer] Office manager for Disease Control and Prevention). You can find more support from smokefree.gov and other websites.  This information is not intended to replace advice given to you by your health care provider. Make sure you discuss any questions you have with your health care provider. Document Released: 08/29/2009 Document Revised: 06/30/2016 Document Reviewed: 03/19/2015 Elsevier Interactive Patient Education  2018 [employer].

## 2018-09-14 NOTE — Assessment & Plan Note (Signed)
Asymptomatic, however stool is heme positive

## 2018-09-16 LAB — CYTOLOGY - PAP
ADEQUACY: ABSENT
DIAGNOSIS: NEGATIVE
HPV: NOT DETECTED

## 2018-09-18 ENCOUNTER — Encounter: Payer: Self-pay | Admitting: Family Medicine

## 2018-12-01 ENCOUNTER — Ambulatory Visit: Payer: BLUE CROSS/BLUE SHIELD | Admitting: Family Medicine

## 2018-12-14 ENCOUNTER — Ambulatory Visit: Payer: BLUE CROSS/BLUE SHIELD | Admitting: Nurse Practitioner

## 2019-02-01 ENCOUNTER — Encounter: Payer: Self-pay | Admitting: Family Medicine

## 2019-02-01 ENCOUNTER — Ambulatory Visit (INDEPENDENT_AMBULATORY_CARE_PROVIDER_SITE_OTHER): Payer: BLUE CROSS/BLUE SHIELD | Admitting: Family Medicine

## 2019-02-01 ENCOUNTER — Other Ambulatory Visit: Payer: Self-pay

## 2019-02-01 VITALS — BP 128/82 | HR 67 | Resp 12 | Ht 64.0 in | Wt 172.1 lb

## 2019-02-01 DIAGNOSIS — E663 Overweight: Secondary | ICD-10-CM | POA: Diagnosis not present

## 2019-02-01 DIAGNOSIS — Z1231 Encounter for screening mammogram for malignant neoplasm of breast: Secondary | ICD-10-CM

## 2019-02-01 DIAGNOSIS — I10 Essential (primary) hypertension: Secondary | ICD-10-CM | POA: Diagnosis not present

## 2019-02-01 DIAGNOSIS — F17218 Nicotine dependence, cigarettes, with other nicotine-induced disorders: Secondary | ICD-10-CM

## 2019-02-01 MED ORDER — NICOTINE 14 MG/24HR TD PT24: 14.0000 mg | MEDICATED_PATCH | Freq: Every day | TRANSDERMAL | 1 refills | Status: DC

## 2019-02-01 MED ORDER — NICOTINE 7 MG/24HR TD PT24: 7.0000 mg | MEDICATED_PATCH | Freq: Every day | TRANSDERMAL | 1 refills | Status: DC

## 2019-02-01 NOTE — Patient Instructions (Signed)
Physical exam with MD early November , call if t you need me before  Please schedule mammogram for pt  I will message you on labs   It is important that you exercise regularly at least 30 minutes 5 times a week. If you develop chest pain, have severe difficulty breathing, or feel very tired, stop exercising immediately and seek medical attention    Think about what you will eat, plan ahead.  Need to STOP sodas Choose " clean, green, fresh or frozen" over canned, processed or packaged foods which are more sugary, salty and fatty. 70 to 75% of food eaten should be vegetables and fruit. Three meals at set times with snacks allowed between meals, but they must be fruit or vegetables. Aim to eat over a 12 hour period , example 7 am to 7 pm, and STOP after  your last meal of the day. Drink water,generally about 64 ounces per day, no other drink is as healthy. Fruit juice is best enjoyed in a healthy way, by EATING the fruit.

## 2019-02-01 NOTE — Assessment & Plan Note (Signed)
Deteriorated.  Patient re-educated about  the importance of commitment to a  minimum of 150 minutes of exercise per week as able.  The importance of healthy food choices with portion control discussed, as well as eating regularly and within a 12 hour window most days. The need to choose "clean , green" food 50 to 75% of the time is discussed, as well as to make water the primary drink and set a goal of 64 ounces water daily.  Encouraged to start a food diary,  and to consider  joining a support group. Sample diet sheets offered. Goals set by the patient for the next several months.   Weight /BMI 02/01/2019 09/14/2018 04/21/2018  WEIGHT 172 lb 1.9 oz 168 lb 168 lb  HEIGHT 5\' 4"  5\' 4"  5\' 4"   BMI 29.54 kg/m2 28.84 kg/m2 28.84 kg/m2

## 2019-02-01 NOTE — Assessment & Plan Note (Signed)
Controlled, no change in medication DASH diet and commitment to daily physical activity for a minimum of 30 minutes discussed and encouraged, as a part of hypertension management. The importance of attaining a healthy weight is also discussed.  BP/Weight 02/01/2019 09/14/2018 04/21/2018 03/18/2018 01/04/2018 11/65/7903 06/18/3382  Systolic BP 291 916 606 004 599 774 142  Diastolic BP 82 88 88 80 84 90 80  Wt. (Lbs) 172.12 168 168 168 173.04 180.5 186.04  BMI 29.54 28.84 28.84 28.84 27.93 29.13 30.03

## 2019-02-01 NOTE — Progress Notes (Signed)
Lydia Perez     MRN: [id number]      DOB: 08-08-1956   HPI Lydia Perez is here for follow up and re-evaluation of chronic medical conditions, medication management and review of any available recent lab and radiology data.  Preventive health is updated, specifically  Cancer screening and Immunization.   Questions or concerns regarding consultations or procedures which the PT has had in the interim are  addressed. The PT denies any adverse reactions to current medications since the last visit.  Wants to quit smoking , now at 10/day, quit date set for  July birthday,   ROS Denies recent fever or chills. Denies sinus pressure, nasal congestion, ear pain or sore throat. Denies chest congestion, productive cough or wheezing. Denies chest pains, palpitations and leg swelling Denies abdominal pain, nausea, vomiting,diarrhea or constipation.   Denies dysuria, frequency, hesitancy or incontinence. Denies joint pain, swelling and limitation in mobility. Denies headaches, seizures, numbness, or tingling. Denies depression, anxiety or insomnia. Denies skin break down or rash.   PE  BP 128/82   Pulse 67   Resp 12   Ht 5\' 4"  (1.626 m)   Wt 172 lb 1.9 oz (78.1 kg)   SpO2 97% Comment: room air  BMI 29.54 kg/m   Patient alert and oriented and in no cardiopulmonary distress.  HEENT: No facial asymmetry, EOMI,   oropharynx pink and moist.  Neck supple no JVD, no mass.  Chest: Clear to auscultation bilaterally.  CVS: S1, S2 no murmurs, no S3.Regular rate.  ABD: Soft non tender.   Ext: No edema  MS: Adequate ROM spine, shoulders, hips and knees.  Skin: Intact, no ulcerations or rash noted.  Psych: Good eye contact, normal affect. Memory intact not anxious or depressed appearing.  CNS: CN 2-12 intact, power,  normal throughout.no focal deficits noted.   Assessment & Plan  Essential hypertension Controlled, no change in medication DASH diet and commitment to daily  physical activity for a minimum of 30 minutes discussed and encouraged, as a part of hypertension management. The importance of attaining a healthy weight is also discussed.  BP/Weight 02/01/2019 09/14/2018 04/21/2018 03/18/2018 01/04/2018 09/62/8366 12/26/4763  Systolic BP 465 035 465 681 275 170 017  Diastolic BP 82 88 88 80 84 90 80  Wt. (Lbs) 172.12 168 168 168 173.04 180.5 186.04  BMI 29.54 28.84 28.84 28.84 27.93 29.13 30.03       Nicotine dependence Asked:confirms currently smokes 10 cigarettes/ day Assess: willing to quit and  cutting back, wants to start the nicoderm patch in am, quit date is set for her birthday Advise: needs to QUIT to reduce risk of cancer, cardio and cerebrovascular disease Assist: counseled for 5 minutes and literature provided Arrange: follow up in 3 months   Overweight (BMI 25.0-29.9) Deteriorated.  Patient re-educated about  the importance of commitment to a  minimum of 150 minutes of exercise per week as able.  The importance of healthy food choices with portion control discussed, as well as eating regularly and within a 12 hour window most days. The need to choose "clean , green" food 50 to 75% of the time is discussed, as well as to make water the primary drink and set a goal of 64 ounces water daily.  Encouraged to start a food diary,  and to consider  joining a support group. Sample diet sheets offered. Goals set by the patient for the next several months.   Weight /BMI 02/01/2019 09/14/2018 04/21/2018  WEIGHT  172 lb 1.9 oz 168 lb 168 lb  HEIGHT 5\' 4"  5\' 4"  5\' 4"   BMI 29.54 kg/m2 28.84 kg/m2 28.84 kg/m2

## 2019-02-01 NOTE — Assessment & Plan Note (Signed)
Asked:confirms currently smokes 10 cigarettes/ day Assess: willing to quit and  cutting back, wants to start the nicoderm patch in am, quit date is set for her birthday Advise: needs to QUIT to reduce risk of cancer, cardio and cerebrovascular disease Assist: counseled for 5 minutes and literature provided Arrange: follow up in 3 months

## 2019-03-08 ENCOUNTER — Encounter: Payer: Self-pay | Admitting: Nurse Practitioner

## 2019-03-08 ENCOUNTER — Other Ambulatory Visit: Payer: Self-pay

## 2019-03-08 ENCOUNTER — Ambulatory Visit (INDEPENDENT_AMBULATORY_CARE_PROVIDER_SITE_OTHER): Payer: BLUE CROSS/BLUE SHIELD | Admitting: Nurse Practitioner

## 2019-03-08 DIAGNOSIS — K5900 Constipation, unspecified: Secondary | ICD-10-CM

## 2019-03-08 NOTE — Assessment & Plan Note (Signed)
The patient describes relatively new onset of constipation which she attributes to age.  She will at times go 3 to 4 days with no bowel movement which resulted in abdominal pain that resolves after a bowel movement.  However, if she eats chocolate this tends to function as a laxative to her but overshoots and causes "accidents".  I recommended she avoid chocolate to prevent fecal incontinence.  For constipation, try Colace stool softener once a day.  She can add MiraLAX 1-2 times a day as needed.  Call with any worsening symptoms or concerns such as rectal bleeding.  Follow-up in 3 months.  If her symptoms do not improve with the above measures we can consider prescriptive management such as Linzess 72 mcg daily.

## 2019-03-08 NOTE — Patient Instructions (Signed)
Your health issues we discussed today were:   Constipation: 1. As we discussed, avoid chocolate to prevent "accidents" 2. For constipation that sometimes results in abdominal pain, start taking Colace over-the-counter stool softener once a day 3. You can also add MiraLAX 1-2 times a day, as needed, if needed, for breakthrough constipation 4. Call us if you have any worsening symptoms or for severe symptoms 5. Call us if you see any rectal bleeding  Overall I recommend:  1. Continue your other current medications 2. Call us if you have any questions or concerns 3. Follow-up in 3 months   Because of recent events of COVID-19 ("Coronavirus"), follow CDC recommendations:  1. Wash your hand frequently 2. Avoid touching your face 3. Stay away from people who are sick 4. If you have symptoms such as fever, cough, shortness of breath then call your healthcare provider for further guidance 5. If you are sick, STAY AT HOME unless otherwise directed by your healthcare provider. 6. Follow directions from state and national officials regarding staying safe   At [facility] we value your feedback. You may receive a survey about your visit today. Please share your experience as we strive to create trusting relationships with our patients to provide genuine, compassionate, quality care.  We appreciate your understanding and patience as we review any laboratory studies, imaging, and other diagnostic tests that are ordered as we care for you. Our office policy is 5 business days for review of these results, and any emergent or urgent results are addressed in a timely manner for your best interest. If you do not hear from our office in 1 week, please contact us.   We also encourage the use of MyChart, which contains your medical information for your review as well. If you are not enrolled in this feature, an access code is on this after visit summary for your convenience. Thank you for  allowing [place] to be involved in your care.  It was great to see you today!  I hope you have a great day!!

## 2019-03-08 NOTE — Progress Notes (Signed)
Referring Provider: Fayrene Helper, MD Primary Care Physician:  Fayrene Helper, MD Primary GI:  Dr. Oneida Alar  NOTE: Service was provided via telemedicine and was requested by the patient due to COVID-19 pandemic.  Method of visit: FaceTime  Patient Location: Home  Provider Location: Office  Reason for Phone Visit: Evaluation of stool changes and heme positive stool.  The patient was consented to phone follow-up via telephone encounter including billing of the encounter (yes/no): Yes  Persons present on the phone encounter, with roles: None  Total time (minutes) spent on medical discussion: 14 minutes  Chief Complaint  Patient presents with  . Constipation    Chocolate works like a laxative for her    HPI:   Lydia Perez is a 63 y.o. female who presents for virtual visit regarding: Evaluation of stool changes and heme positive stool.  We have not seen the patient in our office since 2014 when she was seen for internal hemorrhoids, H. pylori gastritis, dysphagia.  At that time noted no problems after colonoscopy and banding.  She was treated with Pylera for H. pylori.  Recommended avoid constipation and straining, drink water and increase fiber, follow-up in 6 months.  Also recommended add probiotic for 3 months after H. pylori treatment.  11-month follow-up did not occur.  Last colonoscopy 05/15/2013 which found a total of 6 colorectal polyps, rectal bleeding due to moderate size internal hemorrhoids with 3 bands applied.  Surgical pathology found the polyps to be tubular adenoma. Recommended continue Nexium, follow low residue diet, avoid triggers, follow-up in 2 to 3 weeks, next colonoscopy in 10 years (2024).  Today she states she's doing well overall. She is working currently, works at [employer] (3rd shift). She is currently having intermittent constipation; will need to go, but be unable. If she eats chocolate, it will act like a laxative for her and she will  have a bowel movement without control. If she doesn't eat chocolate will have a bowel movement every 3-4 days and occasional associated abdominal pain; pain resolves after a bowel movement. Denies hematochezia, melena. Constipation is new for her as of the past couple months. Denies N/V, fever, chills, unintentional weight loss. Denies URI and flu-like symptoms. Denies chest pain, dyspnea, dizziness, lightheadedness, syncope, near syncope. Denies any other upper or lower GI symptoms.  Past Medical History:  Diagnosis Date  . Anemia    NOS   . Bipolar disorder ([place]) 2010   dx by PCP   . Esophageal stricture   . Hiatal hernia   . History of cardiac cath 2002    Past Surgical History:  Procedure Laterality Date  . COLONOSCOPY  08/13/2008   SLF: SIMPLE ADENOMA/HYPERPLASTIC POLYP  . COLONOSCOPY N/A 05/15/2013   SLF:SIX COLORECTALPOLYPS REMOVED./RECTAL BLEEDING DUE TO Moderate sized internal hemorrhoids  . ESOPHAGOGASTRODUODENOSCOPY  07/19/2007   SLF: Normal  . ESOPHAGOGASTRODUODENOSCOPY (EGD) WITH ESOPHAGEAL DILATION N/A 05/15/2013   JIR:CVEL Non-erosive gastritis  . HEMORRHOID BANDING N/A 05/15/2013   Procedure: HEMORRHOID BANDING;  Surgeon: Danie Binder, MD;  Location: [facility];  Service: Endoscopy;  Laterality: N/A;  . TOTAL ABDOMINAL HYSTERECTOMY W/ BILATERAL SALPINGOOPHORECTOMY  01/2005  . TUBAL LIGATION      Current Outpatient Medications  Medication Sig Dispense Refill  . amLODipine (NORVASC) 5 MG tablet Take 1 tablet (5 mg total) by mouth daily. 90 tablet 3   No current facility-administered medications for this visit.     Allergies as of 03/08/2019 - Review Complete  03/08/2019  Allergen Reaction Noted  . Codeine Other (See Comments) 12/21/2006    Family History  Problem Relation Age of Onset  . Emphysema Father        was a smoker  . Lung cancer Father        was a smoker  . Kidney disease Mother   . Heart disease Mother        1st by pass in her 20's   .  Heart failure Mother   . Diabetes Mother   . Hypertension Mother   . Heart attack Brother   . Heart failure Brother   . Colon cancer Neg Hx   . Colon polyps Neg Hx     Social History   Socioeconomic History  . Marital status: Married    Spouse name: Not on file  . Number of children: 3  . Years of education: Not on file  . Highest education level: Not on file  Occupational History  . Occupation: Engineer, petroleum: [employer]  Social Needs  . Financial resource strain: Not on file  . Food insecurity:    Worry: Not on file    Inability: Not on file  . Transportation needs:    Medical: Not on file    Non-medical: Not on file  Tobacco Use  . Smoking status: Current Every Day Smoker    Packs/day: 0.50    Years: 40.00    Pack years: 20.00    Types: Cigarettes    Start date: 02/17/1973  . Smokeless tobacco: Never Used  . Tobacco comment: currently 1/2 PPD as of 03/08/19; quit for a few years at one point.  Substance and Sexual Activity  . Alcohol use: No    Alcohol/week: 0.0 standard drinks  . Drug use: No    Comment: marijuana   . Sexual activity: Yes    Birth control/protection: Surgical  Lifestyle  . Physical activity:    Days per week: Not on file    Minutes per session: Not on file  . Stress: Not on file  Relationships  . Social connections:    Talks on phone: Not on file    Gets together: Not on file    Attends religious service: Not on file    Active member of club or organization: Not on file    Attends meetings of clubs or organizations: Not on file    Relationship status: Not on file  Other Topics Concern  . Not on file  Social History Narrative  . Not on file    Review of Systems: Complete ROS negative except as per HPI.  Physical Exam: Note: limited exam due to virtual visit General:   Alert and oriented. Pleasant and cooperative. Well-nourished and well-developed.  Head:  Normocephalic and atraumatic. Eyes:  Without icterus,  sclera clear and conjunctiva pink.  Ears:  Normal auditory acuity. Skin:  Intact without facial significant lesions or rashes. Neurologic:  Alert and oriented x4;  grossly normal neurologically. Psych:  Alert and cooperative. Normal mood and affect. Heme/Lymph/Immune: No excessive bruising noted.

## 2019-03-09 ENCOUNTER — Encounter: Payer: Self-pay | Admitting: Gastroenterology

## 2019-03-09 NOTE — Progress Notes (Signed)
cc'ed to pcp °

## 2019-04-28 ENCOUNTER — Other Ambulatory Visit: Payer: Self-pay

## 2019-04-28 ENCOUNTER — Ambulatory Visit (HOSPITAL_COMMUNITY)
Admission: RE | Admit: 2019-04-28 | Discharge: 2019-04-28 | Disposition: A | Discharge status: Home / Self Care | Payer: BC Managed Care – PPO | Source: Ambulatory Visit | Attending: Family Medicine | Admitting: Family Medicine

## 2019-04-28 DIAGNOSIS — Z1231 Encounter for screening mammogram for malignant neoplasm of breast: Secondary | ICD-10-CM | POA: Diagnosis not present

## 2019-05-28 IMAGING — MG DIGITAL SCREENING BILATERAL MAMMOGRAM WITH TOMO AND CAD
6 of 10 series · 6 of 26 positions shown · non-contrast
Comparison: Previous exam(s).

CLINICAL DATA: Screening.

EXAM:
DIGITAL SCREENING BILATERAL MAMMOGRAM WITH TOMO AND CAD

[L CC (1 of 2)]
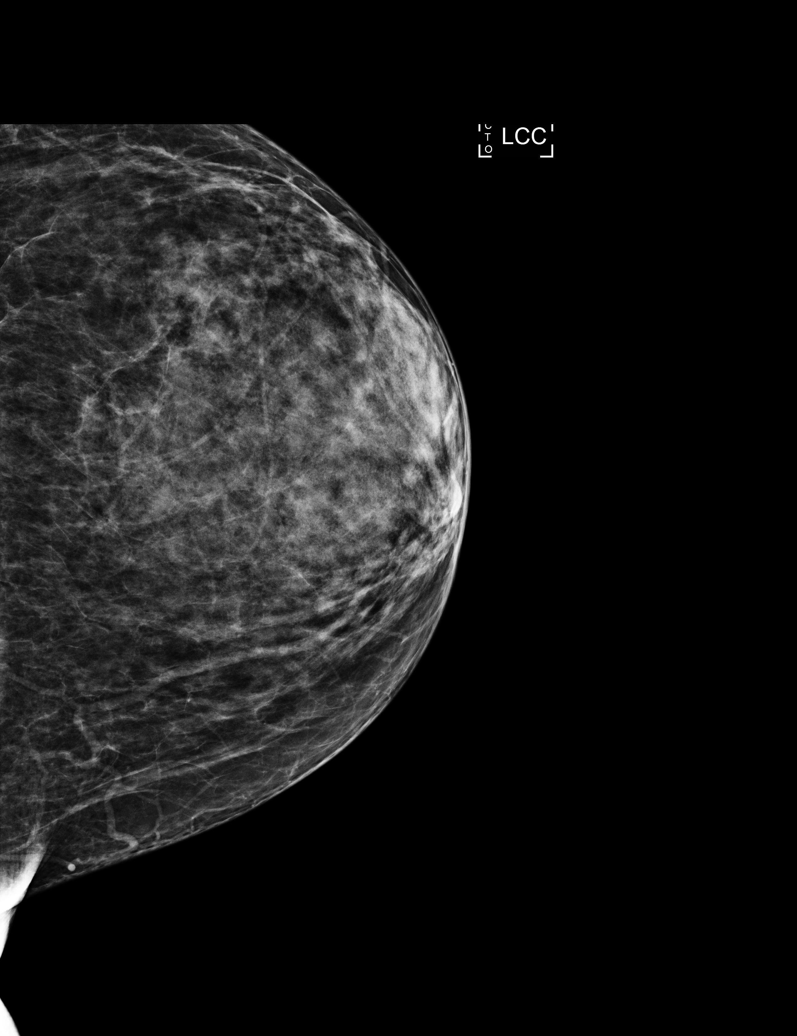

[L MLO (1 of 2)]
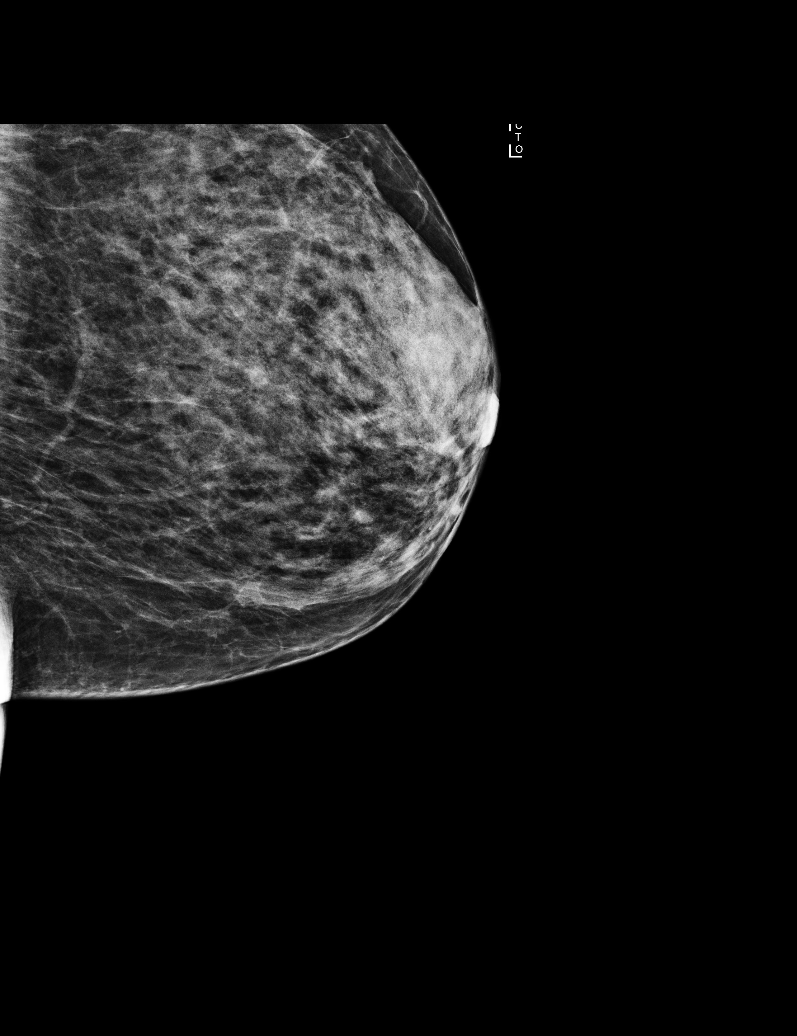

[R CC]
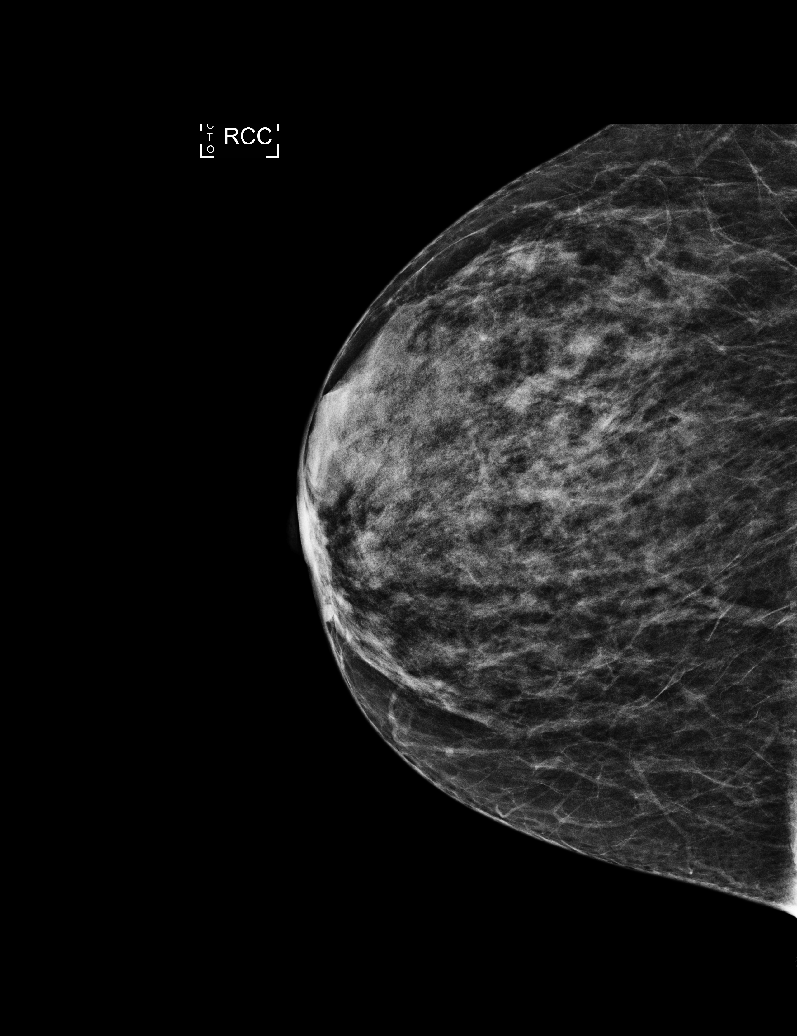

[L MLO (2 of 2)]
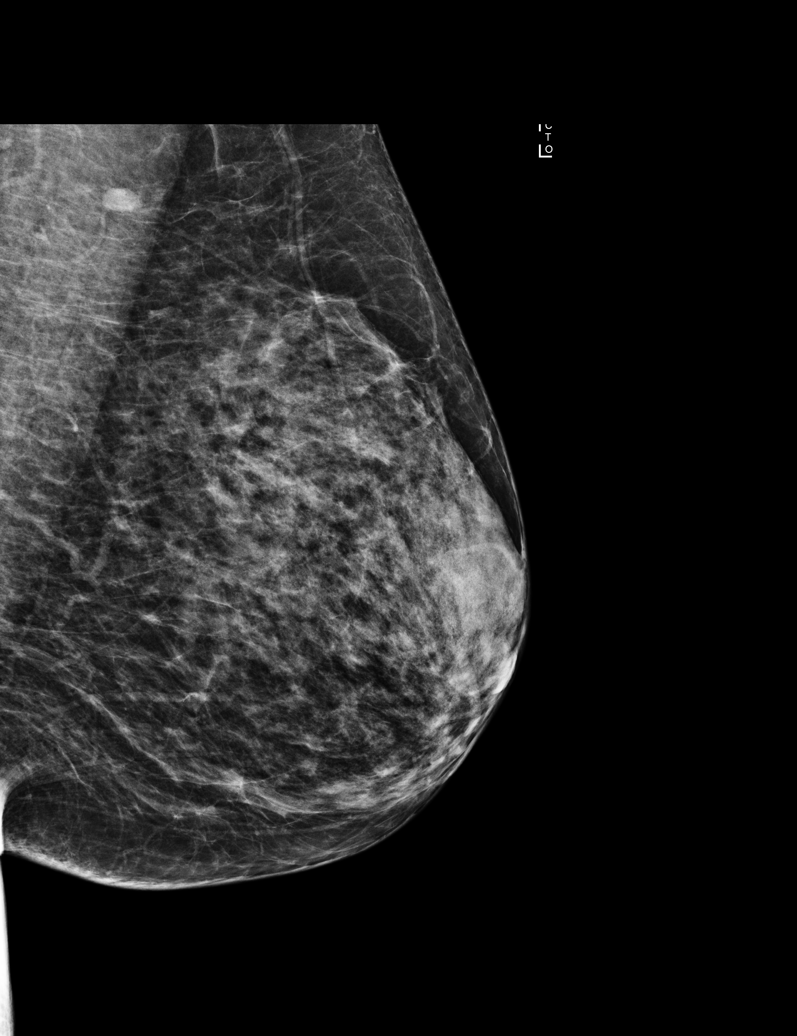

[R MLO]
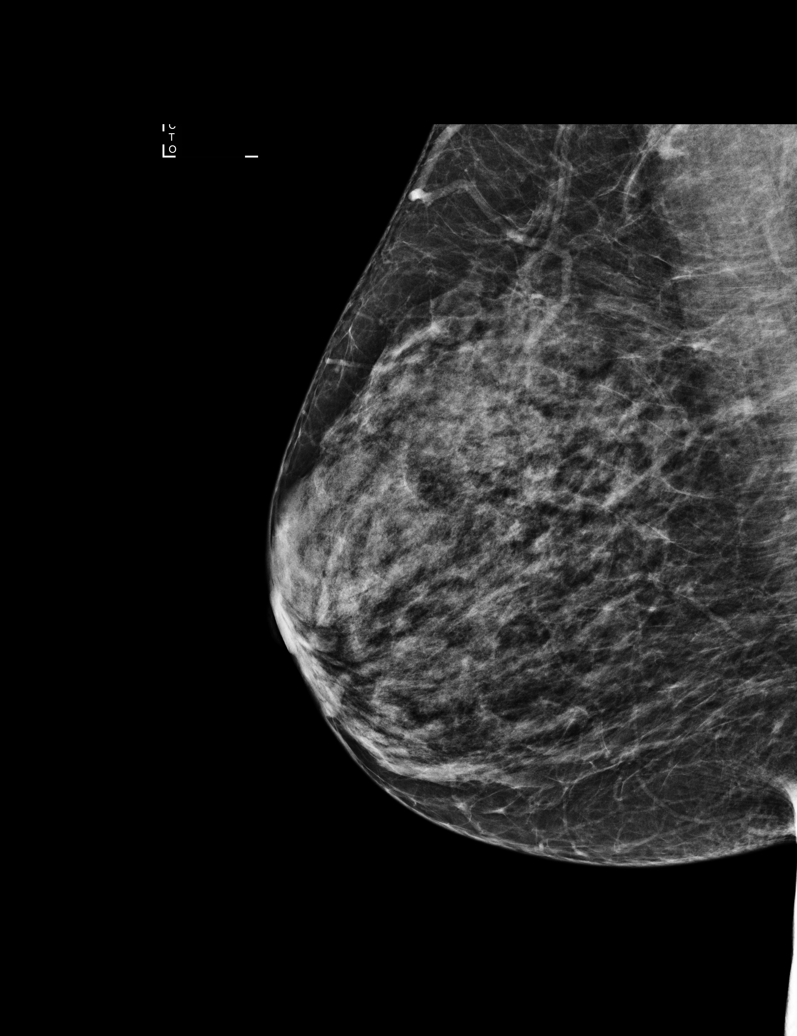

[L CC (2 of 2)]
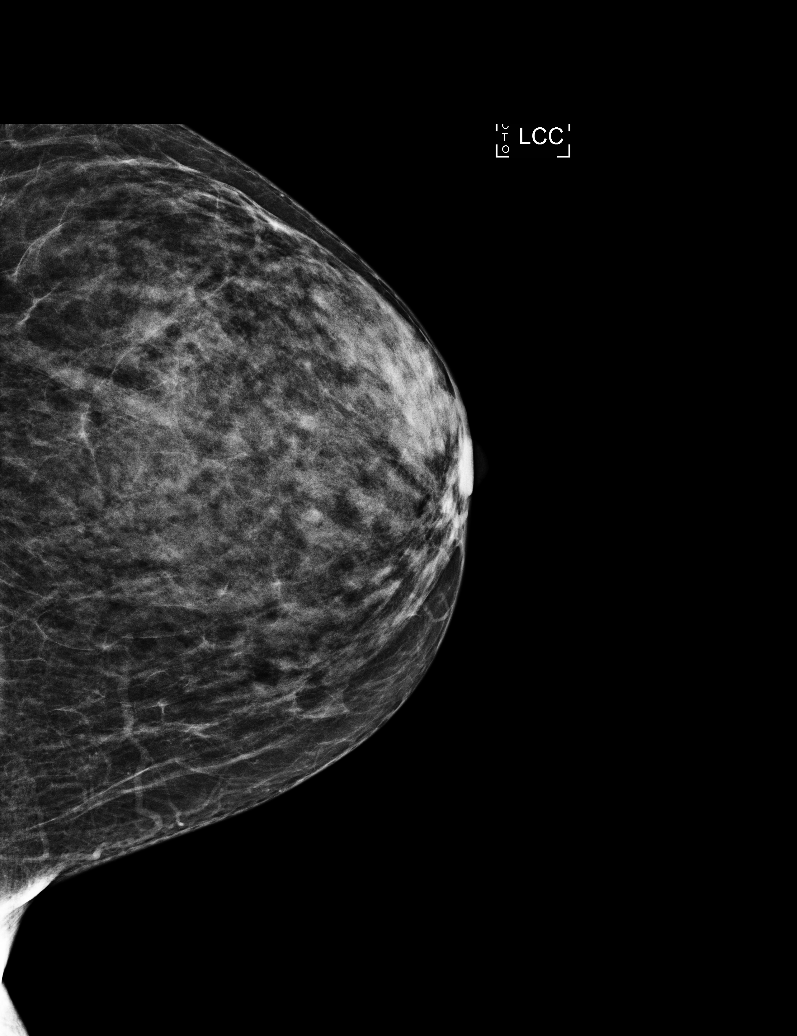

[6 of 26 positions shown; findings below may reference images not displayed]

ACR Breast Density Category c: The breast tissue is heterogeneously
dense, which may obscure small masses.
FINDINGS: There are no findings suspicious for malignancy. Images were
processed with CAD.
IMPRESSION: No mammographic evidence of malignancy. A result letter of this
screening mammogram will be mailed directly to the patient.

RECOMMENDATION:
Screening mammogram in one year. (Code:[id number])

BI-RADS CATEGORY  1: Negative.

## 2019-06-08 ENCOUNTER — Ambulatory Visit: Payer: BLUE CROSS/BLUE SHIELD | Admitting: Nurse Practitioner

## 2019-06-21 ENCOUNTER — Other Ambulatory Visit: Payer: Self-pay

## 2019-06-21 ENCOUNTER — Telehealth: Payer: Self-pay | Admitting: *Deleted

## 2019-06-21 MED ORDER — AMLODIPINE BESYLATE 5 MG PO TABS: 5.0000 mg | ORAL_TABLET | Freq: Every day | ORAL | 3 refills | Status: DC

## 2019-06-21 NOTE — Telephone Encounter (Signed)
Pt called said she needed a refill on amlodopine sent to [place] she only has 2 left and she needs a 90 day supply sent in .

## 2019-06-21 NOTE — Telephone Encounter (Signed)
Refill sent in

## 2019-08-22 ENCOUNTER — Ambulatory Visit (INDEPENDENT_AMBULATORY_CARE_PROVIDER_SITE_OTHER): Payer: BC Managed Care – PPO | Admitting: Family Medicine

## 2019-08-22 ENCOUNTER — Other Ambulatory Visit: Payer: Self-pay

## 2019-08-22 ENCOUNTER — Encounter: Payer: Self-pay | Admitting: Family Medicine

## 2019-08-22 VITALS — BP 120/70 | HR 72 | Temp 97.6°F | Resp 15 | Ht 64.0 in | Wt 180.1 lb

## 2019-08-22 DIAGNOSIS — E669 Obesity, unspecified: Secondary | ICD-10-CM | POA: Diagnosis not present

## 2019-08-22 DIAGNOSIS — W57XXXA Bitten or stung by nonvenomous insect and other nonvenomous arthropods, initial encounter: Secondary | ICD-10-CM

## 2019-08-22 DIAGNOSIS — S20162A Insect bite (nonvenomous) of breast, left breast, initial encounter: Secondary | ICD-10-CM

## 2019-08-22 MED ORDER — SULFAMETHOXAZOLE-TRIMETHOPRIM 800-160 MG PO TABS: 1.0000 | ORAL_TABLET | Freq: Two times a day (BID) | ORAL | 0 refills | Status: AC

## 2019-08-22 NOTE — Patient Instructions (Signed)
    I appreciate the opportunity to provide you with the care for your health and wellness. Today we discussed: insect bite/skin infection  Follow up: 09/25/2019  No labs or referrals today  Please pick up your antibiotic for the start of the infection on left  Breast. Take as directed and complete the full course to prevent reinfection.   Please continue to practice social distancing to keep you, your family, and our community safe.  If you must go out, please wear a mask and practice good handwashing.  [place] YOUR HANDS WELL AND FREQUENTLY. AVOID TOUCHING YOUR FACE, UNLESS YOUR HANDS ARE FRESHLY WASHED.  GET FRESH AIR DAILY. STAY HYDRATED WITH WATER.   It was a pleasure to see you and I look forward to continuing to work together on your health and well-being. Please do not hesitate to call the office if you need care or have questions about your care.  Have a wonderful day and week. With Gratitude, Cherly Beach, DNP, AGNP-BC

## 2019-08-22 NOTE — Progress Notes (Signed)
Subjective:     Patient ID: Lydia Perez, female   DOB: 12-03-1955, 63 y.o.   MRN: [id number]  [name] presents for knot in LEFT breast (found it last night. painful)  Ms. Cante has a history of anemia, bipolar disorder, esophageal stricture, hiatal hernia among others.  Presented today with a spot on her left breast that she found last night.  Denies itching denies burning.  Denies having any visible insects that she saw could have bitten her.  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  Reports she had no problems prior to this.  No discharge from the nipple.  No other skin changes in the breast.  Had recent mammogram back in June of this year which was negative for any malignancy.  She has no cough no fevers no chills or any other signs or symptoms of infection.  Today patient denies signs and symptoms of COVID 19 infection including fever, chills, cough, shortness of breath, and headache.  Past Medical, Surgical, Social History, Allergies, and Medications have been Reviewed.   Past Medical History:  Diagnosis Date  . Anemia    NOS   . Bipolar disorder ([place]) 2010   dx by PCP   . Esophageal stricture   . Hiatal hernia   . History of cardiac cath 2002   Past Surgical History:  Procedure Laterality Date  . COLONOSCOPY  08/13/2008   SLF: SIMPLE ADENOMA/HYPERPLASTIC POLYP  . COLONOSCOPY N/A 05/15/2013   SLF:SIX COLORECTALPOLYPS REMOVED./RECTAL BLEEDING DUE TO Moderate sized internal hemorrhoids  . ESOPHAGOGASTRODUODENOSCOPY  07/19/2007   SLF: Normal  . ESOPHAGOGASTRODUODENOSCOPY (EGD) WITH ESOPHAGEAL DILATION N/A 05/15/2013   [id number] Non-erosive gastritis  . HEMORRHOID BANDING N/A 05/15/2013   Procedure: HEMORRHOID BANDING;  Surgeon: Danie Binder, MD;  Location: [facility];  Service: Endoscopy;  Laterality: N/A;  . TOTAL ABDOMINAL HYSTERECTOMY W/ BILATERAL SALPINGOOPHORECTOMY  01/2005  . TUBAL LIGATION     Social History   Socioeconomic History  . Marital  status: Married    Spouse name: Not on file  . Number of children: 3  . Years of education: Not on file  . Highest education level: Not on file  Occupational History  . Occupation: Engineer, petroleum: [employer]  Social Needs  . Financial resource strain: Not on file  . Food insecurity    Worry: Not on file    Inability: Not on file  . Transportation needs    Medical: Not on file    Non-medical: Not on file  Tobacco Use  . Smoking status: Current Every Day Smoker    Packs/day: 0.50    Years: 40.00    Pack years: 20.00    Types: Cigarettes    Start date: 02/17/1973  . Smokeless tobacco: Never Used  . Tobacco comment: currently 1/2 PPD as of 03/08/19; quit for a few years at one point.  Substance and Sexual Activity  . Alcohol use: No    Alcohol/week: 0.0 standard drinks  . Drug use: No    Comment: marijuana   . Sexual activity: Yes    Birth control/protection: Surgical  Lifestyle  . Physical activity    Days per week: Not on file    Minutes per session: Not on file  . Stress: Not on file  Relationships  . Social Herbalist on phone: Not on file    Gets together: Not on file    Attends religious service: Not on  file    Active member of club or organization: Not on file    Attends meetings of clubs or organizations: Not on file    Relationship status: Not on file  . Intimate partner violence    Fear of current or ex partner: Not on file    Emotionally abused: Not on file    Physically abused: Not on file    Forced sexual activity: Not on file  Other Topics Concern  . Not on file  Social History Narrative  . Not on file    Outpatient Encounter Medications as of 08/22/2019  Medication Sig  . amLODipine (NORVASC) 5 MG tablet Take 1 tablet (5 mg total) by mouth daily.  Marland Kitchen sulfamethoxazole-trimethoprim (BACTRIM DS) 800-160 MG tablet Take 1 tablet by mouth 2 (two) times daily for 5 days.   No facility-administered encounter medications on file as  of 08/22/2019.    Allergies  Allergen Reactions  . Codeine Other (See Comments)    Hallucinations    Review of Systems  HENT: Negative.   Eyes: Negative.   Respiratory: Negative.   Cardiovascular: Negative.   Gastrointestinal: Negative.   Endocrine: Negative.   Genitourinary: Negative.   Musculoskeletal: Negative.   Skin: Negative.        Spot on left breast  Allergic/Immunologic: Negative.   Neurological: Negative.   Hematological: Negative.   Psychiatric/Behavioral: Negative.   All other systems reviewed and are negative.      Objective:     BP 120/70   Pulse 72   Temp 97.6 F (36.4 C) (Oral)   Resp 15   Ht 5\' 4"  (1.626 m)   Wt 180 lb 1.9 oz (81.7 kg)   SpO2 98%   BMI 30.92 kg/m   Physical Exam Vitals signs and nursing note reviewed.  Constitutional:      Appearance: Normal appearance. She is well-developed and well-groomed. She is obese.  HENT:     Head: Normocephalic and atraumatic.     Right Ear: External ear normal.     Left Ear: External ear normal.     Nose: Nose normal.     Mouth/Throat:     Mouth: Mucous membranes are moist.     Pharynx: Oropharynx is clear.  Eyes:     General:        Right eye: No discharge.        Left eye: No discharge.     Conjunctiva/sclera: Conjunctivae normal.  Neck:     Musculoskeletal: Normal range of motion and neck supple.  Cardiovascular:     Rate and Rhythm: Normal rate and regular rhythm.     Pulses: Normal pulses.     Heart sounds: Normal heart sounds.  Pulmonary:     Effort: Pulmonary effort is normal.     Breath sounds: Normal breath sounds.  Chest:     Breasts:        Right: Normal.        Left: Tenderness present.     Comments: 1 inch x 1 inch tender red site, small bump noted -consistent with possible insect bite. Location: 11 o clock right at areola line Musculoskeletal: Normal range of motion.  Skin:    General: Skin is warm.  Neurological:     General: No focal deficit present.     Mental  Status: She is alert and oriented to person, place, and time.  Psychiatric:        Attention and Perception: Attention normal.  Mood and Affect: Mood normal.        Speech: Speech normal.        Behavior: Behavior normal. Behavior is cooperative.        Thought Content: Thought content normal.        Cognition and Memory: Cognition normal.        Judgment: Judgment normal.        Assessment and Plan      1. Insect bite of left breast, initial encounter Provided with coverage for skin infection from possible insect bite. Wears uniforms at work, might be from that she reports. Educated on S&S to watch for in case of return to clinic need.  Additionally, recent mammogram in 04/2019 was negative, do not feel this is malignant.  Reviewed side effects, risks and benefits of medication.   Patient acknowledged agreement and understanding of the plan.   - sulfamethoxazole-trimethoprim (BACTRIM DS) 800-160 MG tablet; Take 1 tablet by mouth 2 (two) times daily for 5 days.  Dispense: 10 tablet; Refill: 0  2. Obesity (BMI 30-39.9)  HARJOT GOVEA is re-educated about the importance of exercise daily to help with weight management. A minumum of 30 minutes daily is recommended.   Wt Readings from Last 3 Encounters:  08/22/19 180 lb 1.9 oz (81.7 kg)  02/01/19 172 lb 1.9 oz (78.1 kg)  09/14/18 168 lb (76.2 kg)    Follow up: 09/25/2019  Perlie Mayo, DNP, AGNP-BC [address], [address], [place] [address] Office Hours: Mon-Thurs 8 am-5 pm; Fri 8 am-12 pm Office Phone:  [phone]  Office Fax: [phone]

## 2019-09-25 ENCOUNTER — Other Ambulatory Visit: Payer: Self-pay

## 2019-09-25 ENCOUNTER — Ambulatory Visit (INDEPENDENT_AMBULATORY_CARE_PROVIDER_SITE_OTHER): Payer: BC Managed Care – PPO | Admitting: Family Medicine

## 2019-09-25 ENCOUNTER — Encounter: Payer: Self-pay | Admitting: Family Medicine

## 2019-09-25 VITALS — BP 120/84 | HR 81 | Temp 97.3°F | Resp 15 | Ht 64.0 in | Wt 182.0 lb

## 2019-09-25 DIAGNOSIS — Z0000 Encounter for general adult medical examination without abnormal findings: Secondary | ICD-10-CM

## 2019-09-25 DIAGNOSIS — R7301 Impaired fasting glucose: Secondary | ICD-10-CM

## 2019-09-25 DIAGNOSIS — H547 Unspecified visual loss: Secondary | ICD-10-CM

## 2019-09-25 DIAGNOSIS — E559 Vitamin D deficiency, unspecified: Secondary | ICD-10-CM

## 2019-09-25 DIAGNOSIS — F17210 Nicotine dependence, cigarettes, uncomplicated: Secondary | ICD-10-CM | POA: Diagnosis not present

## 2019-09-25 DIAGNOSIS — F419 Anxiety disorder, unspecified: Secondary | ICD-10-CM | POA: Diagnosis not present

## 2019-09-25 DIAGNOSIS — E785 Hyperlipidemia, unspecified: Secondary | ICD-10-CM

## 2019-09-25 DIAGNOSIS — I10 Essential (primary) hypertension: Secondary | ICD-10-CM

## 2019-09-25 DIAGNOSIS — F17218 Nicotine dependence, cigarettes, with other nicotine-induced disorders: Secondary | ICD-10-CM

## 2019-09-25 MED ORDER — BUPROPION HCL ER (SMOKING DET) 150 MG PO TB12: ORAL_TABLET | ORAL | 3 refills | Status: DC

## 2019-09-25 MED ORDER — BUSPIRONE HCL 5 MG PO TABS: 5.0000 mg | ORAL_TABLET | Freq: Two times a day (BID) | ORAL | 3 refills | Status: DC

## 2019-09-25 NOTE — Assessment & Plan Note (Addendum)
Start twice daily buspar as this  has worsened

## 2019-09-25 NOTE — Patient Instructions (Addendum)
F/u 2nd week in January, call, if you need me before  CONGRATS on deciding to stop smoking  you currently smoke 1 PPD  Medication is prescribed for anxiety, buspar , one twice daily  Medication is prescribed for help with stopping smoking  Wellbutrin.  Please set a quit date for October 16, 2020.  Fasting lab  needed this week, CBC lipid panel CMP and EGFR TSH hemoglobin A1c and vitamin D level.  Please schedule appointment for eye exam as her vision is extremely poor in the left eye.      Steps to Quit Smoking Smoking tobacco is the leading cause of preventable death. It can affect almost every organ in the body. Smoking puts you and people around you at risk for many serious, long-lasting (chronic) diseases. Quitting smoking can be hard, but it is one of the best things that you can do for your health. It is never too late to quit. How do I get ready to quit? When you decide to quit smoking, make a plan to help you succeed. Before you quit:  Pick a date to quit. Set a date within the next 2 weeks to give you time to prepare.  Write down the reasons why you are quitting. Keep this list in places where you will see it often.  Tell your family, friends, and co-workers that you are quitting. Their support is important.  Talk with your doctor about the choices that may help you quit.  Find out if your health insurance will pay for these treatments.  Know the people, places, things, and activities that make you want to smoke (triggers). Avoid them. What first steps can I take to quit smoking?  Throw away all cigarettes at home, at work, and in your car.  Throw away the things that you use when you smoke, such as ashtrays and lighters.  Clean your car. Make sure to empty the ashtray.  Clean your home, including curtains and carpets. What can I do to help me quit smoking? Talk with your doctor about taking medicines and seeing a counselor at the same time. You are more likely to  succeed when you do both.  If you are pregnant or breastfeeding, talk with your doctor about counseling or other ways to quit smoking. Do not take medicine to help you quit smoking unless your doctor tells you to do so. To quit smoking: Quit right away  Quit smoking totally, instead of slowly cutting back on how much you smoke over a period of time.  Go to counseling. You are more likely to quit if you go to counseling sessions regularly. Take medicine You may take medicines to help you quit. Some medicines need a prescription, and some you can buy over-the-counter. Some medicines may contain a drug called nicotine to replace the nicotine in cigarettes. Medicines may:  Help you to stop having the desire to smoke (cravings).  Help to stop the problems that come when you stop smoking (withdrawal symptoms). Your doctor may ask you to use:  Nicotine patches, gum, or lozenges.  Nicotine inhalers or sprays.  Non-nicotine medicine that is taken by mouth. Find resources Find resources and other ways to help you quit smoking and remain smoke-free after you quit. These resources are most helpful when you use them often. They include:  Online chats with a Social worker.  Phone quitlines.  Printed Furniture conservator/restorer.  Support groups or group counseling.  Text messaging programs.  Mobile phone apps. Use apps on your  mobile phone or tablet that can help you stick to your quit plan. There are many free apps for mobile phones and tablets as well as websites. Examples include Quit Guide from the [employer] and smokefree.gov  What things can I do to make it easier to quit?   Talk to your family and friends. Ask them to support and encourage you.  Call a phone quitline (1-800-QUIT-NOW), reach out to support groups, or work with a Social worker.  Ask people who smoke to not smoke around you.  Avoid places that make you want to smoke, such as: ? Bars. ? Parties. ? Smoke-break areas at work.  Spend  time with people who do not smoke.  Lower the stress in your life. Stress can make you want to smoke. Try these things to help your stress: ? Getting regular exercise. ? Doing deep-breathing exercises. ? Doing yoga. ? Meditating. ? Doing a body scan. To do this, close your eyes, focus on one area of your body at a time from head to toe. Notice which parts of your body are tense. Try to relax the muscles in those areas. How will I feel when I quit smoking? Day 1 to 3 weeks Within the first 24 hours, you may start to have some problems that come from quitting tobacco. These problems are very bad 2-3 days after you quit, but they do not often last for more than 2-3 weeks. You may get these symptoms:  Mood swings.  Feeling restless, nervous, angry, or annoyed.  Trouble concentrating.  Dizziness.  Strong desire for high-sugar foods and nicotine.  Weight gain.  Trouble pooping (constipation).  Feeling like you may vomit (nausea).  Coughing or a sore throat.  Changes in how the medicines that you take for other issues work in your body.  Depression.  Trouble sleeping (insomnia). Week 3 and afterward After the first 2-3 weeks of quitting, you may start to notice more positive results, such as:  Better sense of smell and taste.  Less coughing and sore throat.  Slower heart rate.  Lower blood pressure.  Clearer skin.  Better breathing.  Fewer sick days. Quitting smoking can be hard. Do not give up if you fail the first time. Some people need to try a few times before they succeed. Do your best to stick to your quit plan, and talk with your doctor if you have any questions or concerns. Summary  Smoking tobacco is the leading cause of preventable death. Quitting smoking can be hard, but it is one of the best things that you can do for your health.  When you decide to quit smoking, make a plan to help you succeed.  Quit smoking right away, not slowly over a period of  time.  When you start quitting, seek help from your doctor, family, or friends. This information is not intended to replace advice given to you by your health care provider. Make sure you discuss any questions you have with your health care provider. Document Released: 08/29/2009 Document Revised: 01/20/2019 Document Reviewed: 01/21/2019 Elsevier Patient Education  2020 [employer].

## 2019-09-25 NOTE — Assessment & Plan Note (Signed)

## 2019-09-25 NOTE — Progress Notes (Signed)
    Lydia Perez     MRN: [id number]      DOB: 06/23/1956  HPI: Patient is in for annual physical exam. C/o increased anxiety and stress due to life circumstances, wants medication for this Smoking has increased up  To 1 PPD , wants to quit and wants help fo this Immunization is reviewed , and  Is up to date.   PE: BP 120/84   Pulse 81   Temp (!) 97.3 F (36.3 C) (Temporal)   Resp 15   Ht 5\' 4"  (1.626 m)   Wt 182 lb (82.6 kg)   SpO2 97%   BMI 31.24 kg/m   Pleasant  female, alert and oriented x 3, in no cardio-pulmonary distress. Afebrile. HEENT No facial trauma or asymetry. Sinuses non tender.  Extra occullar muscles intact.. External ears normal, . Neck: supple, no adenopathy,JVD or thyromegaly.No bruits.  Chest: Clear to ascultation bilaterally.No crackles or wheezes. Non tender to palpation  Breast: No asymetry,no masses or lumps. No tenderness. No nipple discharge or inversion. No axillary or supraclavicular adenopathy  Cardiovascular system; Heart sounds normal,  S1 and  S2 ,no S3.  No murmur, or thrill. Apical beat not displaced Peripheral pulses normal.  Abdomen: Soft, non tender, no organomegaly or masses. No bruits. Bowel sounds normal. No guarding, tenderness or rebound.   GU: Not examined  Musculoskeletal exam: Full ROM of spine, hips , shoulders and knees. No deformity ,swelling or crepitus noted. No muscle wasting or atrophy.   Neurologic: Cranial nerves 2 to 12 intact. Power, tone ,sensation and reflexes normal throughout. No disturbance in gait. No tremor.  Skin: Intact, no ulceration, erythema , scaling or rash noted. Pigmentation normal throughout  Psych; Mildly anxious, not depressed apparng. Judgement and concentration normal   Assessment & Plan:  Annual physical exam Annual exam as documented. Counseling done  re healthy lifestyle involving commitment to 150 minutes exercise per week, heart healthy diet, and attaining  healthy weight.The importance of adequate sleep also discussed. Regular seat belt use and home safety, is also discussed. Changes in health habits are decided on by the patient with goals and time frames  set for achieving them. Immunization and cancer screening needs are specifically addressed at this visit.   Anxiety Start twice daily buspar as this  has worsened  Nicotine dependence Asked:confirms currently smokes cigarettes Assess: Unwilling to quit but cutting back Advise: needs to QUIT to reduce risk of cancer, cardio and cerebrovascular disease Assist: counseled for 5 minutes and literature provided Arrange: follow up in 3 months   Reduced vision Reduced vision esp in left eye, refer to opthalmology/ optometry

## 2019-09-26 ENCOUNTER — Encounter: Payer: Self-pay | Admitting: Family Medicine

## 2019-09-26 DIAGNOSIS — H547 Unspecified visual loss: Secondary | ICD-10-CM | POA: Insufficient documentation

## 2019-09-26 NOTE — Assessment & Plan Note (Signed)
Reduced vision esp in left eye, refer to opthalmology/ optometry

## 2019-09-26 NOTE — Assessment & Plan Note (Signed)
Asked:confirms currently smokes cigarettes Assess: Unwilling to quit but cutting back Advise: needs to QUIT to reduce risk of cancer, cardio and cerebrovascular disease Assist: counseled for 5 minutes and literature provided Arrange: follow up in 3 months  

## 2019-09-27 DIAGNOSIS — R7989 Other specified abnormal findings of blood chemistry: Secondary | ICD-10-CM | POA: Diagnosis not present

## 2019-09-27 DIAGNOSIS — E559 Vitamin D deficiency, unspecified: Secondary | ICD-10-CM | POA: Diagnosis not present

## 2019-09-27 DIAGNOSIS — I10 Essential (primary) hypertension: Secondary | ICD-10-CM | POA: Diagnosis not present

## 2019-09-27 DIAGNOSIS — R7301 Impaired fasting glucose: Secondary | ICD-10-CM | POA: Diagnosis not present

## 2019-09-28 ENCOUNTER — Encounter: Payer: Self-pay | Admitting: Family Medicine

## 2019-09-28 ENCOUNTER — Other Ambulatory Visit: Payer: Self-pay | Admitting: Family Medicine

## 2019-09-28 LAB — CBC
HCT: 40.6 % (ref 35.0–45.0)
Hemoglobin: 13.3 g/dL (ref 11.7–15.5)
MCH: 30.9 pg (ref 27.0–33.0)
MCHC: 32.8 g/dL (ref 32.0–36.0)
MCV: 94.4 fL (ref 80.0–100.0)
MPV: 10.7 fL (ref 7.5–12.5)
Platelets: 208 10*3/uL (ref 140–400)
RBC: 4.3 10*6/uL (ref 3.80–5.10)
RDW: 12.6 % (ref 11.0–15.0)
WBC: 5.2 10*3/uL (ref 3.8–10.8)

## 2019-09-28 LAB — HEMOGLOBIN A1C
Hgb A1c MFr Bld: 5.5 % of total Hgb (ref <5.7)
Mean Plasma Glucose: 111 (calc)
eAG (mmol/L): 6.2 (calc)

## 2019-09-28 LAB — COMPLETE METABOLIC PANEL WITH GFR
AG Ratio: 1.5 (calc) (ref 1.0–2.5)
ALT: 11 U/L (ref 6–29)
AST: 15 U/L (ref 10–35)
Albumin: 4.3 g/dL (ref 3.6–5.1)
Alkaline phosphatase (APISO): 56 U/L (ref 37–153)
BUN: 13 mg/dL (ref 7–25)
CO2: 28 mmol/L (ref 20–32)
Calcium: 9.5 mg/dL (ref 8.6–10.4)
Chloride: 104 mmol/L (ref 98–110)
Creat: 0.95 mg/dL (ref 0.50–0.99)
GFR, Est African American: 75 mL/min/{1.73_m2} (ref >=60)
GFR, Est Non African American: 65 mL/min/{1.73_m2} (ref >=60)
Globulin: 2.8 g/dL (calc) (ref 1.9–3.7)
Glucose, Bld: 87 mg/dL (ref 65–99)
Potassium: 3.9 mmol/L (ref 3.5–5.3)
Sodium: 142 mmol/L (ref 135–146)
Total Bilirubin: 0.6 mg/dL (ref 0.2–1.2)
Total Protein: 7.1 g/dL (ref 6.1–8.1)

## 2019-09-28 LAB — VITAMIN D 25 HYDROXY (VIT D DEFICIENCY, FRACTURES): Vit D, 25-Hydroxy: 9 ng/mL — ABNORMAL LOW (ref 30–100)

## 2019-09-28 LAB — TSH: TSH: 2.71 mIU/L (ref 0.40–4.50)

## 2019-09-28 MED ORDER — ERGOCALCIFEROL 1.25 MG (50000 UT) PO CAPS: 50000.0000 [IU] | ORAL_CAPSULE | ORAL | 1 refills | Status: DC

## 2019-11-13 ENCOUNTER — Other Ambulatory Visit: Payer: Self-pay

## 2019-11-13 ENCOUNTER — Ambulatory Visit: Payer: BC Managed Care – PPO | Attending: Internal Medicine

## 2019-11-13 DIAGNOSIS — Z20828 Contact with and (suspected) exposure to other viral communicable diseases: Secondary | ICD-10-CM | POA: Diagnosis not present

## 2019-11-13 DIAGNOSIS — Z20822 Contact with and (suspected) exposure to covid-19: Secondary | ICD-10-CM

## 2019-11-15 LAB — NOVEL CORONAVIRUS, NAA: SARS-CoV-2, NAA: NOT DETECTED

## 2019-11-27 ENCOUNTER — Ambulatory Visit: Payer: BC Managed Care – PPO | Admitting: Family Medicine

## 2019-11-27 ENCOUNTER — Other Ambulatory Visit: Payer: Self-pay

## 2020-04-17 ENCOUNTER — Other Ambulatory Visit: Payer: Self-pay

## 2020-04-17 ENCOUNTER — Ambulatory Visit (INDEPENDENT_AMBULATORY_CARE_PROVIDER_SITE_OTHER): Payer: BC Managed Care – PPO | Admitting: Family Medicine

## 2020-04-17 ENCOUNTER — Encounter: Payer: Self-pay | Admitting: Family Medicine

## 2020-04-17 VITALS — BP 144/82 | HR 72 | Temp 97.1°F | Resp 15 | Ht 64.0 in | Wt 187.0 lb

## 2020-04-17 DIAGNOSIS — H6122 Impacted cerumen, left ear: Secondary | ICD-10-CM

## 2020-04-17 DIAGNOSIS — M25562 Pain in left knee: Secondary | ICD-10-CM

## 2020-04-17 DIAGNOSIS — H6121 Impacted cerumen, right ear: Secondary | ICD-10-CM | POA: Insufficient documentation

## 2020-04-17 DIAGNOSIS — R7301 Impaired fasting glucose: Secondary | ICD-10-CM

## 2020-04-17 DIAGNOSIS — I10 Essential (primary) hypertension: Secondary | ICD-10-CM

## 2020-04-17 DIAGNOSIS — F17218 Nicotine dependence, cigarettes, with other nicotine-induced disorders: Secondary | ICD-10-CM

## 2020-04-17 DIAGNOSIS — D126 Benign neoplasm of colon, unspecified: Secondary | ICD-10-CM | POA: Diagnosis not present

## 2020-04-17 DIAGNOSIS — F419 Anxiety disorder, unspecified: Secondary | ICD-10-CM

## 2020-04-17 DIAGNOSIS — E559 Vitamin D deficiency, unspecified: Secondary | ICD-10-CM

## 2020-04-17 DIAGNOSIS — A048 Other specified bacterial intestinal infections: Secondary | ICD-10-CM

## 2020-04-17 DIAGNOSIS — K219 Gastro-esophageal reflux disease without esophagitis: Secondary | ICD-10-CM

## 2020-04-17 DIAGNOSIS — R1013 Epigastric pain: Secondary | ICD-10-CM

## 2020-04-17 DIAGNOSIS — E785 Hyperlipidemia, unspecified: Secondary | ICD-10-CM

## 2020-04-17 MED ORDER — PREDNISONE 10 MG PO TABS: 10.0000 mg | ORAL_TABLET | Freq: Two times a day (BID) | ORAL | 0 refills | Status: DC

## 2020-04-17 MED ORDER — PANTOPRAZOLE SODIUM 40 MG PO TBEC: 40.0000 mg | DELAYED_RELEASE_TABLET | Freq: Every day | ORAL | 1 refills | Status: DC

## 2020-04-17 NOTE — Assessment & Plan Note (Addendum)
Uncontrolled, check H pylori and  , start pPI and refer GI, reports dysphagia in past several months for solids

## 2020-04-17 NOTE — Patient Instructions (Addendum)
F/U in office with MD early September , call if you need me before  Mammogram will be scheduled at checkout  Left ear irrigation today  Please get H pylori breath test TODAY  We will call with appointment for [place] of your gallbladder  New is Protonix for reflux and you are referred to GI to evaluate reflux with difficulty swallowing and possible colonoscopy  5 day coursde of prednisone prescribed for left knee pain  Fasting lipid chem 7 and Egfr and Vitamin D level as soon  As possible please  Thanks for choosing [facility], we consider it a privelige to serve you.

## 2020-04-17 NOTE — Assessment & Plan Note (Signed)

## 2020-04-17 NOTE — Progress Notes (Signed)
MICHEALE SCANLIN     MRN: [id number]      DOB: [birth year]   HPI Ms. Quinley is here for follow up and re-evaluation of chronic medical conditions, medication management and review of any available recent lab and radiology data.  Preventive health is updated, specifically  Cancer screening and Immunization.   Questions or concerns regarding consultations or procedures which the PT has had in the interim are  addressed. The PT denies any adverse reactions to current medications since the last visit.  6 month h/o bloating , pain uncontrolled gERD symptoms and solid dysphagia 2 week h/o left ear fullness, now better Left knee pain  X 5  Days she was kneeling for approx 2 mins on the job, and since then localized pain anteriorly, medical aspect,   ROS Denies recent fever or chills. Denies sinus pressure, nasal congestion, ear pain or sore throat. Denies chest congestion, productive cough or wheezing. Denies chest pains, palpitations and leg swelling  Denies dysuria, frequency, hesitancy or incontinence.  Denies headaches, seizures, numbness, or tingling. C/o  depression, and anxiety states copes with smoking , wants to but incapable of commiting to quitting now Denies skin break down or rash.   PE  BP 120/82   Pulse 72   Temp (!) 97.1 F (36.2 C) (Temporal)   Resp 15   Ht 5\' 4"  (1.626 m)   Wt 187 lb (84.8 kg)   SpO2 99%   BMI 32.10 kg/m   Patient alert and oriented and in no cardiopulmonary distress.  HEENT: No facial asymmetry, EOMI,     Neck supple .Lefdt tM occluded by wax  Chest: Clear to auscultation bilaterally.  CVS: S1, S2 no murmurs, no S3.Regular rate.  ABD: Soft mild epigastric and RUQ tenderness, no guarding or rebound tenderness, no palpable organomegaly or mass   Ext: No edema  MS: Adequate ROM spine, shoulders, hips and reduced in right knee with tenderness over patella  Skin: Intact, no ulcerations or rash noted.  Psych: Good eye contact, normal  affect. Memory intact  anxious and mildly depressed appearing.  CNS: CN 2-12 intact, power,  normal throughout.no focal deficits noted.   Assessment & Plan  Nicotine dependence Patient counseled for approximately 5 minutes regarding the health risks of ongoing nicotine use, specifically all types of cancer, heart disease, stroke and respiratory failure. The options available for help with cessation ,the behavioral changes to assist the process, and the option to either gradully reduce usage  Or abruptly stop.is also discussed. Pt is also encouraged to set specific goals in number of cigarettes used daily, as well as to set a quit date.     GERD Uncontrolled, check H pylori and  , start pPI and refer GI, reports dysphagia in past several months for solids  Positive H. pylori test Two week course of biaxin and amoxicillin prescribed  Impacted cerumen, left ear Successful flush in office by nursing   Anxiety Increased and uncontrolled encouraged to resume treatment with prescription medication rather than cigarettes, not commiting at this time  Left knee pain 5 day course of prednisone prescribed, she is to call if persists for Ortho eval  Dyspepsia Refer for [place] of gallbladder due to worsening symptoms in past 3 to 4 months  Essential hypertension Controlled, no change in medication DASH diet and commitment to daily physical activity for a minimum of 30 minutes discussed and encouraged, as a part of hypertension management. The importance of attaining a healthy weight is also  discussed.  BP/Weight 04/17/2020 09/25/2019 08/22/2019 02/01/2019 09/14/2018 [id number]  Systolic BP 123456 123456 123456 [id number]  Diastolic BP 82 84 70 82 88 88 80  Wt. (Lbs) 187 182 180.12 172.12 168 168 168  BMI 32.1 31.24 30.92 29.54 28.84 28.84 28.84

## 2020-04-18 ENCOUNTER — Encounter: Payer: Self-pay | Admitting: Family Medicine

## 2020-04-18 ENCOUNTER — Other Ambulatory Visit: Payer: Self-pay | Admitting: Family Medicine

## 2020-04-18 ENCOUNTER — Encounter: Payer: Self-pay | Admitting: Internal Medicine

## 2020-04-18 LAB — H. PYLORI BREATH TEST: H. pylori Breath Test: DETECTED — AB

## 2020-04-18 MED ORDER — CLARITHROMYCIN 500 MG PO TABS
ORAL_TABLET | ORAL | 0 refills | Status: DC
Start: 2020-04-18 — End: 2020-04-29

## 2020-04-18 MED ORDER — AMOXICILLIN 500 MG PO CAPS
ORAL_CAPSULE | ORAL | 0 refills | Status: DC
Start: 2020-04-18 — End: 2020-04-29

## 2020-04-18 NOTE — Progress Notes (Signed)
Amoxil 1

## 2020-04-21 ENCOUNTER — Encounter: Payer: Self-pay | Admitting: Family Medicine

## 2020-04-21 DIAGNOSIS — A048 Other specified bacterial intestinal infections: Secondary | ICD-10-CM | POA: Insufficient documentation

## 2020-04-21 DIAGNOSIS — R1013 Epigastric pain: Secondary | ICD-10-CM | POA: Insufficient documentation

## 2020-04-21 DIAGNOSIS — M25562 Pain in left knee: Secondary | ICD-10-CM | POA: Insufficient documentation

## 2020-04-21 DIAGNOSIS — H6122 Impacted cerumen, left ear: Secondary | ICD-10-CM | POA: Insufficient documentation

## 2020-04-21 NOTE — Assessment & Plan Note (Signed)
Refer for [place] of gallbladder due to worsening symptoms in past 3 to 4 months

## 2020-04-21 NOTE — Assessment & Plan Note (Signed)
Increased and uncontrolled encouraged to resume treatment with prescription medication rather than cigarettes, not commiting at this time

## 2020-04-21 NOTE — Assessment & Plan Note (Signed)
Controlled, no change in medication DASH diet and commitment to daily physical activity for a minimum of 30 minutes discussed and encouraged, as a part of hypertension management. The importance of attaining a healthy weight is also discussed.  BP/Weight 04/17/2020 09/25/2019 08/22/2019 02/01/2019 09/14/2018 02/18/5847 01/19/756  Systolic BP 322 567 209 198 022 179 810  Diastolic BP 82 84 70 82 88 88 80  Wt. (Lbs) 187 182 180.12 172.12 168 168 168  BMI 32.1 31.24 30.92 29.54 28.84 28.84 28.84

## 2020-04-21 NOTE — Assessment & Plan Note (Signed)
5 day course of prednisone prescribed, she is to call if persists for Ortho eval

## 2020-04-21 NOTE — Assessment & Plan Note (Signed)
Successful flush in office by nursing

## 2020-04-21 NOTE — Assessment & Plan Note (Signed)
Two week course of biaxin and amoxicillin prescribed

## 2020-04-25 ENCOUNTER — Encounter: Payer: Self-pay | Admitting: Family Medicine

## 2020-04-29 ENCOUNTER — Other Ambulatory Visit: Payer: Self-pay

## 2020-04-29 ENCOUNTER — Ambulatory Visit (HOSPITAL_COMMUNITY)
Admission: RE | Admit: 2020-04-29 | Discharge: 2020-04-29 | Disposition: A | Discharge status: Home / Self Care | Payer: BC Managed Care – PPO | Source: Ambulatory Visit | Attending: Family Medicine | Admitting: Family Medicine

## 2020-04-29 ENCOUNTER — Other Ambulatory Visit: Payer: Self-pay | Admitting: Family Medicine

## 2020-04-29 DIAGNOSIS — R1013 Epigastric pain: Secondary | ICD-10-CM | POA: Insufficient documentation

## 2020-04-29 DIAGNOSIS — R140 Abdominal distension (gaseous): Secondary | ICD-10-CM | POA: Diagnosis not present

## 2020-04-29 MED ORDER — PANTOPRAZOLE SODIUM 40 MG PO TBEC
40.0000 mg | DELAYED_RELEASE_TABLET | Freq: Two times a day (BID) | ORAL | 0 refills | Status: DC
Start: 2020-04-30 — End: 2020-06-24

## 2020-04-29 MED ORDER — BIS SUBCIT-METRONID-TETRACYC 140-125-125 MG PO CAPS: 3.0000 | ORAL_CAPSULE | Freq: Three times a day (TID) | ORAL | 0 refills | Status: DC

## 2020-04-29 NOTE — Progress Notes (Signed)
pylera prescribed, stop amoxil and clarithromycin

## 2020-05-10 ENCOUNTER — Other Ambulatory Visit: Payer: Self-pay | Admitting: Family Medicine

## 2020-05-10 ENCOUNTER — Other Ambulatory Visit: Payer: Self-pay

## 2020-05-10 ENCOUNTER — Ambulatory Visit
Admission: RE | Admit: 2020-05-10 | Discharge: 2020-05-10 | Disposition: A | Discharge status: Home / Self Care | Payer: BC Managed Care – PPO | Source: Ambulatory Visit | Attending: Family Medicine | Admitting: Family Medicine

## 2020-05-10 DIAGNOSIS — Z1231 Encounter for screening mammogram for malignant neoplasm of breast: Secondary | ICD-10-CM

## 2020-06-18 ENCOUNTER — Ambulatory Visit: Payer: BC Managed Care – PPO | Admitting: Gastroenterology

## 2020-06-24 ENCOUNTER — Other Ambulatory Visit: Payer: Self-pay

## 2020-06-24 ENCOUNTER — Encounter (HOSPITAL_COMMUNITY): Payer: Self-pay

## 2020-06-24 ENCOUNTER — Ambulatory Visit: Payer: BC Managed Care – PPO | Admitting: Family Medicine

## 2020-06-24 ENCOUNTER — Encounter: Payer: Self-pay | Admitting: Family Medicine

## 2020-06-24 ENCOUNTER — Other Ambulatory Visit (HOSPITAL_COMMUNITY): Payer: Self-pay

## 2020-06-24 VITALS — BP 130/79 | HR 63 | Temp 98.1°F | Resp 18 | Ht 64.0 in | Wt 185.1 lb

## 2020-06-24 DIAGNOSIS — R079 Chest pain, unspecified: Secondary | ICD-10-CM | POA: Insufficient documentation

## 2020-06-24 DIAGNOSIS — Z23 Encounter for immunization: Secondary | ICD-10-CM

## 2020-06-24 DIAGNOSIS — F17218 Nicotine dependence, cigarettes, with other nicotine-induced disorders: Secondary | ICD-10-CM

## 2020-06-24 DIAGNOSIS — Z122 Encounter for screening for malignant neoplasm of respiratory organs: Secondary | ICD-10-CM

## 2020-06-24 DIAGNOSIS — I10 Essential (primary) hypertension: Secondary | ICD-10-CM

## 2020-06-24 DIAGNOSIS — E785 Hyperlipidemia, unspecified: Secondary | ICD-10-CM

## 2020-06-24 DIAGNOSIS — F419 Anxiety disorder, unspecified: Secondary | ICD-10-CM

## 2020-06-24 DIAGNOSIS — E559 Vitamin D deficiency, unspecified: Secondary | ICD-10-CM

## 2020-06-24 DIAGNOSIS — R0789 Other chest pain: Secondary | ICD-10-CM | POA: Diagnosis not present

## 2020-06-24 DIAGNOSIS — E663 Overweight: Secondary | ICD-10-CM

## 2020-06-24 DIAGNOSIS — Z87891 Personal history of nicotine dependence: Secondary | ICD-10-CM

## 2020-06-24 MED ORDER — BUPROPION HCL ER (SMOKING DET) 150 MG PO TB12
150.0000 mg | ORAL_TABLET | Freq: Two times a day (BID) | ORAL | 3 refills | Status: DC
Start: 2020-06-24 — End: 2020-10-02

## 2020-06-24 NOTE — Patient Instructions (Addendum)
Annual physical exam in office with MD early November call if you need me sooner.Shingrix # 2 at visit Labs Today CBC lipids CMP and EGFR TSH and vitamin D.  Shingrix # 1 today Currently smoking 1 pack/day need to quit.  I recommend either starting with gradual knee reducing as we discussed and you may even start at a lower number with this and please fill and start taking Wellbutrin as soon as you have a total quit date of 2 weeks in mind.  Please reschedule your gI appointment  EKG in office today because of new chest tightness with lightheadedness.  You are also being referred to cardiology because of positive family history of premature coronary artery disease.  With your 30-pack-year history of cigarette smoking you are referred for annual screening chest scans.  Quitting is the most important health habit that you need to change and there are many resources to help you with this.  Call 1 800 quit now 24 hours a day 7 days/week for assistance.  It is important that you exercise regularly at least 30 minutes 5 times a week. If you develop chest pain, have severe difficulty breathing, or feel very tired, stop exercising immediately and seek medical attention  Think about what you will eat, plan ahead. Choose " clean, green, fresh or frozen" over canned, processed or packaged foods which are more sugary, salty and fatty. 70 to 75% of food eaten should be vegetables and fruit. Three meals at set times with snacks allowed between meals, but they must be fruit or vegetables. Aim to eat over a 12 hour period , example 7 am to 7 pm, and STOP after  your last meal of the day. Drink water,generally about 64 ounces per day, no other drink is as healthy. Fruit juice is best enjoyed in a healthy way, by EATING the fruit.   Thanks for choosing [facility], we consider it a privelige to serve you.  Steps to Quit Smoking Smoking tobacco is the leading cause of preventable death. It can  affect almost every organ in the body. Smoking puts you and people around you at risk for many serious, long-lasting (chronic) diseases. Quitting smoking can be hard, but it is one of the best things that you can do for your health. It is never too late to quit. How do I get ready to quit? When you decide to quit smoking, make a plan to help you succeed. Before you quit:  Pick a date to quit. Set a date within the next 2 weeks to give you time to prepare.  Write down the reasons why you are quitting. Keep this list in places where you will see it often.  Tell your family, friends, and co-workers that you are quitting. Their support is important.  Talk with your doctor about the choices that may help you quit.  Find out if your health insurance will pay for these treatments.  Know the people, places, things, and activities that make you want to smoke (triggers). Avoid them. What first steps can I take to quit smoking?  Throw away all cigarettes at home, at work, and in your car.  Throw away the things that you use when you smoke, such as ashtrays and lighters.  Clean your car. Make sure to empty the ashtray.  Clean your home, including curtains and carpets. What can I do to help me quit smoking? Talk with your doctor about taking medicines and seeing a counselor at the same   time. You are more likely to succeed when you do both.  If you are pregnant or breastfeeding, talk with your doctor about counseling or other ways to quit smoking. Do not take medicine to help you quit smoking unless your doctor tells you to do so. To quit smoking: Quit right away  Quit smoking totally, instead of slowly cutting back on how much you smoke over a period of time.  Go to counseling. You are more likely to quit if you go to counseling sessions regularly. Take medicine You may take medicines to help you quit. Some medicines need a prescription, and some you can buy over-the-counter. Some medicines may  contain a drug called nicotine to replace the nicotine in cigarettes. Medicines may:  Help you to stop having the desire to smoke (cravings).  Help to stop the problems that come when you stop smoking (withdrawal symptoms). Your doctor may ask you to use:  Nicotine patches, gum, or lozenges.  Nicotine inhalers or sprays.  Non-nicotine medicine that is taken by mouth. Find resources Find resources and other ways to help you quit smoking and remain smoke-free after you quit. These resources are most helpful when you use them often. They include:  Online chats with a Social worker.  Phone quitlines.  Printed Furniture conservator/restorer.  Support groups or group counseling.  Text messaging programs.  Mobile phone apps. Use apps on your mobile phone or tablet that can help you stick to your quit plan. There are many free apps for mobile phones and tablets as well as websites. Examples include Quit Guide from the [employer] and smokefree.gov  What things can I do to make it easier to quit?   Talk to your family and friends. Ask them to support and encourage you.  Call a phone quitline (1-800-QUIT-NOW), reach out to support groups, or work with a Social worker.  Ask people who smoke to not smoke around you.  Avoid places that make you want to smoke, such as: ? Bars. ? Parties. ? Smoke-break areas at work.  Spend time with people who do not smoke.  Lower the stress in your life. Stress can make you want to smoke. Try these things to help your stress: ? Getting regular exercise. ? Doing deep-breathing exercises. ? Doing yoga. ? Meditating. ? Doing a body scan. To do this, close your eyes, focus on one area of your body at a time from head to toe. Notice which parts of your body are tense. Try to relax the muscles in those areas. How will I feel when I quit smoking? Day 1 to 3 weeks Within the first 24 hours, you may start to have some problems that come from quitting tobacco. These problems are  very bad 2-3 days after you quit, but they do not often last for more than 2-3 weeks. You may get these symptoms:  Mood swings.  Feeling restless, nervous, angry, or annoyed.  Trouble concentrating.  Dizziness.  Strong desire for high-sugar foods and nicotine.  Weight gain.  Trouble pooping (constipation).  Feeling like you may vomit (nausea).  Coughing or a sore throat.  Changes in how the medicines that you take for other issues work in your body.  Depression.  Trouble sleeping (insomnia). Week 3 and afterward After the first 2-3 weeks of quitting, you may start to notice more positive results, such as:  Better sense of smell and taste.  Less coughing and sore throat.  Slower heart rate.  Lower blood pressure.  Clearer skin.  Better breathing.  Fewer sick days. Quitting smoking can be hard. Do not give up if you fail the first time. Some people need to try a few times before they succeed. Do your best to stick to your quit plan, and talk with your doctor if you have any questions or concerns. Summary  Smoking tobacco is the leading cause of preventable death. Quitting smoking can be hard, but it is one of the best things that you can do for your health.  When you decide to quit smoking, make a plan to help you succeed.  Quit smoking right away, not slowly over a period of time.  When you start quitting, seek help from your doctor, family, or friends. This information is not intended to replace advice given to you by your health care provider. Make sure you discuss any questions you have with your health care provider. Document Revised: 07/28/2019 Document Reviewed: 01/21/2019 Elsevier Patient Education  2020 Elsevier Inc.  

## 2020-06-24 NOTE — Progress Notes (Signed)
Received referral for initial lung cancer screening scan.  Contacted patient and obtained smoking history (started age 64 smoking 1/4ppd until the age of 73 when she began smoking 1/2ppd, at the age of 71 she began smoking 1ppd and is currently still smoking 1ppd, 33 pack year) as well as answering questions related to the screening process.  Patient denies signs/symptoms of lung cancer such as weight loss or hemoptysis.  Patient denies comorbidity that would prevent curative treatment if lung cancer were to be found.  Patient is scheduled for shared decision making visit and CT scan on July 12, 2020 at 1030.

## 2020-06-24 NOTE — Progress Notes (Signed)
The patient was referred to the [facility] by Dr. Moshe Cipro. I attempted to contact patient but was unable to reach the patient. I left a detailed VM about the program and encouraged the patient to call me back for program enrollment.

## 2020-06-24 NOTE — Assessment & Plan Note (Signed)
New onset symptomatic chest pain, possiitve f/h cAD, needs EKG and Card eval

## 2020-06-24 NOTE — Progress Notes (Signed)
Lydia Perez     MRN: [id number]      DOB: 29-Dec-1955   HPI Ms. Free is here for follow up and re-evaluation of chronic medical conditions, medication management and review of any available recent lab and radiology data.  Preventive health is updated, specifically  Cancer screening and Immunization.   Missed GI appointment said she did not feel well the morning  The PT denies any adverse reactions to current medications since the last visit.  Worried and anxious about covid , retired but smoking is a challenge , now at 1 PPD  Intermittent chest tightness with light headedness several weeks ago, lasts 15 minutes Mother had CAD in her late 75'S's had CABG x 3  ROS Denies recent fever or chills. Denies sinus pressure, nasal congestion, ear pain or sore throat. Denies chest congestion, productive cough or wheezing. Denies  palpitations and leg swelling Denies abdominal pain, nausea, vomiting,diarrhea or constipation.   Denies dysuria, frequency, hesitancy or incontinence. Denies joint pain, swelling and limitation in mobility. Denies headaches, seizures, numbness, or tingling.  Denies skin break down or rash.   PE  BP 130/79 (BP Location: Right Arm, Patient Position: Sitting, Cuff Size: Normal)   Pulse 63   Temp 98.1 F (36.7 C) (Temporal)   Resp 18   Ht 5\' 4"  (1.626 m)   Wt 185 lb 1.9 oz (84 kg)   SpO2 96%   BMI 31.78 kg/m   Patient alert and oriented and in no cardiopulmonary distress.  HEENT: No facial asymmetry, EOMI,     Neck supple .  Chest: Clear to auscultation bilaterally.  CVS: S1, S2 no murmurs, no S3.Regular rate.No reproducible chest pain on pressure ZJI:RCVEL bradycardia, no lVH , no ischemia  ABD: Soft non tender.   Ext: No edema  MS: Adequate ROM spine, shoulders, hips and knees.  Skin: Intact, no ulcerations or rash noted.  Psych: Good eye contact, normal affect. Memory intact mildly  anxious not  depressed appearing.  CNS: CN 2-12  intact, power,  normal throughout.no focal deficits noted.   Assessment & Plan  Chest pain New onset symptomatic chest pain, possiitve f/h cAD, needs EKG and Card eval  Nicotine dependence Over 30 pack year hiostory started at 75, 1 PPD at 29, needs annual screen, deteriorated due to anxiety , back up to 1 PPD Asked:confirms currently smokes cigarettes Assess: Unwilling to set a quit date, but is cutting back Advise: needs to QUIT to reduce risk of cancer, cardio and cerebrovascular disease Assist: counseled for 5 minutes and literature provided Arrange: follow up in 2 to 4 months   Essential hypertension Controlled, no change in medication DASH diet and commitment to daily physical activity for a minimum of 30 minutes discussed and encouraged, as a part of hypertension management. The importance of attaining a healthy weight is also discussed.  BP/Weight 06/24/2020 04/17/2020 09/25/2019 08/22/2019 02/01/2019 38/08/1750 0/12/5850  Systolic BP 778 242 353 614 431 540 086  Diastolic BP 79 82 84 70 82 88 88  Wt. (Lbs) 185.12 187 182 180.12 172.12 168 168  BMI 31.78 32.1 31.24 30.92 29.54 28.84 28.84       Dyslipidemia Hyperlipidemia:Low fat diet discussed and encouraged.   Lipid Panel  Lab Results  Component Value Date   CHOL 206 (H) 06/24/2020   HDL 44 06/24/2020   LDLCALC 147 (H) 06/24/2020   TRIG 83 06/24/2020   CHOLHDL 4.7 (H) 06/24/2020     Needs to reduce fried and fatty  foods  Overweight (BMI 25.0-29.9)  Patient re-educated about  the importance of commitment to a  minimum of 150 minutes of exercise per week as able.  The importance of healthy food choices with portion control discussed, as well as eating regularly and within a 12 hour window most days. The need to choose "clean , green" food 50 to 75% of the time is discussed, as well as to make water the primary drink and set a goal of 64 ounces water daily.    Weight /BMI 06/24/2020 04/17/2020 09/25/2019  WEIGHT  185 lb 1.9 oz 187 lb 182 lb  HEIGHT 5\' 4"  5\' 4"  5\' 4"   BMI 31.78 kg/m2 32.1 kg/m2 31.24 kg/m2      Anxiety Increased due to resurgence of covid, pt ventilated for approx 5 mins  Need for varicella vaccine After obtaining informed consent, the vaccine is  administered , with no adverse effect noted at the time of administration.

## 2020-06-24 NOTE — Assessment & Plan Note (Addendum)
Over 30 pack year hiostory started at 70, 1 PPD at 61, needs annual screen, deteriorated due to anxiety , back up to 1 PPD Asked:confirms currently smokes cigarettes Assess: Unwilling to set a quit date, but is cutting back Advise: needs to [place] to reduce risk of cancer, cardio and cerebrovascular disease Assist: counseled for 5 minutes and literature provided Arrange: follow up in 2 to 4 months

## 2020-06-25 LAB — CBC
Hematocrit: 44.8 % (ref 34.0–46.6)
Hemoglobin: 14.8 g/dL (ref 11.1–15.9)
MCH: 31 pg (ref 26.6–33.0)
MCHC: 33 g/dL (ref 31.5–35.7)
MCV: 94 fL (ref 79–97)
Platelets: 223 10*3/uL (ref 150–450)
RBC: 4.78 x10E6/uL (ref 3.77–5.28)
RDW: 12.6 % (ref 11.7–15.4)
WBC: 5.5 10*3/uL (ref 3.4–10.8)

## 2020-06-25 LAB — CMP14+EGFR
ALT: 11 IU/L (ref 0–32)
AST: 19 IU/L (ref 0–40)
Albumin/Globulin Ratio: 1.3 (ref 1.2–2.2)
Albumin: 4.1 g/dL (ref 3.8–4.8)
Alkaline Phosphatase: 78 IU/L (ref 48–121)
BUN/Creatinine Ratio: 11 — ABNORMAL LOW (ref 12–28)
BUN: 13 mg/dL (ref 8–27)
Bilirubin Total: 0.4 mg/dL (ref 0.0–1.2)
CO2: 25 mmol/L (ref 20–29)
Calcium: 9.5 mg/dL (ref 8.7–10.3)
Chloride: 104 mmol/L (ref 96–106)
Creatinine, Ser: 1.14 mg/dL — ABNORMAL HIGH (ref 0.57–1.00)
GFR calc Af Amer: 60 mL/min/{1.73_m2} (ref >59)
GFR calc non Af Amer: 52 mL/min/{1.73_m2} — ABNORMAL LOW (ref >59)
Globulin, Total: 3.1 g/dL (ref 1.5–4.5)
Glucose: 104 mg/dL — ABNORMAL HIGH (ref 65–99)
Potassium: 4.4 mmol/L (ref 3.5–5.2)
Sodium: 141 mmol/L (ref 134–144)
Total Protein: 7.2 g/dL (ref 6.0–8.5)

## 2020-06-25 LAB — LIPID PANEL
Chol/HDL Ratio: 4.7 ratio — ABNORMAL HIGH (ref 0.0–4.4)
Cholesterol, Total: 206 mg/dL — ABNORMAL HIGH (ref 100–199)
HDL: 44 mg/dL
LDL Chol Calc (NIH): 147 mg/dL — ABNORMAL HIGH (ref 0–99)
Triglycerides: 83 mg/dL (ref 0–149)
VLDL Cholesterol Cal: 15 mg/dL (ref 5–40)

## 2020-06-25 LAB — VITAMIN D 25 HYDROXY (VIT D DEFICIENCY, FRACTURES): Vit D, 25-Hydroxy: 26.1 ng/mL — ABNORMAL LOW (ref 30.0–100.0)

## 2020-06-25 LAB — TSH: TSH: 3.33 u[IU]/mL (ref 0.450–4.500)

## 2020-06-26 ENCOUNTER — Telehealth: Payer: Self-pay

## 2020-06-26 ENCOUNTER — Other Ambulatory Visit: Payer: Self-pay | Admitting: Family Medicine

## 2020-06-26 MED ORDER — PREDNISONE 5 MG (21) PO TBPK: 5.0000 mg | ORAL_TABLET | ORAL | 0 refills | Status: DC

## 2020-06-26 MED ORDER — GABAPENTIN 100 MG PO CAPS: 100.0000 mg | ORAL_CAPSULE | Freq: Every day | ORAL | 0 refills | Status: DC

## 2020-06-26 MED ORDER — FAMOTIDINE 40 MG PO TABS
40.0000 mg | ORAL_TABLET | Freq: Every day | ORAL | 0 refills | Status: DC
Start: 2020-06-26 — End: 2021-10-07

## 2020-06-26 NOTE — Telephone Encounter (Signed)
Pt evaluated by me, local redness in area of vaccine and c/o pain from injection site up left neck into head hurting at 10  Advise to use gabapentin and prednisone  for nerve pain, and alleve for headache and benadryul for for rash.pls review with patient

## 2020-06-26 NOTE — Telephone Encounter (Signed)
Patient here in office- states she got shingrix Monday in her left arm and has been having pain down that side of her neck and has a red round area at injection site with warmth and pain. Wants me to discuss with Dr and let her know while she waits in the lobby

## 2020-06-26 NOTE — Telephone Encounter (Signed)
Pt evaluated and paper handed with directions

## 2020-06-26 NOTE — Progress Notes (Signed)
pred 5mg 

## 2020-06-27 ENCOUNTER — Encounter: Payer: Self-pay | Admitting: Family Medicine

## 2020-06-27 DIAGNOSIS — Z23 Encounter for immunization: Secondary | ICD-10-CM | POA: Insufficient documentation

## 2020-06-27 NOTE — Assessment & Plan Note (Signed)
After obtaining informed consent, the vaccine is  administered , with no adverse effect noted at the time of administration.  

## 2020-06-27 NOTE — Assessment & Plan Note (Signed)
Increased due to resurgence of covid, pt ventilated for approx 5 mins

## 2020-06-27 NOTE — Assessment & Plan Note (Signed)
Controlled, no change in medication DASH diet and commitment to daily physical activity for a minimum of 30 minutes discussed and encouraged, as a part of hypertension management. The importance of attaining a healthy weight is also discussed.  BP/Weight 06/24/2020 04/17/2020 09/25/2019 08/22/2019 02/01/2019 00/18/0970 02/18/9251  Systolic BP 415 901 724 195 424 814 439  Diastolic BP 79 82 84 70 82 88 88  Wt. (Lbs) 185.12 187 182 180.12 172.12 168 168  BMI 31.78 32.1 31.24 30.92 29.54 28.84 28.84

## 2020-06-27 NOTE — Assessment & Plan Note (Signed)
  Patient re-educated about  the importance of commitment to a  minimum of 150 minutes of exercise per week as able.  The importance of healthy food choices with portion control discussed, as well as eating regularly and within a 12 hour window most days. The need to choose "clean , green" food 50 to 75% of the time is discussed, as well as to make water the primary drink and set a goal of 64 ounces water daily.    Weight /BMI 06/24/2020 04/17/2020 09/25/2019  WEIGHT 185 lb 1.9 oz 187 lb 182 lb  HEIGHT 5\' 4"  5\' 4"  5\' 4"   BMI 31.78 kg/m2 32.1 kg/m2 31.24 kg/m2

## 2020-06-27 NOTE — Assessment & Plan Note (Signed)
Hyperlipidemia:Low fat diet discussed and encouraged.   Lipid Panel  Lab Results  Component Value Date   CHOL 206 (H) 06/24/2020   HDL 44 06/24/2020   LDLCALC 147 (H) 06/24/2020   TRIG 83 06/24/2020   CHOLHDL 4.7 (H) 06/24/2020     Needs to reduce fried and fatty foods

## 2020-07-12 ENCOUNTER — Other Ambulatory Visit: Payer: Self-pay

## 2020-07-12 ENCOUNTER — Ambulatory Visit (HOSPITAL_COMMUNITY)
Admission: RE | Admit: 2020-07-12 | Discharge: 2020-07-12 | Disposition: A | Discharge status: Home / Self Care | Payer: BC Managed Care – PPO | Source: Ambulatory Visit | Attending: Nurse Practitioner | Admitting: Nurse Practitioner

## 2020-07-12 ENCOUNTER — Inpatient Hospital Stay (HOSPITAL_COMMUNITY): Payer: BC Managed Care – PPO | Attending: Nurse Practitioner | Admitting: Nurse Practitioner

## 2020-07-12 DIAGNOSIS — Z87891 Personal history of nicotine dependence: Secondary | ICD-10-CM | POA: Diagnosis not present

## 2020-07-12 DIAGNOSIS — Z122 Encounter for screening for malignant neoplasm of respiratory organs: Secondary | ICD-10-CM | POA: Diagnosis not present

## 2020-07-12 DIAGNOSIS — Z720 Tobacco use: Secondary | ICD-10-CM

## 2020-07-12 DIAGNOSIS — F17210 Nicotine dependence, cigarettes, uncomplicated: Secondary | ICD-10-CM | POA: Diagnosis not present

## 2020-07-12 NOTE — Patient Instructions (Signed)
You were seen today for your shared decision making visit and low dose CT scan.

## 2020-07-12 NOTE — Assessment & Plan Note (Signed)
1.  Tobacco abuse: -This patient meets the criteria for low-dose CT lung cancer screening. -She is asymptomatic for any signs symptoms of lung cancer. -This shared decision making visit discussion included risk and benefits of screening, potential for follow-up, diagnostic testing for abnormal scans, potential for false positive test, over diagnosis and discussion about total radiation exposure. -Patient stated willingness to undergo diagnosis and treatment as needed. -Patient was counseled on smoking cessation to decrease risk of lung cancer, pulmonary disease, heart disease and stroke. -Patient was given a resource card with information on receiving free nicotine replacement therapy and information about free smoking cessation classes. -Patient will present for LD CT scan today and follow-up with PCP.

## 2020-07-12 NOTE — Progress Notes (Signed)
[facility] NOTE  Patient Care Team: Fayrene Helper, MD as PCP - [facility], Marga Melnick, MD (Inactive) as Attending Physician (Gastroenterology)  CHIEF COMPLAINTS/PURPOSE OF CONSULTATION: Shared decision making visit for lung cancer screening  HISTORY OF PRESENTING ILLNESS:  Lydia Perez 64 y.o. female presents today for shared decision making visit for lung cancer screening.  She has a past medical history significant for anemia, bipolar disorder, esophageal stricture, and hiatal hernia.  Patient states she began smoking at age 32.  She smoked 1 pack/day.  She is still currently smoking.  33-pack-year history.  She reports occasional cough without sputum production.  She denies shortness of breath.  She denies any alcohol or drug use.     MEDICAL HISTORY:  Past Medical History:  Diagnosis Date   Anemia    NOS    Bipolar disorder ([place]) 2010   dx by PCP    Esophageal stricture    Hiatal hernia    History of cardiac cath 2002    SURGICAL HISTORY: Past Surgical History:  Procedure Laterality Date   COLONOSCOPY  08/13/2008   SLF: SIMPLE ADENOMA/HYPERPLASTIC POLYP   COLONOSCOPY N/A 05/15/2013   SLF:SIX COLORECTALPOLYPS REMOVED./RECTAL BLEEDING DUE TO Moderate sized internal hemorrhoids   ESOPHAGOGASTRODUODENOSCOPY  07/19/2007   SLF: Normal   ESOPHAGOGASTRODUODENOSCOPY (EGD) WITH ESOPHAGEAL DILATION N/A 05/15/2013   ZOX:WRUE Non-erosive gastritis   HEMORRHOID BANDING N/A 05/15/2013   Procedure: HEMORRHOID BANDING;  Surgeon: Danie Binder, MD;  Location: [facility];  Service: Endoscopy;  Laterality: N/A;   TOTAL ABDOMINAL HYSTERECTOMY W/ BILATERAL SALPINGOOPHORECTOMY  01/2005   TUBAL LIGATION      SOCIAL HISTORY: Social History   Socioeconomic History   Marital status: Married    Spouse name: Not on file   Number of children: 3   Years of education: Not on file   Highest education level: Not on file  Occupational History    Occupation: Engineer, petroleum: [employer]  Tobacco Use   Smoking status: Current Every Day Smoker    Packs/day: 0.50    Years: 40.00    Pack years: 20.00    Types: Cigarettes    Start date: 02/17/1973   Smokeless tobacco: Never Used   Tobacco comment: currently 1/2 PPD as of 03/08/19; quit for a few years at one point.  Substance and Sexual Activity   Alcohol use: No    Alcohol/week: 0.0 standard drinks   Drug use: No    Comment: marijuana    Sexual activity: Yes    Birth control/protection: Surgical  Other Topics Concern   Not on file  Social History Narrative   Not on file   Social Determinants of Health   Financial Resource Strain:    Difficulty of Paying Living Expenses: Not on file  Food Insecurity:    Worried About Charity fundraiser in the Last Year: Not on file   [employer] of Food in the Last Year: Not on file  Transportation Needs:    Lack of Transportation (Medical): Not on file   Lack of Transportation (Non-Medical): Not on file  Physical Activity:    Days of Exercise per Week: Not on file   Minutes of Exercise per Session: Not on file  Stress:    Feeling of Stress : Not on file  Social Connections:    Frequency of Communication with Friends and Family: Not on file   Frequency of Social Gatherings with Friends and Family:  Not on file   Attends Religious Services: Not on file   Active Member of Clubs or Organizations: Not on file   Attends Archivist Meetings: Not on file   Marital Status: Not on file  Intimate Partner Violence:    Fear of Current or Ex-Partner: Not on file   Emotionally Abused: Not on file   Physically Abused: Not on file   Sexually Abused: Not on file    FAMILY HISTORY: Family History  Problem Relation Age of Onset   Emphysema Father        was a smoker   Lung cancer Father        was a smoker   Kidney disease Mother    Heart disease Mother        1st by pass in her 37's     Heart failure Mother    Diabetes Mother    Hypertension Mother    Heart attack Brother    Heart failure Brother    Colon cancer Neg Hx    Colon polyps Neg Hx     ALLERGIES:  is allergic to codeine.  MEDICATIONS:  Current Outpatient Medications  Medication Sig Dispense Refill   amLODipine (NORVASC) 5 MG tablet Take 1 tablet (5 mg total) by mouth daily. 90 tablet 3   buPROPion (ZYBAN) 150 MG 12 hr tablet Take 1 tablet (150 mg total) by mouth 2 (two) times daily. 60 tablet 3   famotidine (PEPCID) 40 MG tablet Take 1 tablet (40 mg total) by mouth daily for 14 days. 14 tablet 0   gabapentin (NEURONTIN) 100 MG capsule Take 1 capsule (100 mg total) by mouth at bedtime. 30 capsule 0   predniSONE (STERAPRED UNI-PAK 21 TAB) 5 MG (21) TBPK tablet Take 1 tablet (5 mg total) by mouth as directed. Use as directed 21 tablet 0   No current facility-administered medications for this visit.    LABORATORY DATA:  I have reviewed the data as listed Lab Results  Component Value Date   WBC 5.5 06/24/2020   HGB 14.8 06/24/2020   HCT 44.8 06/24/2020   MCV 94 06/24/2020   PLT 223 06/24/2020     Chemistry      Component Value Date/Time   NA 141 06/24/2020 1008   K 4.4 06/24/2020 1008   CL 104 06/24/2020 1008   CO2 25 06/24/2020 1008   BUN 13 06/24/2020 1008   CREATININE 1.14 (H) 06/24/2020 1008   CREATININE 0.95 09/27/2019 0808      Component Value Date/Time   CALCIUM 9.5 06/24/2020 1008   ALKPHOS 78 06/24/2020 1008   AST 19 06/24/2020 1008   ALT 11 06/24/2020 1008   BILITOT 0.4 06/24/2020 1008      ASSESSMENT & PLAN:  Tobacco abuse 1.  Tobacco abuse: -This patient meets the criteria for low-dose CT lung cancer screening. -She is asymptomatic for any signs symptoms of lung cancer. -This shared decision making visit discussion included risk and benefits of screening, potential for follow-up, diagnostic testing for abnormal scans, potential for false positive test, over  diagnosis and discussion about total radiation exposure. -Patient stated willingness to undergo diagnosis and treatment as needed. -Patient was counseled on smoking cessation to decrease risk of lung cancer, pulmonary disease, heart disease and stroke. -Patient was given a resource card with information on receiving free nicotine replacement therapy and information about free smoking cessation classes. -Patient will present for LD CT scan today and follow-up with PCP.    All  questions were answered. The patient knows to call the clinic with any problems, questions or concerns. I spent 10 minutes counseling the patient face to face. The total time spent in the appointment was 20 minutes and more than 50% was on counseling.     Glennie Isle, NP-C 07/12/2020 10:41 AM

## 2020-07-16 ENCOUNTER — Encounter (HOSPITAL_COMMUNITY): Payer: Self-pay

## 2020-07-16 NOTE — Progress Notes (Signed)
Patient underwent LDCT through [facility]. PCP aware of results. Patient being mailed results. I will follow-up with patient in approximately one year for follow-up per recommendations. Results are as follows: IMPRESSION: 1. Lung-RADS 2, benign appearance or behavior. Continue annual screening with low-dose chest CT without contrast in 12 months. 2. Aortic atherosclerosis. 3. Mild diffuse bronchial wall thickening with very mild centrilobular and paraseptal emphysema; imaging findings suggestive of underlying COPD.  Aortic Atherosclerosis (ICD10-I70.0) and Emphysema (ICD10-J43.9).

## 2020-07-24 ENCOUNTER — Ambulatory Visit: Payer: BC Managed Care – PPO | Admitting: Family Medicine

## 2020-08-28 ENCOUNTER — Ambulatory Visit: Payer: BC Managed Care – PPO | Admitting: Cardiology

## 2020-10-02 ENCOUNTER — Other Ambulatory Visit: Payer: Self-pay

## 2020-10-02 ENCOUNTER — Ambulatory Visit (INDEPENDENT_AMBULATORY_CARE_PROVIDER_SITE_OTHER): Payer: Self-pay | Admitting: Family Medicine

## 2020-10-02 ENCOUNTER — Encounter: Payer: Self-pay | Admitting: Family Medicine

## 2020-10-02 VITALS — BP 130/81 | HR 62 | Resp 16 | Ht 64.0 in | Wt 204.0 lb

## 2020-10-02 DIAGNOSIS — Z0100 Encounter for examination of eyes and vision without abnormal findings: Secondary | ICD-10-CM

## 2020-10-02 DIAGNOSIS — I10 Essential (primary) hypertension: Secondary | ICD-10-CM

## 2020-10-02 DIAGNOSIS — Z0000 Encounter for general adult medical examination without abnormal findings: Secondary | ICD-10-CM

## 2020-10-02 DIAGNOSIS — R7303 Prediabetes: Secondary | ICD-10-CM

## 2020-10-02 DIAGNOSIS — E785 Hyperlipidemia, unspecified: Secondary | ICD-10-CM

## 2020-10-02 LAB — POCT GLYCOSYLATED HEMOGLOBIN (HGB A1C): Hemoglobin A1C: 5.8 % — AB (ref 4.0–5.6)

## 2020-10-02 MED ORDER — AMLODIPINE BESYLATE 5 MG PO TABS
5.0000 mg | ORAL_TABLET | Freq: Every day | ORAL | 5 refills | Status: DC
Start: 2020-10-02 — End: 2021-01-02

## 2020-10-02 MED ORDER — BUPROPION HCL ER (SR) 100 MG PO TB12
100.0000 mg | ORAL_TABLET | Freq: Two times a day (BID) | ORAL | 4 refills | Status: DC
Start: 2020-10-02 — End: 2020-10-18

## 2020-10-02 NOTE — Patient Instructions (Addendum)
Follow-up in office with MD in 4 months call if you need me sooner.  Congrats on stopping smoking in August 2021.  Fasting lipid and chem 7 and EGFR 1 week before follow up  Start zyban one twice daily to help with cravings now that you have stopped smoking  Blood sugar has increased and you are again prediabetic.  Very important to change food and drink choices so that you will not become diabetic and reduce your risk of heart disease also.  Sheet will be handed to you with picture of a healthy plate of food.  Please commit to eating at set times 3 times per day breakfast lunch and dinner.  Try to make your last meal no later than 7:30 in the evening.  You need to stop sodas so that your reflux and your sugar and your weight will be better also chocolate   Take OTC vitamin D3, 1000 iU once daily  It is important that you exercise regularly at least 30 minutes 5 times a week. If you develop chest pain, have severe difficulty breathing, or feel very tired, stop exercising immediately and seek medical attention  Thanks for choosing [facility], we consider it a privelige to serve you.

## 2020-10-04 ENCOUNTER — Encounter: Payer: Self-pay | Admitting: Family Medicine

## 2020-10-04 NOTE — Assessment & Plan Note (Signed)

## 2020-10-04 NOTE — Progress Notes (Signed)
Lydia Perez     MRN: [id number]      DOB: 08/15/1956  HPI: Patient is in for annual physical exam. Elevated blood sugar , and weight gain are also addressed. Recent labs,  are reviewed. Immunization is reviewed , and  updated if needed.   PE: BP 130/81   Pulse 62   Resp 16   Ht 5\' 4"  (1.626 m)   Wt 204 lb (92.5 kg)   SpO2 97%   BMI 35.02 kg/m   Pleasant  female, alert and oriented x 3, in no cardio-pulmonary distress. Afebrile. HEENT No facial trauma or asymetry. Sinuses non tender.  Extra occullar muscles intact.. External ears normal, . Neck: supple, no adenopathy,JVD or thyromegaly.No bruits.  Chest: Clear to ascultation bilaterally.No crackles or wheezes. Non tender to palpation  Breast: Asymptomatic normal mammogram in 04/2020 No physical exam at visit  Cardiovascular system; Heart sounds normal,  S1 and  S2 ,no S3.  No murmur, or thrill. Apical beat not displaced Peripheral pulses normal.  Abdomen: Soft, non tender, no organomegaly or masses. No bruits. Bowel sounds normal. No guarding, tenderness or rebound.   GU: Asymptomatic, not examined  Musculoskeletal exam: Full ROM of spine, hips , shoulders and knees. No deformity ,swelling or crepitus noted. No muscle wasting or atrophy.   Neurologic: Cranial nerves 2 to 12 intact. Power, tone ,sensation and reflexes normal throughout. No disturbance in gait. No tremor.  Skin: Intact, no ulceration, erythema , scaling or rash noted. Pigmentation normal throughout  Psych; Normal mood and affect. Judgement and concentration normal   Assessment & Plan:  Annual physical exam Annual exam as documented. Counseling done  re healthy lifestyle involving commitment to 150 minutes exercise per week, heart healthy diet, and attaining healthy weight.The importance of adequate sleep also discussed. Regular seat belt use and home safety, is also discussed. Changes in health habits are decided on by  the patient with goals and time frames  set for achieving them. Immunization and cancer screening needs are specifically addressed at this visit.   Morbid obesity ([place]) Obesity linked hypertension   Patient re-educated about  the importance of commitment to a  minimum of 150 minutes of exercise per week as able.  The importance of healthy food choices with portion control discussed, as well as eating regularly and within a 12 hour window most days. The need to choose "clean , green" food 50 to 75% of the time is discussed, as well as to make water the primary drink and set a goal of 64 ounces water daily.    Weight /BMI 10/02/2020 06/24/2020 04/17/2020  WEIGHT 204 lb 185 lb 1.9 oz 187 lb  HEIGHT 5\' 4"  5\' 4"  5\' 4"   BMI 35.02 kg/m2 31.78 kg/m2 32.1 kg/m2      Prediabetes Deteriorated Patient educated about the importance of limiting  Carbohydrate intake , the need to commit to daily physical activity for a minimum of 30 minutes , and to commit weight loss. The fact that changes in all these areas will reduce or eliminate all together the development of diabetes is stressed.   Diabetic Labs Latest Ref Rng & Units 10/02/2020 06/24/2020 09/27/2019 02/15/2017 02/25/2016  HbA1c 4.0 - 5.6 % 5.8(A) - 5.5 - -  Chol 100 - 199 mg/dL - 206(H) - 164 -  HDL >39 mg/dL - 44 - 49(L) -  Calc LDL 0 - 99 mg/dL - 147(H) - 105(H) -  Triglycerides 0 - 149 mg/dL - 83 -  50 -  Creatinine 0.57 - 1.00 mg/dL - 1.14(H) 0.95 0.94 1.10(H)   BP/Weight 10/02/2020 06/24/2020 04/17/2020 09/25/2019 08/22/2019 02/01/2019 01/15/4995  Systolic BP 924 932 419 914 445 848 350  Diastolic BP 81 79 82 84 70 82 88  Wt. (Lbs) 204 185.12 187 182 180.12 172.12 168  BMI 35.02 31.78 32.1 31.24 30.92 29.54 28.84   No flowsheet data found.

## 2020-10-04 NOTE — Assessment & Plan Note (Signed)
Obesity linked hypertension   Patient re-educated about  the importance of commitment to a  minimum of 150 minutes of exercise per week as able.  The importance of healthy food choices with portion control discussed, as well as eating regularly and within a 12 hour window most days. The need to choose "clean , green" food 50 to 75% of the time is discussed, as well as to make water the primary drink and set a goal of 64 ounces water daily.    Weight /BMI 10/02/2020 06/24/2020 04/17/2020  WEIGHT 204 lb 185 lb 1.9 oz 187 lb  HEIGHT 5\' 4"  5\' 4"  5\' 4"   BMI 35.02 kg/m2 31.78 kg/m2 32.1 kg/m2

## 2020-10-04 NOTE — Assessment & Plan Note (Signed)
Deteriorated Patient educated about the importance of limiting  Carbohydrate intake , the need to commit to daily physical activity for a minimum of 30 minutes , and to commit weight loss. The fact that changes in all these areas will reduce or eliminate all together the development of diabetes is stressed.   Diabetic Labs Latest Ref Rng & Units 10/02/2020 06/24/2020 09/27/2019 02/15/2017 02/25/2016  HbA1c 4.0 - 5.6 % 5.8(A) - 5.5 - -  Chol 100 - 199 mg/dL - 206(H) - 164 -  HDL >39 mg/dL - 44 - 49(L) -  Calc LDL 0 - 99 mg/dL - 147(H) - 105(H) -  Triglycerides 0 - 149 mg/dL - 83 - 50 -  Creatinine 0.57 - 1.00 mg/dL - 1.14(H) 0.95 0.94 1.10(H)   BP/Weight 10/02/2020 06/24/2020 04/17/2020 09/25/2019 08/22/2019 02/01/2019 29/92/4268  Systolic BP 341 962 229 798 921 194 174  Diastolic BP 81 79 82 84 70 82 88  Wt. (Lbs) 204 185.12 187 182 180.12 172.12 168  BMI 35.02 31.78 32.1 31.24 30.92 29.54 28.84   No flowsheet data found.

## 2020-10-18 ENCOUNTER — Ambulatory Visit (INDEPENDENT_AMBULATORY_CARE_PROVIDER_SITE_OTHER): Payer: BC Managed Care – PPO | Admitting: Cardiology

## 2020-10-18 ENCOUNTER — Other Ambulatory Visit: Payer: Self-pay

## 2020-10-18 ENCOUNTER — Encounter: Payer: Self-pay | Admitting: Cardiology

## 2020-10-18 VITALS — BP 132/98 | HR 69 | Ht 64.0 in | Wt 207.0 lb

## 2020-10-18 DIAGNOSIS — R0789 Other chest pain: Secondary | ICD-10-CM | POA: Diagnosis not present

## 2020-10-18 NOTE — Progress Notes (Signed)
Clinical Summary Lydia Perez is a 64 y.o.female seen as new consult, referred by Dr Moshe Cipro for the following medical problems.    1. Chest pain - some recent chest pain last few months - tightness midchest/epigastrium, 5/10 in severity. Usually occurs with laying down. No other assocaited symptoms. Not positional. Lasts a few minutes. Can be better with leaning forward or standing up - walks dog up 1 hour, no exertional symptoms. No DOE or DOB. - no longer on antacids.    CAD risk factors: former smoker, HTN, mother CABG in her 96s     Past Medical History:  Diagnosis Date  . Anemia    NOS   . Bipolar disorder ([place]) 2010   dx by PCP   . Esophageal stricture   . Hiatal hernia   . History of cardiac cath 2002     Allergies  Allergen Reactions  . Codeine Other (See Comments)    Hallucinations     Current Outpatient Medications  Medication Sig Dispense Refill  . amLODipine (NORVASC) 5 MG tablet Take 1 tablet (5 mg total) by mouth daily. 30 tablet 5  . buPROPion (WELLBUTRIN SR) 100 MG 12 hr tablet Take 1 tablet (100 mg total) by mouth 2 (two) times daily. 60 tablet 4  . famotidine (PEPCID) 40 MG tablet Take 1 tablet (40 mg total) by mouth daily for 14 days. 14 tablet 0   No current facility-administered medications for this visit.     Past Surgical History:  Procedure Laterality Date  . COLONOSCOPY  08/13/2008   SLF: SIMPLE ADENOMA/HYPERPLASTIC POLYP  . COLONOSCOPY N/A 05/15/2013   SLF:SIX COLORECTALPOLYPS REMOVED./RECTAL BLEEDING DUE TO Moderate sized internal hemorrhoids  . ESOPHAGOGASTRODUODENOSCOPY  07/19/2007   SLF: Normal  . ESOPHAGOGASTRODUODENOSCOPY (EGD) WITH ESOPHAGEAL DILATION N/A 05/15/2013   MEQ:ASTM Non-erosive gastritis  . HEMORRHOID BANDING N/A 05/15/2013   Procedure: HEMORRHOID BANDING;  Surgeon: Danie Binder, MD;  [facility];  Service: Endoscopy;  Laterality: N/A;  . TOTAL ABDOMINAL HYSTERECTOMY W/ BILATERAL  SALPINGOOPHORECTOMY  01/2005  . TUBAL LIGATION       Allergies  Allergen Reactions  . Codeine Other (See Comments)    Hallucinations      Family History  Problem Relation Age of Onset  . Emphysema Father        was a smoker  . Lung cancer Father        was a smoker  . Kidney disease Mother   . Heart disease Mother        1st by pass in her 64's   . Heart failure Mother   . Diabetes Mother   . Hypertension Mother   . Heart attack Brother   . Heart failure Brother   . Colon cancer Neg Hx   . Colon polyps Neg Hx      Social History [name] reports that she has quit smoking. Her smoking use included cigarettes. She started smoking about 47 years ago. She has a 20.00 pack-year smoking history. She has never used smokeless tobacco. [name] reports no history of alcohol use.   Review of Systems CONSTITUTIONAL: No weight loss, fever, chills, weakness or fatigue.  HEENT: Eyes: No visual loss, blurred vision, double vision or yellow sclerae.No hearing loss, sneezing, congestion, runny nose or sore throat.  SKIN: No rash or itching.  CARDIOVASCULAR: per hpi RESPIRATORY: No shortness of breath, cough or sputum.  GASTROINTESTINAL: No anorexia, nausea, vomiting or diarrhea. No abdominal pain or blood.  GENITOURINARY: No burning on urination, no polyuria NEUROLOGICAL: No headache, dizziness, syncope, paralysis, ataxia, numbness or tingling in the extremities. No change in bowel or bladder control.  MUSCULOSKELETAL: No muscle, back pain, joint pain or stiffness.  LYMPHATICS: No enlarged nodes. No history of splenectomy.  PSYCHIATRIC: No history of depression or anxiety.  ENDOCRINOLOGIC: No reports of sweating, cold or heat intolerance. No polyuria or polydipsia.  Marland Kitchen   Physical Examination Today's Vitals   10/18/20 1046  BP: (!) 132/98  Pulse: 69  SpO2: 96%  Weight: 207 lb (93.9 kg)  Height: 5\' 4"  (1.626 m)   Body mass index is 35.53 kg/m.  Gen: resting  comfortably, no acute distress HEENT: no scleral icterus, pupils equal round and reactive, no palptable cervical adenopathy,  CV: RRR, no m/r/g, no jvd Resp: Clear to auscultation bilaterally GI: abdomen is soft, non-tender, non-distended, normal bowel sounds, no hepatosplenomegaly MSK: extremities are warm, no edema.  Skin: warm, no rash Neuro:  no focal deficits Psych: appropriate affect    Assessment and Plan  1. Chest pain - noncardiac symptoms, occur with laying down. Pain midchest/epigastrium, better with laying forward. Prior esophageal issues. Symptoms most consistent with GERD/reflux. - if progression would consider trial of daily antacid - no plans for ischemic testing at this time,  if symptoms change or become exerational would reconsider  - baselien EKG shows SR, no ischemic changes     Arnoldo Lenis, M.D.

## 2020-10-18 NOTE — Patient Instructions (Signed)
Medication Instructions:  Your physician recommends that you continue on your current medications as directed. Please refer to the Current Medication list given to you today.  *If you need a refill on your cardiac medications before your next appointment, please call your pharmacy*   Lab Work: None today If you have labs (blood work) drawn today and your tests are completely normal, you will receive your results only by: . MyChart Message (if you have MyChart) OR . A paper copy in the mail If you have any lab test that is abnormal or we need to change your treatment, we will call you to review the results.   Testing/Procedures: None today   Follow-Up: At [facility], you and your health needs are our priority.  As part of our continuing mission to provide you with exceptional heart care, we have created designated Provider Care Teams.  These Care Teams include your primary Cardiologist (physician) and Advanced Practice Providers (APPs -  Physician Assistants and Nurse Practitioners) who all work together to provide you with the care you need, when you need it.  We recommend signing up for the patient portal called "MyChart".  Sign up information is provided on this After Visit Summary.  MyChart is used to connect with patients for Virtual Visits (Telemedicine).  Patients are able to view lab/test results, encounter notes, upcoming appointments, etc.  Non-urgent messages can be sent to your provider as well.   To learn more about what you can do with MyChart, go to [email or web address].    Your next appointment:  As needed      Thank you for choosing [facility] !        

## 2020-11-21 ENCOUNTER — Ambulatory Visit
Admission: EM | Admit: 2020-11-21 | Discharge: 2020-11-21 | Disposition: A | Discharge status: Home / Self Care | Payer: BC Managed Care – PPO | Attending: Emergency Medicine | Admitting: Emergency Medicine

## 2020-11-21 ENCOUNTER — Other Ambulatory Visit: Payer: Self-pay

## 2020-11-21 ENCOUNTER — Encounter: Payer: Self-pay | Admitting: Emergency Medicine

## 2020-11-21 DIAGNOSIS — R059 Cough, unspecified: Secondary | ICD-10-CM | POA: Diagnosis not present

## 2020-11-21 DIAGNOSIS — J069 Acute upper respiratory infection, unspecified: Secondary | ICD-10-CM

## 2020-11-21 DIAGNOSIS — Z1152 Encounter for screening for COVID-19: Secondary | ICD-10-CM | POA: Diagnosis not present

## 2020-11-21 DIAGNOSIS — R509 Fever, unspecified: Secondary | ICD-10-CM | POA: Diagnosis not present

## 2020-11-21 MED ORDER — CETIRIZINE HCL 10 MG PO TABS: 10.0000 mg | ORAL_TABLET | Freq: Every day | ORAL | 0 refills | Status: DC

## 2020-11-21 MED ORDER — ACETAMINOPHEN 325 MG PO TABS
650.0000 mg | ORAL_TABLET | Freq: Once | ORAL | Status: AC
  Administered 2020-11-21: 650 mg via ORAL

## 2020-11-21 MED ORDER — FLUTICASONE PROPIONATE 50 MCG/ACT NA SUSP: 1.0000 | Freq: Every day | NASAL | 0 refills | Status: DC

## 2020-11-21 MED ORDER — DEXAMETHASONE 4 MG PO TABS
4.0000 mg | ORAL_TABLET | Freq: Every day | ORAL | 0 refills | Status: AC
Start: 2020-11-21 — End: 2020-11-28

## 2020-11-21 MED ORDER — BENZONATATE 100 MG PO CAPS
100.0000 mg | ORAL_CAPSULE | Freq: Three times a day (TID) | ORAL | 0 refills | Status: DC
Start: 2020-11-21 — End: 2021-10-07

## 2020-11-21 NOTE — ED Provider Notes (Signed)
[facility]   [id number] 11/21/20 Arrival Time: 1216   CC: COVID symptoms  SUBJECTIVE: History from: patient.  Lydia Perez is a 65 y.o. female who presented to the urgent care for complaint of chills, fever, fatigue and cough for the past 2 days.  Denies sick exposure to COVID, flu or strep.  Denies recent travel.  Has tried OTC medication without relief.  Denies review aggravating factors.  Denies previous symptoms in the past.   Denies , sinus pain, rhinorrhea, sore throat, SOB, wheezing, chest pain, nausea, changes in bowel or bladder habits.    ROS: As per HPI.  All other pertinent ROS negative.      Past Medical History:  Diagnosis Date  . Anemia    NOS   . Bipolar disorder (HCC) 2010   dx by PCP   . COPD (chronic obstructive pulmonary disease) (HCC)   . Esophageal stricture   . Hiatal hernia   . History of cardiac cath 2002   Past Surgical History:  Procedure Laterality Date  . COLONOSCOPY  08/13/2008   SLF: SIMPLE ADENOMA/HYPERPLASTIC POLYP  . COLONOSCOPY N/A 05/15/2013   SLF:SIX COLORECTALPOLYPS REMOVED./RECTAL BLEEDING DUE TO Moderate sized internal hemorrhoids  . ESOPHAGOGASTRODUODENOSCOPY  07/19/2007   SLF: Normal  . ESOPHAGOGASTRODUODENOSCOPY (EGD) WITH ESOPHAGEAL DILATION N/A 05/15/2013   OEU:MPNT Non-erosive gastritis  . HEMORRHOID BANDING N/A 05/15/2013   Procedure: HEMORRHOID BANDING;  Surgeon: West Bali, MD;  Location: [facility];  Service: Endoscopy;  Laterality: N/A;  . TOTAL ABDOMINAL HYSTERECTOMY W/ BILATERAL SALPINGOOPHORECTOMY  01/2005  . TUBAL LIGATION     Allergies  Allergen Reactions  . Codeine Other (See Comments)    Hallucinations   No current facility-administered medications on file prior to encounter.   Current Outpatient Medications on File Prior to Encounter  Medication Sig Dispense Refill  . amLODipine (NORVASC) 5 MG tablet Take 1 tablet (5 mg total) by mouth daily. 30 tablet 5  . famotidine (PEPCID) 40 MG  tablet Take 1 tablet (40 mg total) by mouth daily for 14 days. 14 tablet 0   Social History   Socioeconomic History  . Marital status: Married    Spouse name: Not on file  . Number of children: 3  . Years of education: Not on file  . Highest education level: Not on file  Occupational History  . Occupation: Chief Executive Officer: [employer]  Tobacco Use  . Smoking status: Former Smoker    Packs/day: 0.50    Years: 40.00    Pack years: 20.00    Types: Cigarettes    Start date: 02/17/1973  . Smokeless tobacco: Never Used  . Tobacco comment: stopped Jul 15 2020  Substance and Sexual Activity  . Alcohol use: No    Alcohol/week: 0.0 standard drinks  . Drug use: No    Comment: marijuana   . Sexual activity: Yes    Birth control/protection: Surgical  Other Topics Concern  . Not on file  Social History Narrative  . Not on file   Social Determinants of Health   Financial Resource Strain: Not on file  Food Insecurity: Not on file  Transportation Needs: Not on file  Physical Activity: Not on file  Stress: Not on file  Social Connections: Not on file  Intimate Partner Violence: Not on file   Family History  Problem Relation Age of Onset  . Emphysema Father        was a smoker  . Lung cancer  Father        was a smoker  . Kidney disease Mother   . Heart disease Mother        1st by pass in her 73's   . Heart failure Mother   . Diabetes Mother   . Hypertension Mother   . Heart attack Brother   . Heart failure Brother   . Colon cancer Neg Hx   . Colon polyps Neg Hx     OBJECTIVE:  Vitals:   11/21/20 1300 11/21/20 1302  BP:  (!) 151/92  Pulse:  92  Resp:  18  Temp:  (!) 100.6 F (38.1 C)  TempSrc:  Oral  SpO2:  92%  Weight: 202 lb (91.6 kg)   Height: 5\' 4"  (1.626 m)      General appearance: alert; appears fatigued, but nontoxic; speaking in full sentences and tolerating own secretions HEENT: NCAT; Ears: EACs clear, TMs pearly gray; Eyes: PERRL.  EOM  grossly intact. Sinuses: nontender; Nose: nares patent without rhinorrhea, Throat: oropharynx clear, tonsils non erythematous or enlarged, uvula midline  Neck: supple without LAD Lungs: unlabored respirations, symmetrical air entry; cough: moderate; no respiratory distress; CTAB Heart: regular rate and rhythm.  Radial pulses 2+ symmetrical bilaterally Skin: warm and dry Psychological: alert and cooperative; normal mood and affect  LABS:  No results found for this or any previous visit (from the past 24 hour(s)).   ASSESSMENT & PLAN:  1. Viral URI with cough   2. Chills with fever   3. Encounter for screening for COVID-19     Meds ordered this encounter  Medications  . acetaminophen (TYLENOL) tablet 650 mg  . dexamethasone (DECADRON) 4 MG tablet    Sig: Take 1 tablet (4 mg total) by mouth daily for 7 days.    Dispense:  7 tablet    Refill:  0  . fluticasone (FLONASE) 50 MCG/ACT nasal spray    Sig: Place 1 spray into both nostrils daily for 14 days.    Dispense:  16 g    Refill:  0  . cetirizine (ZYRTEC ALLERGY) 10 MG tablet    Sig: Take 1 tablet (10 mg total) by mouth daily.    Dispense:  30 tablet    Refill:  0  . benzonatate (TESSALON) 100 MG capsule    Sig: Take 1 capsule (100 mg total) by mouth every 8 (eight) hours.    Dispense:  30 capsule    Refill:  0   Discharge Instructions  COVID testing ordered.  It will take between 2-7 days for test results.  Someone will contact you regarding abnormal results.    Get plenty of rest and push fluids Tessalon Perles prescribed for cough Zyrtec for nasal congestion, runny nose, and/or sore throat Flonase for nasal congestion and runny nose Decadron was prescribed Use medications daily for symptom relief Use OTC medications like ibuprofen or tylenol as needed fever or pain Call or go to the ED if you have any new or worsening symptoms such as fever, worsening cough, shortness of breath, chest tightness, chest pain, turning  blue, changes in mental status, etc...   Reviewed expectations re: course of current medical issues. Questions answered. Outlined signs and symptoms indicating need for more acute intervention. Patient verbalized understanding. After Visit Summary given.         Emerson Monte, FNP 11/21/20 1325

## 2020-11-21 NOTE — Discharge Instructions (Addendum)
COVID testing ordered.  It will take between 2-7 days for test results.  Someone will contact you regarding abnormal results.    Get plenty of rest and push fluids Tessalon Perles prescribed for cough Zyrtec for nasal congestion, runny nose, and/or sore throat Flonase for nasal congestion and runny nose Decadron was prescribed Use medications daily for symptom relief Use OTC medications like ibuprofen or tylenol as needed fever or pain Call or go to the ED if you have any new or worsening symptoms such as fever, worsening cough, shortness of breath, chest tightness, chest pain, turning blue, changes in mental status, etc...  

## 2020-11-21 NOTE — ED Triage Notes (Signed)
Cough, chills and fatigue since Tuesday.

## 2020-11-26 LAB — COVID-19, FLU A+B NAA
Influenza A, NAA: NOT DETECTED
Influenza B, NAA: NOT DETECTED
SARS-CoV-2, NAA: DETECTED — AB

## 2021-01-02 ENCOUNTER — Other Ambulatory Visit: Payer: Self-pay

## 2021-01-02 MED ORDER — AMLODIPINE BESYLATE 5 MG PO TABS: 5.0000 mg | ORAL_TABLET | Freq: Every day | ORAL | 5 refills | Status: DC

## 2021-01-30 ENCOUNTER — Telehealth: Payer: BC Managed Care – PPO | Admitting: Family Medicine

## 2021-01-30 ENCOUNTER — Ambulatory Visit: Payer: BC Managed Care – PPO | Admitting: Family Medicine

## 2021-07-23 ENCOUNTER — Other Ambulatory Visit: Payer: Self-pay

## 2021-07-23 ENCOUNTER — Telehealth: Payer: Self-pay

## 2021-07-23 DIAGNOSIS — Z1231 Encounter for screening mammogram for malignant neoplasm of breast: Secondary | ICD-10-CM

## 2021-07-23 NOTE — Telephone Encounter (Signed)
Order entered

## 2021-07-23 NOTE — Telephone Encounter (Signed)
Patient called requesting the date of her mammogram but I dont see a order for this can you enter this for me ? Pt's# [phone]

## 2021-07-30 ENCOUNTER — Ambulatory Visit (HOSPITAL_COMMUNITY)
Admission: RE | Admit: 2021-07-30 | Discharge: 2021-07-30 | Disposition: A | Discharge status: Home / Self Care | Payer: BC Managed Care – PPO | Source: Ambulatory Visit | Attending: Family Medicine | Admitting: Family Medicine

## 2021-07-30 ENCOUNTER — Other Ambulatory Visit: Payer: Self-pay

## 2021-07-30 DIAGNOSIS — Z1231 Encounter for screening mammogram for malignant neoplasm of breast: Secondary | ICD-10-CM | POA: Diagnosis not present

## 2021-09-10 ENCOUNTER — Encounter (HOSPITAL_COMMUNITY): Payer: Self-pay

## 2021-09-10 NOTE — Progress Notes (Signed)
Attempted to reach patient regarding follow-up LDCT. Unable to reach patient, detailed VM left asking that the patient return my call.

## 2021-10-07 ENCOUNTER — Other Ambulatory Visit: Payer: Self-pay

## 2021-10-07 ENCOUNTER — Ambulatory Visit (INDEPENDENT_AMBULATORY_CARE_PROVIDER_SITE_OTHER): Payer: BC Managed Care – PPO | Admitting: Family Medicine

## 2021-10-07 ENCOUNTER — Encounter: Payer: Self-pay | Admitting: Family Medicine

## 2021-10-07 VITALS — BP 149/89 | HR 94 | Temp 98.3°F | Resp 16 | Ht 64.0 in | Wt 193.1 lb

## 2021-10-07 DIAGNOSIS — R509 Fever, unspecified: Secondary | ICD-10-CM | POA: Diagnosis not present

## 2021-10-07 DIAGNOSIS — Z20822 Contact with and (suspected) exposure to covid-19: Secondary | ICD-10-CM | POA: Diagnosis not present

## 2021-10-07 DIAGNOSIS — R051 Acute cough: Secondary | ICD-10-CM

## 2021-10-07 DIAGNOSIS — R5383 Other fatigue: Secondary | ICD-10-CM | POA: Diagnosis not present

## 2021-10-07 LAB — POCT INFLUENZA A/B
Influenza A, POC: NEGATIVE
Influenza B, POC: NEGATIVE

## 2021-10-07 NOTE — Patient Instructions (Signed)
Annual with pap reschedule please in December or January  Work excuse to return 10/13/2021  You will be contacted with result of covid test, I believe this is positive  Self isolate and use tylenol for pain and fever, and ensure a lot of fluids and rest.  Congrats on nicotine free!  Thanks for choosing [facility], we consider it a privelige to serve you.

## 2021-10-09 LAB — NOVEL CORONAVIRUS, NAA: SARS-CoV-2, NAA: DETECTED — AB

## 2021-10-09 LAB — SARS-COV-2, NAA 2 DAY TAT

## 2021-10-10 ENCOUNTER — Encounter: Payer: Self-pay | Admitting: Family Medicine

## 2021-10-13 ENCOUNTER — Encounter: Payer: Self-pay | Admitting: Family Medicine

## 2021-10-13 DIAGNOSIS — R051 Acute cough: Secondary | ICD-10-CM | POA: Insufficient documentation

## 2021-10-13 NOTE — Progress Notes (Signed)
   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 Denies sinus pressure, nasal congestion, ear pain or sore throat. Denies chest congestion, productive cough or wheezing. Denies chest pains, palpitations and leg swelling Denies abdominal pain, nausea, vomiting,diarrhea or constipation.   Denies skin break down or rash.   PE  BP (!) 149/89   Pulse 94   Temp 98.3 F (36.8 C) (Temporal)   Resp 16   Ht 5\' 4"  (1.626 m)   Wt 193 lb 1.9 oz (87.6 kg)   SpO2 95%   BMI 33.15 kg/m   Patient alert and oriented afebrile and in no cardiopulmonary distress.Acutely ill appearing  HEENT: No facial asymmetry, EOMI,     Neck supple .  Chest: Clear to auscultation bilaterally.  CVS: S1, S2 no murmurs, no S3.Regular rate.  ABD: Soft non tender.   Ext: No edema  MS: Adequate ROM spine, shoulders, hips and knees.  Skin: Intact, no ulcerations or rash noted.  Psych: Good eye contact, normal affect. Memory intact not anxious or depressed appearing.  CNS: CN 2-12 intact, power,  normal throughout.no focal deficits noted.   Assessment & Plan  Close exposure to COVID-19 virus Symptoms c/w covid infection, PCR test sent , and she is to remain isolated with symptomatic treatment only, if deteriorates , then paxlovid will be prescribed if positive  Fever Acute feverwith cough and myalgia, likely covid, tylenol and rest pending results   Acute cough New illness , likely viral and covid, self isolate pending test result and symptomatic treatment

## 2021-10-13 NOTE — Assessment & Plan Note (Signed)
New illness , likely viral and covid, self isolate pending test result and symptomatic treatment

## 2021-10-13 NOTE — Assessment & Plan Note (Signed)
Symptoms c/w covid infection, PCR test sent , and she is to remain isolated with symptomatic treatment only, if deteriorates , then paxlovid will be prescribed if positive

## 2021-10-13 NOTE — Assessment & Plan Note (Signed)
Acute feverwith cough and myalgia, likely covid, tylenol and rest pending results

## 2021-12-23 ENCOUNTER — Encounter (HOSPITAL_COMMUNITY): Payer: Self-pay

## 2021-12-23 NOTE — Progress Notes (Signed)
Attempted to reach patient regarding follow-up LDCT. Unable to reach patient, detailed VM left asking that the patient return my call.

## 2021-12-24 ENCOUNTER — Other Ambulatory Visit (HOSPITAL_COMMUNITY): Payer: Self-pay

## 2021-12-24 DIAGNOSIS — Z122 Encounter for screening for malignant neoplasm of respiratory organs: Secondary | ICD-10-CM

## 2021-12-24 DIAGNOSIS — Z87891 Personal history of nicotine dependence: Secondary | ICD-10-CM

## 2021-12-24 NOTE — Progress Notes (Signed)
Order placed for LDCT. LDCT scheduled for 02/06/22 at 0900.

## 2021-12-26 ENCOUNTER — Encounter: Payer: BC Managed Care – PPO | Admitting: Family Medicine

## 2022-01-09 ENCOUNTER — Encounter: Payer: BC Managed Care – PPO | Admitting: Family Medicine

## 2022-02-06 ENCOUNTER — Ambulatory Visit (HOSPITAL_COMMUNITY): Payer: BC Managed Care – PPO

## 2022-04-16 ENCOUNTER — Ambulatory Visit (INDEPENDENT_AMBULATORY_CARE_PROVIDER_SITE_OTHER): Payer: BC Managed Care – PPO | Admitting: Family Medicine

## 2022-04-16 ENCOUNTER — Encounter: Payer: Self-pay | Admitting: Family Medicine

## 2022-04-16 ENCOUNTER — Other Ambulatory Visit (HOSPITAL_COMMUNITY)
Admission: RE | Admit: 2022-04-16 | Discharge: 2022-04-16 | Disposition: A | Discharge status: Home / Self Care | Payer: BC Managed Care – PPO | Source: Ambulatory Visit | Attending: Family Medicine | Admitting: Family Medicine

## 2022-04-16 VITALS — BP 150/93 | HR 80 | Resp 16 | Ht 65.5 in | Wt 190.1 lb

## 2022-04-16 DIAGNOSIS — Z1231 Encounter for screening mammogram for malignant neoplasm of breast: Secondary | ICD-10-CM | POA: Diagnosis not present

## 2022-04-16 DIAGNOSIS — Z23 Encounter for immunization: Secondary | ICD-10-CM | POA: Diagnosis not present

## 2022-04-16 DIAGNOSIS — R051 Acute cough: Secondary | ICD-10-CM | POA: Diagnosis not present

## 2022-04-16 DIAGNOSIS — I10 Essential (primary) hypertension: Secondary | ICD-10-CM

## 2022-04-16 DIAGNOSIS — E559 Vitamin D deficiency, unspecified: Secondary | ICD-10-CM

## 2022-04-16 DIAGNOSIS — E785 Hyperlipidemia, unspecified: Secondary | ICD-10-CM | POA: Diagnosis not present

## 2022-04-16 DIAGNOSIS — Z124 Encounter for screening for malignant neoplasm of cervix: Secondary | ICD-10-CM

## 2022-04-16 DIAGNOSIS — Z0001 Encounter for general adult medical examination with abnormal findings: Secondary | ICD-10-CM

## 2022-04-16 DIAGNOSIS — R7303 Prediabetes: Secondary | ICD-10-CM | POA: Diagnosis not present

## 2022-04-16 DIAGNOSIS — Z1272 Encounter for screening for malignant neoplasm of vagina: Secondary | ICD-10-CM | POA: Diagnosis not present

## 2022-04-16 DIAGNOSIS — Z0000 Encounter for general adult medical examination without abnormal findings: Secondary | ICD-10-CM

## 2022-04-16 MED ORDER — BENZONATATE 100 MG PO CAPS: 100.0000 mg | ORAL_CAPSULE | Freq: Two times a day (BID) | ORAL | 0 refills | Status: DC | PRN

## 2022-04-16 MED ORDER — AMLODIPINE BESYLATE 10 MG PO TABS: 10.0000 mg | ORAL_TABLET | Freq: Every day | ORAL | 5 refills | Status: DC

## 2022-04-16 MED ORDER — AMLODIPINE BESYLATE 5 MG PO TABS: 5.0000 mg | ORAL_TABLET | Freq: Every day | ORAL | 5 refills | Status: DC

## 2022-04-16 NOTE — Assessment & Plan Note (Signed)
Lingering dry cough from recent URI, tessaLON PERLES AND LIQUIDS

## 2022-04-16 NOTE — Assessment & Plan Note (Signed)

## 2022-04-16 NOTE — Patient Instructions (Addendum)
F/u end August re evaluate blood pressure and for flu vaccine , call if you need me sooner  TdAP today  Please schedule chest scan for lung cancer screening  Higher dose of amlodipine to start , stop amlodipine 5 mg , new dose 10 mg one daily is sent to you r pharmacy to [place] up today, start tomorrow if you have already take the 5 mg tab today  Medication is prescribed for cough  Please schedule mammogram at checkout  Fasting labs today, CBC, lipid, cmp and eGFR, hBA1C, TSH and vit D  Please work on managing stress without relying on cigarettes, now smoking 5/day, cut down each month by even 1 cigarette and you will soon have quit again!

## 2022-04-17 LAB — LIPID PANEL
Chol/HDL Ratio: 5.1 ratio — ABNORMAL HIGH (ref 0.0–4.4)
Cholesterol, Total: 214 mg/dL — ABNORMAL HIGH (ref 100–199)
HDL: 42 mg/dL (ref >39)
LDL Chol Calc (NIH): 152 mg/dL — ABNORMAL HIGH (ref 0–99)
Triglycerides: 109 mg/dL (ref 0–149)
VLDL Cholesterol Cal: 20 mg/dL (ref 5–40)

## 2022-04-17 LAB — CMP14+EGFR
ALT: 10 IU/L (ref 0–32)
AST: 17 IU/L (ref 0–40)
Albumin/Globulin Ratio: 1.4 (ref 1.2–2.2)
Albumin: 4.5 g/dL (ref 3.8–4.8)
Alkaline Phosphatase: 81 IU/L (ref 44–121)
BUN/Creatinine Ratio: 10 — ABNORMAL LOW (ref 12–28)
BUN: 10 mg/dL (ref 8–27)
Bilirubin Total: 0.4 mg/dL (ref 0.0–1.2)
CO2: 24 mmol/L (ref 20–29)
Calcium: 9.6 mg/dL (ref 8.7–10.3)
Chloride: 101 mmol/L (ref 96–106)
Creatinine, Ser: 0.98 mg/dL (ref 0.57–1.00)
Globulin, Total: 3.3 g/dL (ref 1.5–4.5)
Glucose: 98 mg/dL (ref 70–99)
Potassium: 4 mmol/L (ref 3.5–5.2)
Sodium: 141 mmol/L (ref 134–144)
Total Protein: 7.8 g/dL (ref 6.0–8.5)
eGFR: 65 mL/min/{1.73_m2} (ref >59)

## 2022-04-17 LAB — CBC
Hematocrit: 43.8 % (ref 34.0–46.6)
Hemoglobin: 14.7 g/dL (ref 11.1–15.9)
MCH: 31.5 pg (ref 26.6–33.0)
MCHC: 33.6 g/dL (ref 31.5–35.7)
MCV: 94 fL (ref 79–97)
Platelets: 267 10*3/uL (ref 150–450)
RBC: 4.66 x10E6/uL (ref 3.77–5.28)
RDW: 12.6 % (ref 11.7–15.4)
WBC: 6.7 10*3/uL (ref 3.4–10.8)

## 2022-04-17 LAB — HEMOGLOBIN A1C
Est. average glucose Bld gHb Est-mCnc: 114 mg/dL
Hgb A1c MFr Bld: 5.6 % (ref 4.8–5.6)

## 2022-04-17 LAB — TSH: TSH: 2.28 u[IU]/mL (ref 0.450–4.500)

## 2022-04-17 LAB — VITAMIN D 25 HYDROXY (VIT D DEFICIENCY, FRACTURES): Vit D, 25-Hydroxy: 20.1 ng/mL — ABNORMAL LOW (ref 30.0–100.0)

## 2022-04-20 ENCOUNTER — Other Ambulatory Visit: Payer: Self-pay

## 2022-04-20 ENCOUNTER — Encounter: Payer: Self-pay | Admitting: Family Medicine

## 2022-04-20 MED ORDER — ROSUVASTATIN CALCIUM 10 MG PO TABS: 10.0000 mg | ORAL_TABLET | Freq: Every day | ORAL | 3 refills | Status: DC

## 2022-04-20 NOTE — Progress Notes (Signed)
    Lydia Perez     MRN: [id number]      DOB: 1956-05-16  HPI: Patient is in for annual physical exam. 1 week h/o cough, had yellow sputum , but clearing, no fever or chills Fell while getting out of bed to bathroom 2 months ago, no clear recall of lOC, no new memory loss noted Uncontrolled blood pressure is addressed Immunization is reviewed , and  updated if needed.   PE: BP (!) 150/93   Pulse 80   Resp 16   Ht 5' 5.5" (1.664 m)   Wt 190 lb 1.9 oz (86.2 kg)   SpO2 96%   BMI 31.16 kg/m   Pleasant  female, alert and oriented x 3, in no cardio-pulmonary distress. Afebrile. HEENT No facial trauma or asymetry. Sinuses non tender.  Extra occullar muscles intact.. External ears normal, . Neck: supple, no adenopathy,JVD or thyromegaly.No bruits.  Chest: Clear to ascultation bilaterally.No crackles or wheezes. Non tender to palpation  Breast: No asymetry,no masses or lumps. No tenderness. No nipple discharge or inversion. No axillary or supraclavicular adenopathy  Cardiovascular system; Heart sounds normal,  S1 and  S2 ,no S3.  No murmur, or thrill. Apical beat not displaced Peripheral pulses normal.  Abdomen: Soft, non tender, no organomegaly or masses. No bruits. Bowel sounds normal. No guarding, tenderness or rebound.   GU: External genitalia normal female genitalia , normal female distribution of hair. No lesions. Urethral meatus normal in size, no  Prolapse, no lesions visibly  Present. Bladder non tender. Vagina pink and moist , with no visible lesions , discharge present . Adequate pelvic support no  cystocele or rectocele noted  Uterus absent  no adnexal masses, noadnexal tenderness.   Musculoskeletal exam: Full ROM of spine, hips , shoulders and knees. No deformity ,swelling or crepitus noted. No muscle wasting or atrophy.   Neurologic: Cranial nerves 2 to 12 intact. Power, tone ,sensation and reflexes normal throughout. No disturbance in  gait. No tremor.  Skin: Intact, no ulceration, erythema , scaling or rash noted. Pigmentation normal throughout  Psych; Normal mood and affect. Judgement and concentration normal   Assessment & Plan:  Annual physical exam Annual exam as documented. Counseling done  re healthy lifestyle involving commitment to 150 minutes exercise per week, heart healthy diet, and attaining healthy weight.The importance of adequate sleep also discussed. Regular seat belt use and home safety, is also discussed. Changes in health habits are decided on by the patient with goals and time frames  set for achieving them. Immunization and cancer screening needs are specifically addressed at this visit.   Acute cough Lingering dry cough from recent URI, tessaLON PERLES AND LIQUIDS  Essential hypertension Uncontrolled, higher dose amlodipine prescribed DASH diet and commitment to daily physical activity for a minimum of 30 minutes discussed and encouraged, as a part of hypertension management. The importance of attaining a healthy weight is also discussed.     04/16/2022    1:25 PM 10/07/2021    2:18 PM 11/21/2020    1:02 PM 11/21/2020    1:00 PM 10/18/2020   10:46 AM 10/02/2020    9:17 AM 06/24/2020    8:40 AM  BP/Weight  Systolic BP 170 017 494  496 759 163  Diastolic BP 93 89 92  98 81 79  Wt. (Lbs) 190.12 193.12  202 207 204 185.12  BMI 31.16 kg/m2 33.15 kg/m2  34.67 kg/m2 35.53 kg/m2 35.02 kg/m2 31.78 kg/m2

## 2022-04-20 NOTE — Assessment & Plan Note (Signed)
Uncontrolled, higher dose amlodipine prescribed DASH diet and commitment to daily physical activity for a minimum of 30 minutes discussed and encouraged, as a part of hypertension management. The importance of attaining a healthy weight is also discussed.     04/16/2022    1:25 PM 10/07/2021    2:18 PM 11/21/2020    1:02 PM 11/21/2020    1:00 PM 10/18/2020   10:46 AM 10/02/2020    9:17 AM 06/24/2020    8:40 AM  BP/Weight  Systolic BP 505 183 358  251 898 421  Diastolic BP 93 89 92  98 81 79  Wt. (Lbs) 190.12 193.12  202 207 204 185.12  BMI 31.16 kg/m2 33.15 kg/m2  34.67 kg/m2 35.53 kg/m2 35.02 kg/m2 31.78 kg/m2

## 2022-04-21 ENCOUNTER — Other Ambulatory Visit: Payer: Self-pay | Admitting: Family Medicine

## 2022-04-21 LAB — CYTOLOGY - PAP
Comment: NEGATIVE
Diagnosis: NEGATIVE
High risk HPV: NEGATIVE

## 2022-04-21 MED ORDER — ROSUVASTATIN CALCIUM 10 MG PO TABS: 10.0000 mg | ORAL_TABLET | Freq: Every day | ORAL | 1 refills | Status: DC

## 2022-07-15 ENCOUNTER — Ambulatory Visit: Payer: BC Managed Care – PPO | Admitting: Family Medicine

## 2022-07-28 ENCOUNTER — Encounter: Payer: Self-pay | Admitting: Family Medicine

## 2022-07-28 ENCOUNTER — Ambulatory Visit (INDEPENDENT_AMBULATORY_CARE_PROVIDER_SITE_OTHER): Payer: BC Managed Care – PPO | Admitting: Family Medicine

## 2022-07-28 VITALS — BP 135/85 | HR 75 | Ht 66.0 in | Wt 190.0 lb

## 2022-07-28 DIAGNOSIS — I10 Essential (primary) hypertension: Secondary | ICD-10-CM | POA: Diagnosis not present

## 2022-07-28 DIAGNOSIS — D229 Melanocytic nevi, unspecified: Secondary | ICD-10-CM | POA: Diagnosis not present

## 2022-07-28 DIAGNOSIS — Z122 Encounter for screening for malignant neoplasm of respiratory organs: Secondary | ICD-10-CM | POA: Diagnosis not present

## 2022-07-28 DIAGNOSIS — E785 Hyperlipidemia, unspecified: Secondary | ICD-10-CM

## 2022-07-28 DIAGNOSIS — Z720 Tobacco use: Secondary | ICD-10-CM

## 2022-07-28 DIAGNOSIS — Z1231 Encounter for screening mammogram for malignant neoplasm of breast: Secondary | ICD-10-CM

## 2022-07-28 MED ORDER — AMLODIPINE BESYLATE 10 MG PO TABS: 10.0000 mg | ORAL_TABLET | Freq: Every day | ORAL | 2 refills | Status: DC

## 2022-07-28 NOTE — Assessment & Plan Note (Addendum)
Hyperlipidemia:Low fat diet discussed and encouraged., take prescribed med Updated lab needed at/ before next visit.    Lipid Panel  Lab Results  Component Value Date   CHOL 214 (H) 04/16/2022   HDL 42 04/16/2022   LDLCALC 152 (H) 04/16/2022   TRIG 109 04/16/2022   CHOLHDL 5.1 (H) 04/16/2022     Need to reduce faty and fried foods

## 2022-07-28 NOTE — Assessment & Plan Note (Signed)
Asked:confirms currently smokes cigarettes °Assess: Unwilling to set a quit date, but is cutting back °Advise: needs to QUIT to reduce risk of cancer, cardio and cerebrovascular disease °Assist: counseled for 5 minutes and literature provided °Arrange: follow up in 2 to 4 months ° °

## 2022-07-28 NOTE — Assessment & Plan Note (Signed)
Hyperpigmented fleshy mole, max dia approx 2 cm diameter, refer derm

## 2022-07-28 NOTE — Progress Notes (Signed)
Lydia Perez     MRN: [id number]      DOB: [birth year]   HPI Lydia Perez is here for follow up and re-evaluation of chronic medical conditions, medication management and review of any available recent lab and radiology data.  Preventive health is updated, specifically  Cancer screening and Immunization.   Questions or concerns regarding consultations or procedures which the PT has had in the interim are  addressed. The PT denies any adverse reactions to current medications since the last visit.  9 month h/o mole in feront of left ear x 9 months , growing rapidly, itchy at times  ROS Denies recent fever or chills. Denies sinus pressure, nasal congestion, ear pain or sore throat. Denies chest congestion, productive cough or wheezing. Denies chest pains, palpitations and leg swelling Denies abdominal pain, nausea, vomiting,diarrhea or constipation.   Denies dysuria, frequency, hesitancy or incontinence. Denies joint pain, swelling and limitation in mobility. Denies headaches, seizures, numbness, or tingling. Denies depression, anxiety or insomnia. Denies skin break down or rash.   PE  BP 135/85 (BP Location: Right Arm, Patient Position: Sitting, Cuff Size: Large)   Pulse 75   Ht '5\' 6"'$  (1.676 m)   Wt 190 lb 0.6 oz (86.2 kg)   SpO2 95%   BMI 30.67 kg/m   Patient alert and oriented and in no cardiopulmonary distress.  HEENT: No facial asymmetry, EOMI,     Neck supple .  Chest: Clear to auscultation bilaterally.  CVS: S1, S2 no murmurs, no S3.Regular rate.  ABD: Soft non tender.   Ext: No edema  MS: Adequate ROM spine, shoulders, hips and knees.  Skin: Intact, no ulcerations or rash noted.fleshy hyperpigmetned mole in front of ledt ear  Psych: Good eye contact, normal affect. Memory intact not anxious or depressed appearing.  CNS: CN 2-12 intact, power,  normal throughout.no focal deficits noted.   Assessment & Plan  Essential hypertension Controlled, no  change in medication DASH diet and commitment to daily physical activity for a minimum of 30 minutes discussed and encouraged, as a part of hypertension management. The importance of attaining a healthy weight is also discussed.     07/28/2022    1:47 PM 04/16/2022    1:25 PM 10/07/2021    2:18 PM 11/21/2020    1:02 PM 11/21/2020    1:00 PM 10/18/2020   10:46 AM 10/02/2020    9:17 AM  BP/Weight  Systolic BP 053 976 734 193  790 240  Diastolic BP 85 93 89 92  98 81  Wt. (Lbs) 190.04 190.12 193.12  202 207 204  BMI 30.67 kg/m2 31.16 kg/m2 33.15 kg/m2  34.67 kg/m2 35.53 kg/m2 35.02 kg/m2       Dyslipidemia Hyperlipidemia:Low fat diet discussed and encouraged., take prescribed med Updated lab needed at/ before next visit.    Lipid Panel  Lab Results  Component Value Date   CHOL 214 (H) 04/16/2022   HDL 42 04/16/2022   LDLCALC 152 (H) 04/16/2022   TRIG 109 04/16/2022   CHOLHDL 5.1 (H) 04/16/2022     Need to reduce faty and fried foods  Tobacco abuse Asked:confirms currently smokes cigarettes Assess: Unwilling to set a quit date, but is cutting back Advise: needs to QUIT to reduce risk of cancer, cardio and cerebrovascular disease Assist: counseled for 5 minutes and literature provided Arrange: follow up in 2 to 4 months   Fleshy skin mole Hyperpigmented fleshy mole, max dia approx 2 cm diameter, refer derm

## 2022-07-28 NOTE — Patient Instructions (Addendum)
F/U in 5 monhts, call if you need me sooner  Please reconsider flu vaccine  Excellent bP  Please fill cholesterol med  Please sched / refer for lung cancer screening   Please schedule mammogram at checkout, pt requests mobile unit  Need to work on quitting smoking , cut back by 1 cigarette every 1 to weeks, then you will have quit by year end  Fasting lipid, cmp and eGFR 3 days before next appt  It is important that you exercise regularly at least 30 minutes 5 times a week. If you develop chest pain, have severe difficulty breathing, or feel very tired, stop exercising immediately and seek medical attention   Think about what you will eat, plan ahead. Choose " clean, green, fresh or frozen" over canned, processed or packaged foods which are more sugary, salty and fatty. 70 to 75% of food eaten should be vegetables and fruit. Three meals at set times with snacks allowed between meals, but they must be fruit or vegetables. Aim to eat over a 12 hour period , example 7 am to 7 pm, and STOP after  your last meal of the day. Drink water,generally about 64 ounces per day, no other drink is as healthy. Fruit juice is best enjoyed in a healthy way, by EATING the fruit. Thanks for choosing [facility], we consider it a privelige to serve you.

## 2022-07-28 NOTE — Assessment & Plan Note (Signed)
Controlled, no change in medication DASH diet and commitment to daily physical activity for a minimum of 30 minutes discussed and encouraged, as a part of hypertension management. The importance of attaining a healthy weight is also discussed.     07/28/2022    1:47 PM 04/16/2022    1:25 PM 10/07/2021    2:18 PM 11/21/2020    1:02 PM 11/21/2020    1:00 PM 10/18/2020   10:46 AM 10/02/2020    9:17 AM  BP/Weight  Systolic BP 188 677 373 668  159 470  Diastolic BP 85 93 89 92  98 81  Wt. (Lbs) 190.04 190.12 193.12  202 207 204  BMI 30.67 kg/m2 31.16 kg/m2 33.15 kg/m2  34.67 kg/m2 35.53 kg/m2 35.02 kg/m2

## 2022-08-19 NOTE — Progress Notes (Signed)
Attempted to reach patient to schedule LDCT. Unable to reach the patient directly. Detailed VM left asking that the patient return my call.

## 2022-08-31 ENCOUNTER — Ambulatory Visit
Admission: RE | Admit: 2022-08-31 | Discharge: 2022-08-31 | Disposition: A | Discharge status: Home / Self Care | Payer: BC Managed Care – PPO | Source: Ambulatory Visit | Attending: Family Medicine | Admitting: Family Medicine

## 2022-08-31 DIAGNOSIS — Z1231 Encounter for screening mammogram for malignant neoplasm of breast: Secondary | ICD-10-CM | POA: Diagnosis not present

## 2022-11-19 ENCOUNTER — Other Ambulatory Visit: Payer: Self-pay

## 2022-11-19 NOTE — Progress Notes (Signed)
Attempted to reach patient regarding follow-up LDCT. Unable to reach the patient directly, detailed VM left asking that the patient return my call.  

## 2022-11-19 NOTE — Progress Notes (Signed)
Order placed for LDCT per protocol, Patient scheduled for 01/19 at [facility]

## 2022-12-04 ENCOUNTER — Ambulatory Visit (HOSPITAL_COMMUNITY)
Admission: RE | Admit: 2022-12-04 | Discharge: 2022-12-04 | Disposition: A | Discharge status: Home / Self Care | Payer: BC Managed Care – PPO | Source: Ambulatory Visit | Attending: Physician Assistant | Admitting: Physician Assistant

## 2022-12-04 DIAGNOSIS — Z87891 Personal history of nicotine dependence: Secondary | ICD-10-CM | POA: Diagnosis not present

## 2022-12-04 DIAGNOSIS — Z122 Encounter for screening for malignant neoplasm of respiratory organs: Secondary | ICD-10-CM | POA: Diagnosis not present

## 2022-12-04 DIAGNOSIS — F17210 Nicotine dependence, cigarettes, uncomplicated: Secondary | ICD-10-CM | POA: Diagnosis not present

## 2022-12-07 NOTE — Progress Notes (Signed)
Patient notified of LDCT Lung Cancer Screening Results via mail with the recommendation to follow-up in 12 months. Patient's referring provider has been sent a copy of results. Results are as follows:  IMPRESSION: 1. Lung-RADS 2, benign appearance or behavior. Continue annual screening with low-dose chest CT without contrast in 12 months. 2. Coronary artery calcification.

## 2022-12-28 DIAGNOSIS — E785 Hyperlipidemia, unspecified: Secondary | ICD-10-CM | POA: Diagnosis not present

## 2022-12-28 DIAGNOSIS — I10 Essential (primary) hypertension: Secondary | ICD-10-CM | POA: Diagnosis not present

## 2022-12-29 ENCOUNTER — Encounter: Payer: Self-pay | Admitting: Family Medicine

## 2022-12-29 ENCOUNTER — Ambulatory Visit: Payer: BC Managed Care – PPO | Admitting: Family Medicine

## 2022-12-29 VITALS — BP 140/90 | HR 77 | Ht 66.0 in | Wt 197.0 lb

## 2022-12-29 DIAGNOSIS — R7303 Prediabetes: Secondary | ICD-10-CM

## 2022-12-29 DIAGNOSIS — F411 Generalized anxiety disorder: Secondary | ICD-10-CM

## 2022-12-29 DIAGNOSIS — Z9189 Other specified personal risk factors, not elsewhere classified: Secondary | ICD-10-CM | POA: Diagnosis not present

## 2022-12-29 DIAGNOSIS — E669 Obesity, unspecified: Secondary | ICD-10-CM

## 2022-12-29 DIAGNOSIS — E785 Hyperlipidemia, unspecified: Secondary | ICD-10-CM

## 2022-12-29 DIAGNOSIS — I10 Essential (primary) hypertension: Secondary | ICD-10-CM

## 2022-12-29 DIAGNOSIS — Z720 Tobacco use: Secondary | ICD-10-CM

## 2022-12-29 LAB — LIPID PANEL
Chol/HDL Ratio: 4.5 ratio — ABNORMAL HIGH (ref 0.0–4.4)
Cholesterol, Total: 205 mg/dL — ABNORMAL HIGH (ref 100–199)
HDL: 46 mg/dL (ref >39)
LDL Chol Calc (NIH): 146 mg/dL — ABNORMAL HIGH (ref 0–99)
Triglycerides: 70 mg/dL (ref 0–149)
VLDL Cholesterol Cal: 13 mg/dL (ref 5–40)

## 2022-12-29 LAB — CMP14+EGFR
ALT: 8 IU/L (ref 0–32)
AST: 14 IU/L (ref 0–40)
Albumin/Globulin Ratio: 1.3 (ref 1.2–2.2)
Albumin: 4 g/dL (ref 3.9–4.9)
Alkaline Phosphatase: 71 IU/L (ref 44–121)
BUN/Creatinine Ratio: 14 (ref 12–28)
BUN: 16 mg/dL (ref 8–27)
Bilirubin Total: 0.3 mg/dL (ref 0.0–1.2)
CO2: 19 mmol/L — ABNORMAL LOW (ref 20–29)
Calcium: 9.3 mg/dL (ref 8.7–10.3)
Chloride: 107 mmol/L — ABNORMAL HIGH (ref 96–106)
Creatinine, Ser: 1.16 mg/dL — ABNORMAL HIGH (ref 0.57–1.00)
Globulin, Total: 3 g/dL (ref 1.5–4.5)
Glucose: 104 mg/dL — ABNORMAL HIGH (ref 70–99)
Potassium: 4.4 mmol/L (ref 3.5–5.2)
Sodium: 141 mmol/L (ref 134–144)
Total Protein: 7 g/dL (ref 6.0–8.5)
eGFR: 53 mL/min/{1.73_m2} — ABNORMAL LOW (ref >59)

## 2022-12-29 MED ORDER — ROSUVASTATIN CALCIUM 10 MG PO TABS: 10.0000 mg | ORAL_TABLET | Freq: Every day | ORAL | 5 refills | Status: DC

## 2022-12-29 MED ORDER — BUPROPION HCL ER (SR) 150 MG PO TB12: 150.0000 mg | ORAL_TABLET | Freq: Two times a day (BID) | ORAL | 3 refills | Status: DC

## 2022-12-29 MED ORDER — CHLORTHALIDONE 15 MG PO TABS: 15.0000 mg | ORAL_TABLET | Freq: Every day | ORAL | 3 refills | Status: DC

## 2022-12-29 MED ORDER — BUSPIRONE HCL 5 MG PO TABS: 5.0000 mg | ORAL_TABLET | Freq: Two times a day (BID) | ORAL | 3 refills | Status: DC

## 2022-12-29 MED ORDER — NICOTINE 7 MG/24HR TD PT24: 7.0000 mg | MEDICATED_PATCH | Freq: Every day | TRANSDERMAL | 1 refills | Status: DC

## 2022-12-29 MED ORDER — NICOTINE 14 MG/24HR TD PT24: 14.0000 mg | MEDICATED_PATCH | Freq: Every day | TRANSDERMAL | 0 refills | Status: DC

## 2022-12-29 NOTE — Patient Instructions (Addendum)
Follow-up in 6 to 8 weeks re-evaluate blood pressure and chronic problems. Non Fasting chem 7 and EGFR and HBA1C 3 to 5 days before next appointment  New medication to help with smoking cessation is Wellbutrin take 1 tablet twice daily.  Plan to start using the NicoDerm patch on March 1 and no smoking nce you place the patch   Once you start using the patch you do not smoke anymore.  Also  you are  starting additional medication for blood pressure which  is chlorthalidone 15 mg once daily, continue amlodipine 10 mg daily.  New to help with cholesterol other than lowering your fried and fatty food intake is rosuvastatin 1 daily.  New for anxiety is BuSpar 5 mg 1 tablet twice daily.  You are referred to cardiology for evaluation to ensure you have no heart disease as you are at increased risk based on your family history of mom having heart disease and bypass surgery in her 39s.  Also the fact that you smoke have high blood pressure and high cholesterol increases your risk.  We can work on lowering your risk by addressing those and also know we are getting a full evaluation to see exactly how your heart is functioning.  Exercise commitment to 20 to 30 minutes 5 days/week is recommended.  As far as food choice goes fruit  and vegetable should make up 65 to 70% of what you eat and the only healthy drink is water, you should aim to drink 64 ounces per day.  Thanks for choosing [facility], we consider it a privelige to serve you.

## 2022-12-31 ENCOUNTER — Encounter: Payer: Self-pay | Admitting: Family Medicine

## 2022-12-31 DIAGNOSIS — Z9189 Other specified personal risk factors, not elsewhere classified: Secondary | ICD-10-CM | POA: Insufficient documentation

## 2022-12-31 NOTE — Assessment & Plan Note (Signed)
  Patient re-educated about  the importance of commitment to a  minimum of 150 minutes of exercise per week as able.  The importance of healthy food choices with portion control discussed, as well as eating regularly and within a 12 hour window most days. The need to choose "clean , green" food 50 to 75% of the time is discussed, as well as to make water the primary drink and set a goal of 64 ounces water daily.       12/29/2022    1:36 PM 07/28/2022    1:47 PM 04/16/2022    1:25 PM  Weight /BMI  Weight 197 lb 0.6 oz 190 lb 0.6 oz 190 lb 1.9 oz  Height '5\' 6"'$  (1.676 m) '5\' 6"'$  (1.676 m) 5' 5.5" (1.664 m)  BMI 31.8 kg/m2 30.67 kg/m2 31.16 kg/m2

## 2022-12-31 NOTE — Assessment & Plan Note (Signed)
Uncontrolled , add chlorthalidone DASH diet and commitment to daily physical activity for a minimum of 30 minutes discussed and encouraged, as a part of hypertension management. The importance of attaining a healthy weight is also discussed.     12/29/2022    2:40 PM 12/29/2022    2:07 PM 12/29/2022    1:36 PM 07/28/2022    1:47 PM 04/16/2022    1:25 PM 10/07/2021    2:18 PM 11/21/2020    1:02 PM  BP/Weight  Systolic BP XX123456 0000000 A999333 A999333 Q000111Q [id number]  Diastolic BP 90 92 85 85 93 89 92  Wt. (Lbs)   197.04 190.04 190.12 193.12   BMI   31.8 kg/m2 30.67 kg/m2 31.16 kg/m2 33.15 kg/m2

## 2022-12-31 NOTE — Assessment & Plan Note (Signed)
Patient educated about the importance of limiting  Carbohydrate intake , the need to commit to daily physical activity for a minimum of 30 minutes , and to commit weight loss. The fact that changes in all these areas will reduce or eliminate all together the development of diabetes is stressed.      Latest Ref Rng & Units 12/28/2022    8:42 AM 04/16/2022    2:32 PM 10/02/2020   10:35 AM 06/24/2020   10:08 AM 09/27/2019    8:08 AM  Diabetic Labs  HbA1c 4.8 - 5.6 %  5.6  5.8   5.5   Chol 100 - 199 mg/dL 205  214   206    HDL >39 mg/dL 46  42   44    Calc LDL 0 - 99 mg/dL 146  152   147    Triglycerides 0 - 149 mg/dL 70  109   83    Creatinine 0.57 - 1.00 mg/dL 1.16  0.98   1.14  0.95       12/29/2022    2:40 PM 12/29/2022    2:07 PM 12/29/2022    1:36 PM 07/28/2022    1:47 PM 04/16/2022    1:25 PM 10/07/2021    2:18 PM 11/21/2020    1:02 PM  BP/Weight  Systolic BP XX123456 0000000 A999333 A999333 Q000111Q [id number]  Diastolic BP 90 92 85 85 93 89 92  Wt. (Lbs)   197.04 190.04 190.12 193.12   BMI   31.8 kg/m2 30.67 kg/m2 31.16 kg/m2 33.15 kg/m2        No data to display          Updated lab needed at/ before next visit.

## 2022-12-31 NOTE — Assessment & Plan Note (Signed)
Asked:confirms currently smokes cigarettes and wants to quit Assess: Unwilling to set a quit date, but is cutting back Advise: needs to QUIT to reduce risk of cancer, cardio and cerebrovascular disease Assist: counseled for 5 minutes and literature provided, wellbutrin started and also nicoderm patches prescribed Arrange: follow up in 2 to 4 months

## 2022-12-31 NOTE — Assessment & Plan Note (Signed)
Hyperlipidemia:Low fat diet discussed and encouraged.   Lipid Panel  Lab Results  Component Value Date   CHOL 205 (H) 12/28/2022   HDL 46 12/28/2022   LDLCALC 146 (H) 12/28/2022   TRIG 70 12/28/2022   CHOLHDL 4.5 (H) 12/28/2022    Start crestor

## 2022-12-31 NOTE — Progress Notes (Signed)
Lydia Perez     MRN: [id number]      DOB: 1956/03/11   HPI Lydia Perez is here for follow up and re-evaluation of chronic medical conditions, medication management and review of any available recent lab and radiology data.  Preventive health is updated, specifically  Cancer screening and Immunization.   Questions or concerns regarding consultations or procedures which the PT has had in the interim are  addressed. The PT denies any adverse reactions to current medications since the last visit.  C/o anxeity and stress as daughter is ill C/o increased nicotine  use and wants to quit smoking ROS Denies recent fever or chills. Denies sinus pressure, nasal congestion, ear pain or sore throat. Denies chest congestion, productive cough or wheezing. Denies chest pains, palpitations and leg swelling Denies abdominal pain, nausea, vomiting,diarrhea or constipation.   Denies dysuria, frequency, hesitancy or incontinence. Denies joint pain, swelling and limitation in mobility. Denies headaches, seizures, numbness, or tingling. . Denies skin break down or rash.   PE  BP (!) 140/90   Pulse 77   Ht 5' 6"$  (1.676 m)   Wt 197 lb 0.6 oz (89.4 kg)   SpO2 96%   BMI 31.80 kg/m   Patient alert and oriented and in no cardiopulmonary distress.  HEENT: No facial asymmetry, EOMI,     Neck supple .  Chest: Clear to auscultation bilaterally.  CVS: S1, S2 no murmurs, no S3.Regular rate.  ABD: Soft non tender.   Ext: No edema  MS: Adequate ROM spine, shoulders, hips and knees.  Skin: Intact, no ulcerations or rash noted.  Psych: Good eye contact, normal affect. Memory intact  anxious not depressed appearing.  CNS: CN 2-12 intact, power,  normal throughout.no focal deficits noted.   Assessment & Plan  Essential hypertension Uncontrolled , add chlorthalidone DASH diet and commitment to daily physical activity for a minimum of 30 minutes discussed and encouraged, as a part of  hypertension management. The importance of attaining a healthy weight is also discussed.     12/29/2022    2:40 PM 12/29/2022    2:07 PM 12/29/2022    1:36 PM 07/28/2022    1:47 PM 04/16/2022    1:25 PM 10/07/2021    2:18 PM 11/21/2020    1:02 PM  BP/Weight  Systolic BP XX123456 0000000 A999333 A999333 Q000111Q [id number]  Diastolic BP 90 92 85 85 93 89 92  Wt. (Lbs)   197.04 190.04 190.12 193.12   BMI   31.8 kg/m2 30.67 kg/m2 31.16 kg/m2 33.15 kg/m2        GAD (generalized anxiety disorder) Most of this is circumstantial as her daughter is ill and no dx after 2 years, start low dose Buspar  Tobacco abuse Asked:confirms currently smokes cigarettes and wants to quit Assess: Unwilling to set a quit date, but is cutting back Advise: needs to QUIT to reduce risk of cancer, cardio and cerebrovascular disease Assist: counseled for 5 minutes and literature provided, wellbutrin started and also nicoderm patches prescribed Arrange: follow up in 2 to 4 months   Prediabetes Patient educated about the importance of limiting  Carbohydrate intake , the need to commit to daily physical activity for a minimum of 30 minutes , and to commit weight loss. The fact that changes in all these areas will reduce or eliminate all together the development of diabetes is stressed.      Latest Ref Rng & Units 12/28/2022    8:42 AM 04/16/2022  2:32 PM 10/02/2020   10:35 AM 06/24/2020   10:08 AM 09/27/2019    8:08 AM  Diabetic Labs  HbA1c 4.8 - 5.6 %  5.6  5.8   5.5   Chol 100 - 199 mg/dL 205  214   206    HDL >39 mg/dL 46  42   44    Calc LDL 0 - 99 mg/dL 146  152   147    Triglycerides 0 - 149 mg/dL 70  109   83    Creatinine 0.57 - 1.00 mg/dL 1.16  0.98   1.14  0.95       12/29/2022    2:40 PM 12/29/2022    2:07 PM 12/29/2022    1:36 PM 07/28/2022    1:47 PM 04/16/2022    1:25 PM 10/07/2021    2:18 PM 11/21/2020    1:02 PM  BP/Weight  Systolic BP XX123456 0000000 A999333 A999333 Q000111Q [id number]  Diastolic BP 90 92 85 85 93 89 92  Wt. (Lbs)    197.04 190.04 190.12 193.12   BMI   31.8 kg/m2 30.67 kg/m2 31.16 kg/m2 33.15 kg/m2        No data to display          Updated lab needed at/ before next visit.   Obesity (BMI 30.0-34.9)  Patient re-educated about  the importance of commitment to a  minimum of 150 minutes of exercise per week as able.  The importance of healthy food choices with portion control discussed, as well as eating regularly and within a 12 hour window most days. The need to choose "clean , green" food 50 to 75% of the time is discussed, as well as to make water the primary drink and set a goal of 64 ounces water daily.       12/29/2022    1:36 PM 07/28/2022    1:47 PM 04/16/2022    1:25 PM  Weight /BMI  Weight 197 lb 0.6 oz 190 lb 0.6 oz 190 lb 1.9 oz  Height 5' 6"$  (1.676 m) 5' 6"$  (1.676 m) 5' 5.5" (1.664 m)  BMI 31.8 kg/m2 30.67 kg/m2 31.16 kg/m2      Dyslipidemia Hyperlipidemia:Low fat diet discussed and encouraged.   Lipid Panel  Lab Results  Component Value Date   CHOL 205 (H) 12/28/2022   HDL 46 12/28/2022   LDLCALC 146 (H) 12/28/2022   TRIG 70 12/28/2022   CHOLHDL 4.5 (H) 12/28/2022    Start crestor   At increased risk for cardiovascular disease Nicotine, HTN, hypoerlipidemia, IGT, f/h of premature cA D refer to Cardiology for evaluation

## 2022-12-31 NOTE — Assessment & Plan Note (Signed)
Nicotine, HTN, hypoerlipidemia, IGT, f/h of premature cA D refer to Cardiology for evaluation

## 2022-12-31 NOTE — Assessment & Plan Note (Signed)
Most of this is circumstantial as her daughter is ill and no dx after 2 years, start low dose Buspar

## 2023-01-21 ENCOUNTER — Encounter: Payer: Self-pay | Admitting: Nurse Practitioner

## 2023-01-21 ENCOUNTER — Ambulatory Visit: Payer: BC Managed Care – PPO | Attending: Nurse Practitioner | Admitting: Nurse Practitioner

## 2023-01-21 VITALS — BP 138/82 | HR 74 | Ht 66.0 in | Wt 200.6 lb

## 2023-01-21 DIAGNOSIS — E669 Obesity, unspecified: Secondary | ICD-10-CM

## 2023-01-21 DIAGNOSIS — Z87898 Personal history of other specified conditions: Secondary | ICD-10-CM | POA: Diagnosis not present

## 2023-01-21 DIAGNOSIS — I10 Essential (primary) hypertension: Secondary | ICD-10-CM

## 2023-01-21 DIAGNOSIS — Z8249 Family history of ischemic heart disease and other diseases of the circulatory system: Secondary | ICD-10-CM | POA: Diagnosis not present

## 2023-01-21 DIAGNOSIS — K449 Diaphragmatic hernia without obstruction or gangrene: Secondary | ICD-10-CM

## 2023-01-21 DIAGNOSIS — Z720 Tobacco use: Secondary | ICD-10-CM

## 2023-01-21 DIAGNOSIS — Z9189 Other specified personal risk factors, not elsewhere classified: Secondary | ICD-10-CM

## 2023-01-21 MED ORDER — PANTOPRAZOLE SODIUM 40 MG PO TBEC: 40.0000 mg | DELAYED_RELEASE_TABLET | Freq: Every day | ORAL | 1 refills | Status: DC

## 2023-01-21 NOTE — Progress Notes (Signed)
Office Visit    Patient Name: Lydia Perez Date of Encounter: 01/21/2023  PCP:  Fayrene Helper, [place]  Cardiologist:  Carlyle Dolly, MD  Advanced Practice Provider:  Finis Bud, NP Electrophysiologist:  None  [id number]  Chief Complaint    [place] SCREEN is a 67 y.o. female with a hx of chest pain, hypertension, family history of CAD, former smoker, who presents today for evaluation at the request of Dr. Tula Nakayama (PCP).   Past Medical History    Past Medical History:  Diagnosis Date   Anemia    NOS    Bipolar disorder ([place]) 2010   dx by PCP    COPD (chronic obstructive pulmonary disease) ([place])    Esophageal stricture    Hiatal hernia    History of cardiac cath 2002   Past Surgical History:  Procedure Laterality Date   COLONOSCOPY  08/13/2008   SLF: SIMPLE ADENOMA/HYPERPLASTIC POLYP   COLONOSCOPY N/A 05/15/2013   SLF:SIX COLORECTALPOLYPS REMOVED./RECTAL BLEEDING DUE TO Moderate sized internal hemorrhoids   ESOPHAGOGASTRODUODENOSCOPY  07/19/2007   SLF: Normal   ESOPHAGOGASTRODUODENOSCOPY (EGD) WITH ESOPHAGEAL DILATION N/A 05/15/2013   [id number] Non-erosive gastritis   HEMORRHOID BANDING N/A 05/15/2013   Procedure: HEMORRHOID BANDING;  Surgeon: Danie Binder, MD;  Location: [facility];  Service: Endoscopy;  Laterality: N/A;   TOTAL ABDOMINAL HYSTERECTOMY W/ BILATERAL SALPINGOOPHORECTOMY  01/2005   TUBAL LIGATION      Allergies  Allergies  Allergen Reactions   Codeine Other (See Comments)    Hallucinations    History of Present Illness    Lydia Perez is a very pleasant 67 y.o. female with a PMH as mentioned above.    Last seen by Dr. Carlyle Dolly on October 18, 2020.  Patient noted some chest tightness, located along mid chest/epigastrium, occurred with laying down, lasted a few minutes, better with leaning forward or standing up, symptoms were consistent with reflux/GERD.  EKG was  reassuring.  Was told to follow-up as needed.  Seen by PCP on 02/27/2023.  Did note increased nicotine use, wanted to quit smoking.  Has been referred by PCP, Dr. Moshe Cipro, due to increased risk for cardiovascular disease.  She presents today for evaluation.  She states she is doing well from a cardiac perspective.  Does admit to history of hiatal hernia, wants to know if chest sensation is due to hiatal hernia or her heart, points to lower mid chest/upper epigastric area. Denies any recent anginal chest pain, shortness of breath, palpitations, syncope, presyncope, dizziness, orthopnea, PND, swelling or significant weight changes, acute bleeding, or claudication.   SH: Denies any alcohol use or caffeine use.  Does smoke half pack per day, working on quitting.  FH: Does have positive family history of cardiovascular disease on her mom's side, brother passed away at 72 from a massive heart attack.  EKGs/Labs/Other Studies Reviewed:   The following studies were reviewed today:   EKG:  EKG is ordered today.  The ekg ordered today demonstrates normal sinus rhythm, 72 bpm, no acute ischemic changes.   Recent Labs: 04/16/2022: Hemoglobin 14.7; Platelets 267; TSH 2.280 12/28/2022: ALT 8; BUN 16; Creatinine, Ser 1.16; Potassium 4.4; Sodium 141  Recent Lipid Panel    Component Value Date/Time   CHOL 205 (H) 12/28/2022 0842   TRIG 70 12/28/2022 0842   HDL 46 12/28/2022 0842   CHOLHDL 4.5 (H) 12/28/2022 0842   CHOLHDL 3.3 02/15/2017 0932   VLDL  10 02/15/2017 0932   LDLCALC 146 (H) 12/28/2022 0842    Risk Assessment/Calculations:   The 10-year ASCVD risk score (Arnett DK, et al., 2019) is: 21%   Values used to calculate the score:     Age: 58 years     Sex: Female     Is Non-Hispanic African American: Yes     Diabetic: No     Tobacco smoker: Yes     Systolic Blood Pressure: 0000000 mmHg     Is BP treated: Yes     HDL Cholesterol: 46 mg/dL     Total Cholesterol: 205 mg/dL   Home Medications    Current Meds  Medication Sig   amLODipine (NORVASC) 10 MG tablet Take 1 tablet (10 mg total) by mouth daily.   pantoprazole (PROTONIX) 40 MG tablet Take 1 tablet (40 mg total) by mouth daily.     Review of Systems    All other systems reviewed and are otherwise negative except as noted above.  Physical Exam    VS:  BP 138/82   Pulse 74   Ht '5\' 6"'$  (1.676 m)   Wt 200 lb 9.6 oz (91 kg)   SpO2 95%   BMI 32.38 kg/m  , BMI Body mass index is 32.38 kg/m.  Wt Readings from Last 3 Encounters:  01/21/23 200 lb 9.6 oz (91 kg)  12/29/22 197 lb 0.6 oz (89.4 kg)  07/28/22 190 lb 0.6 oz (86.2 kg)     GEN: Obese, 67 year old female in no acute distress. HEENT: normal. Neck: Supple, no JVD, carotid bruits, or masses. Cardiac: S1/S2, RRR, no murmurs, rubs, or gallops. No clubbing, cyanosis, edema.  Radials/PT 2+ and equal bilaterally.  Respiratory:  Respirations regular and unlabored, clear to auscultation bilaterally. MS: No deformity or atrophy. Skin: Warm and dry, no rash. Neuro:  Strength and sensation are intact. Psych: Normal affect.  Assessment & Plan    History of chest pain, hx of hiatal hernia Does note occasional episodes of chest pain/GERD related pain.  Has not had ischemic evaluation performed, however does sound related to GERD.  Will initiate pantoprazole 40 mg daily.  If no relief in symptoms by next follow-up visit, plan to pursue coronary calcium score CT/CCTA or refer to GI. Heart healthy diet and regular cardiovascular exercise encouraged.   HTN Systolic blood pressure mildly elevated today.  Does admit to whitecoat hypertension.  BP well-controlled at home. Discussed to monitor BP at home at least 2 hours after medications and sitting for 5-10 minutes.  Continue amlodipine. Heart healthy diet and regular cardiovascular exercise encouraged.   Family history of CVD, obesity, at increased risk for cardiovascular disease Does note positive family history of CVD on  mom side.  Her ASCVD risk score today is 21%.  Does have multiple risk factors for CVD.  Discussed smoking cessation (see below) and heart healthy diet and regular cardiovascular exercise encouraged.  Will refer to prep program.  At next office visit, discuss coronary calcium score CT/CCTA.   Tobacco abuse Smoking cessation encouraged and discussed.    Disposition: Follow up in 2 - 3 month(s) with Carlyle Dolly, MD or APP.  Signed, Finis Bud, NP 01/21/2023, 12:07 PM [place]

## 2023-01-21 NOTE — Patient Instructions (Signed)
Medication Instructions:  Your physician has recommended you make the following change in your medication:  Start pantoprazole 40 mg once a day Continue all other medications as directed  Labwork: none  Testing/Procedures: none  Follow-Up:  Your physician recommends that you schedule a follow-up appointment in: 2-3 months  Any Other Special Instructions Will Be Listed Below (If Applicable).  You have been referred to [facility]  If you need a refill on your cardiac medications before your next appointment, please call your pharmacy.

## 2023-02-16 ENCOUNTER — Ambulatory Visit: Payer: BC Managed Care – PPO | Admitting: Family Medicine

## 2023-03-11 ENCOUNTER — Telehealth: Payer: Self-pay | Admitting: *Deleted

## 2023-03-11 NOTE — Telephone Encounter (Signed)
Returned my call regarding PREP Class. She declines participating at this time.

## 2023-03-11 NOTE — Telephone Encounter (Signed)
Contacted regarding PREP Class referral. Left voice message to return call for more information. 

## 2023-04-08 ENCOUNTER — Encounter: Payer: Self-pay | Admitting: *Deleted

## 2023-04-13 ENCOUNTER — Ambulatory Visit: Payer: BC Managed Care – PPO | Admitting: Nurse Practitioner

## 2023-07-02 ENCOUNTER — Encounter: Payer: Self-pay | Admitting: Family Medicine

## 2023-07-02 ENCOUNTER — Ambulatory Visit: Payer: BC Managed Care – PPO | Admitting: Family Medicine

## 2023-07-02 VITALS — BP 148/92 | HR 78 | Ht 64.0 in | Wt 200.0 lb

## 2023-07-02 DIAGNOSIS — I10 Essential (primary) hypertension: Secondary | ICD-10-CM | POA: Diagnosis not present

## 2023-07-02 DIAGNOSIS — K219 Gastro-esophageal reflux disease without esophagitis: Secondary | ICD-10-CM | POA: Diagnosis not present

## 2023-07-02 DIAGNOSIS — E785 Hyperlipidemia, unspecified: Secondary | ICD-10-CM | POA: Diagnosis not present

## 2023-07-02 DIAGNOSIS — Z720 Tobacco use: Secondary | ICD-10-CM

## 2023-07-02 DIAGNOSIS — R1319 Other dysphagia: Secondary | ICD-10-CM

## 2023-07-02 DIAGNOSIS — R5383 Other fatigue: Secondary | ICD-10-CM

## 2023-07-02 DIAGNOSIS — E559 Vitamin D deficiency, unspecified: Secondary | ICD-10-CM | POA: Diagnosis not present

## 2023-07-02 DIAGNOSIS — D126 Benign neoplasm of colon, unspecified: Secondary | ICD-10-CM

## 2023-07-02 DIAGNOSIS — Z1211 Encounter for screening for malignant neoplasm of colon: Secondary | ICD-10-CM | POA: Diagnosis not present

## 2023-07-02 DIAGNOSIS — R7303 Prediabetes: Secondary | ICD-10-CM

## 2023-07-02 MED ORDER — CHLORTHALIDONE 15 MG PO TABS: 15.0000 mg | ORAL_TABLET | Freq: Every day | ORAL | 3 refills | Status: DC

## 2023-07-02 MED ORDER — PANTOPRAZOLE SODIUM 40 MG PO TBEC: 40.0000 mg | DELAYED_RELEASE_TABLET | Freq: Every day | ORAL | 2 refills | Status: DC

## 2023-07-02 MED ORDER — PANTOPRAZOLE SODIUM 40 MG PO TBEC: 40.0000 mg | DELAYED_RELEASE_TABLET | Freq: Two times a day (BID) | ORAL | 0 refills | Status: DC

## 2023-07-02 NOTE — Patient Instructions (Addendum)
Annual exam in 6 to 10 weeks, call if you need me sooner  Blood pressure is high, 2 medications needed once daily, may take together, chlorthalidone and amlodipine    New additional medication for blood pressure is chlorthalidone one daily, ( was ordered in feb) , continue amlodipine as before  You are referred for colonoscopy and for uncontrolled reflx  PLEASE cut back with view to stopping caffeine and nicotine to relieve reflux Commit to protonix as prescribed  Please schedule mammogram in mobile truck if available per pt request  You are referred for f/u with Cardiology  need to keep appt   Please schedule dexa at checkout  Labs lipid, cmp and EGFR, CBC, TSH, HBA1C and vit D today

## 2023-07-03 LAB — TSH+FREE T4
Free T4: 1.15 ng/dL (ref 0.82–1.77)
TSH: 2.12 u[IU]/mL (ref 0.450–4.500)

## 2023-07-03 LAB — CMP14+EGFR
ALT: 10 IU/L (ref 0–32)
AST: 15 IU/L (ref 0–40)
Albumin: 4.3 g/dL (ref 3.9–4.9)
Alkaline Phosphatase: 83 IU/L (ref 44–121)
BUN/Creatinine Ratio: 11 — ABNORMAL LOW (ref 12–28)
BUN: 11 mg/dL (ref 8–27)
Bilirubin Total: 0.4 mg/dL (ref 0.0–1.2)
CO2: 23 mmol/L (ref 20–29)
Calcium: 9.7 mg/dL (ref 8.7–10.3)
Chloride: 105 mmol/L (ref 96–106)
Creatinine, Ser: 1.04 mg/dL — ABNORMAL HIGH (ref 0.57–1.00)
Globulin, Total: 3.2 g/dL (ref 1.5–4.5)
Glucose: 99 mg/dL (ref 70–99)
Potassium: 4.1 mmol/L (ref 3.5–5.2)
Sodium: 143 mmol/L (ref 134–144)
Total Protein: 7.5 g/dL (ref 6.0–8.5)
eGFR: 60 mL/min/{1.73_m2} (ref >59)

## 2023-07-03 LAB — LIPID PANEL
Chol/HDL Ratio: 4.7 ratio — ABNORMAL HIGH (ref 0.0–4.4)
Cholesterol, Total: 211 mg/dL — ABNORMAL HIGH (ref 100–199)
HDL: 45 mg/dL (ref >39)
LDL Chol Calc (NIH): 150 mg/dL — ABNORMAL HIGH (ref 0–99)
Triglycerides: 90 mg/dL (ref 0–149)
VLDL Cholesterol Cal: 16 mg/dL (ref 5–40)

## 2023-07-03 LAB — CBC
Hematocrit: 45.3 % (ref 34.0–46.6)
Hemoglobin: 14.9 g/dL (ref 11.1–15.9)
MCH: 31.2 pg (ref 26.6–33.0)
MCHC: 32.9 g/dL (ref 31.5–35.7)
MCV: 95 fL (ref 79–97)
Platelets: 240 10*3/uL (ref 150–450)
RBC: 4.77 x10E6/uL (ref 3.77–5.28)
RDW: 12.6 % (ref 11.7–15.4)
WBC: 5.9 10*3/uL (ref 3.4–10.8)

## 2023-07-03 LAB — HEMOGLOBIN A1C
Est. average glucose Bld gHb Est-mCnc: 126 mg/dL
Hgb A1c MFr Bld: 6 % — ABNORMAL HIGH (ref 4.8–5.6)

## 2023-07-03 LAB — VITAMIN D 25 HYDROXY (VIT D DEFICIENCY, FRACTURES): Vit D, 25-Hydroxy: 21.1 ng/mL — ABNORMAL LOW (ref 30.0–100.0)

## 2023-07-03 MED ORDER — ROSUVASTATIN CALCIUM 10 MG PO TABS: 10.0000 mg | ORAL_TABLET | Freq: Every day | ORAL | 5 refills | Status: DC

## 2023-07-03 MED ORDER — VITAMIN D (ERGOCALCIFEROL) 1.25 MG (50000 UNIT) PO CAPS: 50000.0000 [IU] | ORAL_CAPSULE | ORAL | 8 refills | Status: DC

## 2023-07-07 ENCOUNTER — Ambulatory Visit: Payer: BC Managed Care – PPO | Admitting: Internal Medicine

## 2023-07-08 ENCOUNTER — Encounter: Payer: Self-pay | Admitting: Family Medicine

## 2023-07-08 NOTE — Assessment & Plan Note (Signed)
Asked:confirms currently smokes cigarettes Assess: Unwilling to set a quit date, but is trying to cut back Advise: needs to QUIT to reduce risk of cancer, cardio and cerebrovascular disease Assist: counseled for 5 minutes and literature provided Arrange: follow up in 2 to 4 months  

## 2023-07-08 NOTE — Progress Notes (Signed)
   Lydia Perez     MRN: [id number]      DOB: Apr 02, 1956  Chief Complaint  Patient presents with   Hypertension    HPI Lydia Perez is here for follow up and re-evaluation of chronic medical conditions, medication management and review of any available recent lab and radiology data.  Preventive health is updated, specifically  Cancer screening and Immunization.  These are both overdue , has been under increased stress this year due to poor health of her daughter, does not want medication or therapy at this time Smoking more,  drinking aot of sodas, reflux uncontrolled    ROS Denies recent fever or chills. Denies sinus pressure, nasal congestion, ear pain or sore throat. Denies chest congestion, productive cough or wheezing. Denies chest pains, palpitations and leg swelling   Denies dysuria, frequency, hesitancy or incontinence. Denies joint pain, swelling and limitation in mobility. Denies headaches, seizures, numbness, or tingling.  Denies skin break down or rash.   PE  BP (!) 148/92   Pulse 78   Ht 5\' 4"  (1.626 m)   Wt 200 lb (90.7 kg)   SpO2 94%   BMI 34.33 kg/m   Patient alert and oriented and in no cardiopulmonary distress.  HEENT: No facial asymmetry, EOMI,     Neck supple .  Chest: Clear to auscultation bilaterally.decreased air entry  CVS: S1, S2 no murmurs, no S3.Regular rate.  ABD: Soft non tender.   Ext: No edema  MS: Adequate ROM spine, shoulders, hips and knees.  Skin: Intact, no ulcerations or rash noted.  Psych: Good eye contact, normal affect. Memory intact  anxious and mildly  depressed appearing.  CNS: CN 2-12 intact, power,  normal throughout.no focal deficits noted.   Assessment & Plan  Essential hypertension Uncontrolled , additional med to be sTARTED DASH diet and commitment to daily physical activity for a minimum of 30 minutes discussed and encouraged, as a part of hypertension management. The importance of attaining a healthy  weight is also discussed.     07/02/2023    2:44 PM 07/02/2023    2:39 PM 07/02/2023    1:43 PM 01/21/2023   11:20 AM 12/29/2022    2:40 PM 12/29/2022    2:07 PM 12/29/2022    1:36 PM  BP/Weight  Systolic BP 148 148 130 138 140 138 131  Diastolic BP 92 92 83 82 90 92 85  Wt. (Lbs)   200 200.6   197.04  BMI   34.33 kg/m2 32.38 kg/m2   31.8 kg/m2     CLOSE F/U  Dyslipidemia Hyperlipidemia:Low fat diet discussed and encouraged. nEEDS TO START EDICATION AS PRESCRIBED   Lipid Panel  Lab Results  Component Value Date   CHOL 211 (H) 07/02/2023   HDL 45 07/02/2023   LDLCALC 150 (H) 07/02/2023   TRIG 90 07/02/2023   CHOLHDL 4.7 (H) 07/02/2023       Tobacco abuse Asked:confirms currently smokes cigarettes Assess: Unwilling to set a quit date, but is trying to cut back Advise: needs to QUIT to reduce risk of cancer, cardio and cerebrovascular disease Assist: counseled for 5 minutes and literature provided Arrange: follow up in 2 to 4 months   GERD Uncontrolled, resume medication and reduce/ stop irritants, also reports some dysphagia, GI eval  Vitamin D deficiency Start weekly vit D  Adenomatous colon polyp Rept colonoscopy past due , she is referred

## 2023-07-08 NOTE — Assessment & Plan Note (Signed)
Start weekly vit D 

## 2023-07-08 NOTE — Assessment & Plan Note (Addendum)
Hyperlipidemia:Low fat diet discussed and encouraged. nEEDS TO START EDICATION AS PRESCRIBED   Lipid Panel  Lab Results  Component Value Date   CHOL 211 (H) 07/02/2023   HDL 45 07/02/2023   LDLCALC 150 (H) 07/02/2023   TRIG 90 07/02/2023   CHOLHDL 4.7 (H) 07/02/2023

## 2023-07-08 NOTE — Assessment & Plan Note (Signed)
Rept colonoscopy past due , she is referred

## 2023-07-08 NOTE — Assessment & Plan Note (Addendum)
Uncontrolled, resume medication and reduce/ stop irritants, also reports some dysphagia, GI eval

## 2023-07-08 NOTE — Assessment & Plan Note (Signed)
Uncontrolled , additional med to be sTARTED DASH diet and commitment to daily physical activity for a minimum of 30 minutes discussed and encouraged, as a part of hypertension management. The importance of attaining a healthy weight is also discussed.     07/02/2023    2:44 PM 07/02/2023    2:39 PM 07/02/2023    1:43 PM 01/21/2023   11:20 AM 12/29/2022    2:40 PM 12/29/2022    2:07 PM 12/29/2022    1:36 PM  BP/Weight  Systolic BP 148 148 130 138 140 138 131  Diastolic BP 92 92 83 82 90 92 85  Wt. (Lbs)   200 200.6   197.04  BMI   34.33 kg/m2 32.38 kg/m2   31.8 kg/m2     CLOSE F/U

## 2023-09-15 ENCOUNTER — Ambulatory Visit (HOSPITAL_COMMUNITY)
Admission: RE | Admit: 2023-09-15 | Discharge: 2023-09-15 | Disposition: A | Discharge status: Home / Self Care | Payer: BC Managed Care – PPO | Source: Ambulatory Visit | Attending: Family Medicine | Admitting: Family Medicine

## 2023-09-15 ENCOUNTER — Encounter: Payer: Self-pay | Admitting: Family Medicine

## 2023-09-15 ENCOUNTER — Ambulatory Visit (INDEPENDENT_AMBULATORY_CARE_PROVIDER_SITE_OTHER): Payer: BC Managed Care – PPO | Admitting: Family Medicine

## 2023-09-15 ENCOUNTER — Encounter (HOSPITAL_COMMUNITY): Payer: Self-pay

## 2023-09-15 VITALS — BP 139/85 | HR 77 | Ht 64.0 in | Wt 203.1 lb

## 2023-09-15 DIAGNOSIS — Z122 Encounter for screening for malignant neoplasm of respiratory organs: Secondary | ICD-10-CM

## 2023-09-15 DIAGNOSIS — R7303 Prediabetes: Secondary | ICD-10-CM | POA: Diagnosis not present

## 2023-09-15 DIAGNOSIS — Z1231 Encounter for screening mammogram for malignant neoplasm of breast: Secondary | ICD-10-CM | POA: Diagnosis not present

## 2023-09-15 DIAGNOSIS — Z6379 Other stressful life events affecting family and household: Secondary | ICD-10-CM

## 2023-09-15 DIAGNOSIS — Z720 Tobacco use: Secondary | ICD-10-CM

## 2023-09-15 DIAGNOSIS — E785 Hyperlipidemia, unspecified: Secondary | ICD-10-CM | POA: Diagnosis not present

## 2023-09-15 DIAGNOSIS — I10 Essential (primary) hypertension: Secondary | ICD-10-CM

## 2023-09-15 DIAGNOSIS — E66811 Obesity, class 1: Secondary | ICD-10-CM

## 2023-09-15 MED ORDER — NICOTINE 21 MG/24HR TD PT24: 21.0000 mg | MEDICATED_PATCH | Freq: Every day | TRANSDERMAL | 0 refills | Status: DC

## 2023-09-15 MED ORDER — BUPROPION HCL ER (SR) 150 MG PO TB12: 150.0000 mg | ORAL_TABLET | Freq: Two times a day (BID) | ORAL | 3 refills | Status: DC

## 2023-09-15 MED ORDER — AMLODIPINE BESYLATE 10 MG PO TABS: 10.0000 mg | ORAL_TABLET | Freq: Every day | ORAL | 2 refills | Status: AC

## 2023-09-15 MED ORDER — CHLORTHALIDONE 15 MG PO TABS: 15.0000 mg | ORAL_TABLET | Freq: Every day | ORAL | 3 refills | Status: DC

## 2023-09-15 NOTE — Patient Instructions (Addendum)
F/U in 9 weeks, call if you need me sooner  Blood pressure is still too high, you NEED TWO different meds for your blood pressure. These are Amlodipine 10 mg one daily AND Chlorthalidone 15 mg one daily,  both are at your pharmacy  Good that you plan to quit smoking and want to, Wellbutrin ( Zyban ) is prescribed for this and set a quit date for 2 to 3 weeks after you start the wellbutrin You may also use nicodrm patch AFTER you stop smoking to help yopu with quitting Call 1800 QUITNOW for help also  Please fill out form for colonoscopy and mail in so you get this, it is past due (Nurse please give letter to patient)  You are referred for lung  cancer screen  Fasting lipid, cmp and eGFR and HBA1C 3 to 5 days before next appt  Kepp an open mind to therapy  Thanks for choosing [facility], we consider it a privelige to serve you.

## 2023-09-26 ENCOUNTER — Encounter: Payer: Self-pay | Admitting: Family Medicine

## 2023-09-26 DIAGNOSIS — Z6379 Other stressful life events affecting family and household: Secondary | ICD-10-CM | POA: Insufficient documentation

## 2023-09-26 NOTE — Assessment & Plan Note (Signed)
Hyperlipidemia:Low fat diet discussed and encouraged.   Lipid Panel  Lab Results  Component Value Date   CHOL 211 (H) 07/02/2023   HDL 45 07/02/2023   LDLCALC 150 (H) 07/02/2023   TRIG 90 07/02/2023   CHOLHDL 4.7 (H) 07/02/2023     Updated lab needed at/ before next visit.

## 2023-09-26 NOTE — Assessment & Plan Note (Signed)
Uncontrolled, non compliant, re educated and meds re prescribed DASH diet and commitment to daily physical activity for a minimum of 30 minutes discussed and encouraged, as a part of hypertension management. The importance of attaining a healthy weight is also discussed.     09/15/2023    9:52 AM 07/02/2023    2:44 PM 07/02/2023    2:39 PM 07/02/2023    1:43 PM 01/21/2023   11:20 AM 12/29/2022    2:40 PM 12/29/2022    2:07 PM  BP/Weight  Systolic BP 139 148 148 130 138 140 138  Diastolic BP 85 92 92 83 82 90 92  Wt. (Lbs) 203.12   200 200.6    BMI 34.87 kg/m2   34.33 kg/m2 32.38 kg/m2

## 2023-09-26 NOTE — Assessment & Plan Note (Signed)
  Patient re-educated about  the importance of commitment to a  minimum of 150 minutes of exercise per week as able.  The importance of healthy food choices with portion control discussed, as well as eating regularly and within a 12 hour window most days. The need to choose "clean , green" food 50 to 75% of the time is discussed, as well as to make water the primary drink and set a goal of 64 ounces water daily.       09/15/2023    9:52 AM 07/02/2023    1:43 PM 01/21/2023   11:20 AM  Weight /BMI  Weight 203 lb 1.9 oz 200 lb 200 lb 9.6 oz  Height 5\' 4"  (1.626 m) 5\' 4"  (1.626 m) 5\' 6"  (1.676 m)  BMI 34.87 kg/m2 34.33 kg/m2 32.38 kg/m2    Unchanged

## 2023-09-26 NOTE — Assessment & Plan Note (Signed)
Encouraged her to consider therapy states no interest currently

## 2023-09-26 NOTE — Assessment & Plan Note (Signed)
Patient educated about the importance of limiting  Carbohydrate intake , the need to commit to daily physical activity for a minimum of 30 minutes , and to commit weight loss. The fact that changes in all these areas will reduce or eliminate all together the development of diabetes is stressed.      Latest Ref Rng & Units 07/02/2023    2:54 PM 12/28/2022    8:42 AM 04/16/2022    2:32 PM 10/02/2020   10:35 AM 06/24/2020   10:08 AM  Diabetic Labs  HbA1c 4.8 - 5.6 % 6.0   5.6  5.8    Chol 100 - 199 mg/dL 962  952  841   324   HDL >39 mg/dL 45  46  42   44   Calc LDL 0 - 99 mg/dL 401  027  253   664   Triglycerides 0 - 149 mg/dL 90  70  403   83   Creatinine 0.57 - 1.00 mg/dL 4.74  2.59  5.63   8.75       09/15/2023    9:52 AM 07/02/2023    2:44 PM 07/02/2023    2:39 PM 07/02/2023    1:43 PM 01/21/2023   11:20 AM 12/29/2022    2:40 PM 12/29/2022    2:07 PM  BP/Weight  Systolic BP 139 148 148 130 138 140 138  Diastolic BP 85 92 92 83 82 90 92  Wt. (Lbs) 203.12   200 200.6    BMI 34.87 kg/m2   34.33 kg/m2 32.38 kg/m2         No data to display          Updated lab needed at/ before next visit.

## 2023-09-26 NOTE — Assessment & Plan Note (Addendum)
Asked:confirms currently smokes cigarettes Assess: Unwilling to set a quit date, but is cutting back. Wellbutrin prescribed also nicoderm patches Advise: needs to QUIT to reduce risk of cancer, cardio and cerebrovascular disease Assist: counseled for 5 minutes and literature provided Arrange: follow up in 2 to 4 months

## 2023-09-26 NOTE — Progress Notes (Signed)
Lydia Perez     MRN: [id number]      DOB: 19-Feb-1956  Chief Complaint  Patient presents with   Follow-up    Follow up    HPI Lydia Perez is here for follow up and re-evaluation of chronic medical conditions, medication management and review of any available recent lab and radiology data.  Preventive health is updated, specifically  Cancer screening and Immunization.   Questions or concerns regarding consultations or procedures which the PT has had in the interim are  addressed. The PT denies any adverse reactions to current medications since the last visit.  There are no new concerns.  There are no specific complaints   ROS Denies recent fever or chills. Denies sinus pressure, nasal congestion, ear pain or sore throat. Denies chest congestion, productive cough or wheezing. Denies chest pains, palpitations and leg swelling Denies abdominal pain, nausea, vomiting,diarrhea or constipation.   Denies dysuria, frequency, hesitancy or incontinence. Denies joint pain, swelling and limitation in mobility. Denies headaches, seizures, numbness, or tingling. Denies skin break down or rash.   PE  BP 139/85 (BP Location: Right Arm, Patient Position: Sitting, Cuff Size: Large)   Pulse 77   Ht 5\' 4"  (1.626 m)   Wt 203 lb 1.9 oz (92.1 kg)   SpO2 97%   BMI 34.87 kg/m   Patient alert and oriented and in no cardiopulmonary distress.  HEENT: No facial asymmetry, EOMI,     Neck supple .  Chest: Clear to auscultation bilaterally.  CVS: S1, S2 no murmurs, no S3.Regular rate.  ABD: Soft non tender.   Ext: No edema  MS: Adequate ROM spine, shoulders, hips and knees.  Skin: Intact, no ulcerations or rash noted.  Psych: Good eye contact, normal affect. Memory intact not anxious or depressed appearing.  CNS: CN 2-12 intact, power,  normal throughout.no focal deficits noted.   Assessment & Plan  Essential hypertension Uncontrolled, non compliant, re educated and meds re  prescribed DASH diet and commitment to daily physical activity for a minimum of 30 minutes discussed and encouraged, as a part of hypertension management. The importance of attaining a healthy weight is also discussed.     09/15/2023    9:52 AM 07/02/2023    2:44 PM 07/02/2023    2:39 PM 07/02/2023    1:43 PM 01/21/2023   11:20 AM 12/29/2022    2:40 PM 12/29/2022    2:07 PM  BP/Weight  Systolic BP 139 148 148 130 138 140 138  Diastolic BP 85 92 92 83 82 90 92  Wt. (Lbs) 203.12   200 200.6    BMI 34.87 kg/m2   34.33 kg/m2 32.38 kg/m2         Dyslipidemia Hyperlipidemia:Low fat diet discussed and encouraged.   Lipid Panel  Lab Results  Component Value Date   CHOL 211 (H) 07/02/2023   HDL 45 07/02/2023   LDLCALC 150 (H) 07/02/2023   TRIG 90 07/02/2023   CHOLHDL 4.7 (H) 07/02/2023     Updated lab needed at/ before next visit.   Prediabetes Patient educated about the importance of limiting  Carbohydrate intake , the need to commit to daily physical activity for a minimum of 30 minutes , and to commit weight loss. The fact that changes in all these areas will reduce or eliminate all together the development of diabetes is stressed.      Latest Ref Rng & Units 07/02/2023    2:54 PM 12/28/2022    8:42 AM  04/16/2022    2:32 PM 10/02/2020   10:35 AM 06/24/2020   10:08 AM  Diabetic Labs  HbA1c 4.8 - 5.6 % 6.0   5.6  5.8    Chol 100 - 199 mg/dL 161  096  045   409   HDL >39 mg/dL 45  46  42   44   Calc LDL 0 - 99 mg/dL 811  914  782   956   Triglycerides 0 - 149 mg/dL 90  70  213   83   Creatinine 0.57 - 1.00 mg/dL 0.86  5.78  4.69   6.29       09/15/2023    9:52 AM 07/02/2023    2:44 PM 07/02/2023    2:39 PM 07/02/2023    1:43 PM 01/21/2023   11:20 AM 12/29/2022    2:40 PM 12/29/2022    2:07 PM  BP/Weight  Systolic BP 139 148 148 130 138 140 138  Diastolic BP 85 92 92 83 82 90 92  Wt. (Lbs) 203.12   200 200.6    BMI 34.87 kg/m2   34.33 kg/m2 32.38 kg/m2         No data to  display          Updated lab needed at/ before next visit.   Tobacco abuse Asked:confirms currently smokes cigarettes Assess: Unwilling to set a quit date, but is cutting back. Wellbutrin prescribed also nicoderm patches Advise: needs to QUIT to reduce risk of cancer, cardio and cerebrovascular disease Assist: counseled for 5 minutes and literature provided Arrange: follow up in 2 to 4 months   Obesity (BMI 30.0-34.9)  Patient re-educated about  the importance of commitment to a  minimum of 150 minutes of exercise per week as able.  The importance of healthy food choices with portion control discussed, as well as eating regularly and within a 12 hour window most days. The need to choose "clean , green" food 50 to 75% of the time is discussed, as well as to make water the primary drink and set a goal of 64 ounces water daily.       09/15/2023    9:52 AM 07/02/2023    1:43 PM 01/21/2023   11:20 AM  Weight /BMI  Weight 203 lb 1.9 oz 200 lb 200 lb 9.6 oz  Height 5\' 4"  (1.626 m) 5\' 4"  (1.626 m) 5\' 6"  (1.676 m)  BMI 34.87 kg/m2 34.33 kg/m2 32.38 kg/m2    Unchanged  Stress due to illness of family member Encouraged her to consider therapy states no interest currently

## 2023-10-06 ENCOUNTER — Encounter: Payer: Self-pay | Admitting: Emergency Medicine

## 2023-10-07 ENCOUNTER — Other Ambulatory Visit: Payer: Self-pay

## 2023-10-07 DIAGNOSIS — F17210 Nicotine dependence, cigarettes, uncomplicated: Secondary | ICD-10-CM

## 2023-10-07 DIAGNOSIS — Z87891 Personal history of nicotine dependence: Secondary | ICD-10-CM

## 2023-10-07 DIAGNOSIS — Z122 Encounter for screening for malignant neoplasm of respiratory organs: Secondary | ICD-10-CM

## 2023-11-18 ENCOUNTER — Ambulatory Visit: Payer: BC Managed Care – PPO | Admitting: Family Medicine

## 2023-12-06 ENCOUNTER — Ambulatory Visit (HOSPITAL_COMMUNITY)
Admission: RE | Admit: 2023-12-06 | Discharge: 2023-12-06 | Disposition: A | Discharge status: Home / Self Care | Payer: BC Managed Care – PPO | Source: Ambulatory Visit | Attending: Family Medicine | Admitting: Family Medicine

## 2023-12-06 DIAGNOSIS — J479 Bronchiectasis, uncomplicated: Secondary | ICD-10-CM | POA: Diagnosis not present

## 2023-12-06 DIAGNOSIS — Z87891 Personal history of nicotine dependence: Secondary | ICD-10-CM | POA: Diagnosis not present

## 2023-12-06 DIAGNOSIS — Z122 Encounter for screening for malignant neoplasm of respiratory organs: Secondary | ICD-10-CM | POA: Diagnosis not present

## 2023-12-06 DIAGNOSIS — N289 Disorder of kidney and ureter, unspecified: Secondary | ICD-10-CM | POA: Diagnosis not present

## 2023-12-06 DIAGNOSIS — F17210 Nicotine dependence, cigarettes, uncomplicated: Secondary | ICD-10-CM | POA: Insufficient documentation

## 2023-12-06 DIAGNOSIS — I700 Atherosclerosis of aorta: Secondary | ICD-10-CM | POA: Diagnosis not present

## 2023-12-13 ENCOUNTER — Other Ambulatory Visit: Payer: Self-pay | Admitting: Acute Care

## 2023-12-13 DIAGNOSIS — F17210 Nicotine dependence, cigarettes, uncomplicated: Secondary | ICD-10-CM

## 2023-12-13 DIAGNOSIS — Z122 Encounter for screening for malignant neoplasm of respiratory organs: Secondary | ICD-10-CM

## 2023-12-13 DIAGNOSIS — Z87891 Personal history of nicotine dependence: Secondary | ICD-10-CM

## 2023-12-13 NOTE — Progress Notes (Signed)
Patient notified of LDCT Lung Cancer Screening Results via mail with the recommendation to follow-up in 12 months. Patient's referring provider has been sent a copy of results. Results are as follows:   IMPRESSION: 1. Lung-RADS 2, benign appearance or behavior. Continue annual screening with low-dose chest CT without contrast in 12 months. 2. Cylindrical bronchiectasis. 3.  Aortic atherosclerosis (ICD10-I70.0). 4.  Emphysema (ICD10-J43.9).

## 2023-12-31 ENCOUNTER — Ambulatory Visit: Payer: BC Managed Care – PPO | Admitting: Family Medicine

## 2023-12-31 ENCOUNTER — Telehealth: Payer: BC Managed Care – PPO | Admitting: Family Medicine

## 2023-12-31 ENCOUNTER — Encounter: Payer: Self-pay | Admitting: Family Medicine

## 2023-12-31 DIAGNOSIS — R7303 Prediabetes: Secondary | ICD-10-CM

## 2023-12-31 DIAGNOSIS — I10 Essential (primary) hypertension: Secondary | ICD-10-CM | POA: Diagnosis not present

## 2023-12-31 DIAGNOSIS — K219 Gastro-esophageal reflux disease without esophagitis: Secondary | ICD-10-CM | POA: Diagnosis not present

## 2023-12-31 DIAGNOSIS — E785 Hyperlipidemia, unspecified: Secondary | ICD-10-CM | POA: Diagnosis not present

## 2023-12-31 DIAGNOSIS — Z6379 Other stressful life events affecting family and household: Secondary | ICD-10-CM

## 2023-12-31 DIAGNOSIS — R053 Chronic cough: Secondary | ICD-10-CM

## 2023-12-31 DIAGNOSIS — Z720 Tobacco use: Secondary | ICD-10-CM

## 2023-12-31 DIAGNOSIS — E66811 Obesity, class 1: Secondary | ICD-10-CM

## 2023-12-31 MED ORDER — BENZONATATE 100 MG PO CAPS: 100.0000 mg | ORAL_CAPSULE | Freq: Four times a day (QID) | ORAL | 1 refills | Status: DC | PRN

## 2023-12-31 NOTE — Patient Instructions (Addendum)
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

## 2023-12-31 NOTE — Progress Notes (Signed)
Virtual Visit via Video Note  I connected with Lydia Perez on 12/31/23 at  4:20 PM EST by a video enabled telemedicine application and verified that I am speaking with the correct person using two identifiers.  Location: Patient: home Provider: office   I discussed the limitations of evaluation and management by telemedicine and the availability of in person appointments. The patient expressed understanding and agreed to proceed.  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 Good communication with no confusion and intact memory. Alert and oriented x 3 No signs of respiratory distress during speech  Assessment and Plan: Essential hypertension DASH diet and commitment to daily physical activity for a minimum of 30 minutes discussed and encouraged, as a part of hypertension management. The importance of attaining a healthy weight is also discussed.     09/15/2023    9:52 AM 07/02/2023    2:44 PM 07/02/2023    2:39 PM 07/02/2023    1:43 PM 01/21/2023   11:20 AM 12/29/2022    2:40 PM 12/29/2022    2:07 PM  BP/Weight  Systolic BP 139 148 148 130 138 140 138  Diastolic BP 85 92 92 83 82 90 92  Wt. (Lbs) 203.12   200 200.6    BMI 34.87 kg/m2   34.33 kg/m2 32.38 kg/m2       Nurse visit to establish control  Dyslipidemia Hyperlipidemia:Low fat diet discussed and encouraged.   Lipid Panel  Lab Results  Component Value Date   CHOL 211 (H) 07/02/2023   HDL 45 07/02/2023   LDLCALC 150 (H) 07/02/2023   TRIG 90 07/02/2023   CHOLHDL 4.7 (H)  07/02/2023     Updated lab needed at/ before next visit.   Prediabetes Patient educated about the importance of limiting  Carbohydrate intake , the need to commit to daily physical activity for a minimum of 30 minutes , and to commit weight loss. The fact that changes in all these areas will reduce or eliminate all together the development of diabetes is stressed.      Latest Ref Rng & Units 07/02/2023    2:54 PM 12/28/2022    8:42 AM 04/16/2022    2:32 PM 10/02/2020   10:35 AM 06/24/2020   10:08 AM  Diabetic Labs  HbA1c 4.8 - 5.6 % 6.0   5.6  5.8    Chol 100 - 199 mg/dL 409  811  914   782   HDL >39 mg/dL 45  46  42   44   Calc LDL 0 - 99 mg/dL 956  213  086   578   Triglycerides 0 - 149 mg/dL 90  70  469   83   Creatinine 0.57 - 1.00 mg/dL 6.29  5.28  4.13   2.44       09/15/2023    9:52 AM 07/02/2023    2:44 PM 07/02/2023    2:39 PM 07/02/2023    1:43 PM 01/21/2023   11:20 AM 12/29/2022    2:40 PM 12/29/2022  2:07 PM  BP/Weight  Systolic BP 139 148 148 130 138 140 138  Diastolic BP 85 92 92 83 82 90 92  Wt. (Lbs) 203.12   200 200.6    BMI 34.87 kg/m2   34.33 kg/m2 32.38 kg/m2         No data to display          Updated lab needed at/ before next visit.   GERD Controlled, no change in medication   Stress due to illness of family member Reduced currently as dughter's health has improved  Obesity (BMI 30.0-34.9)  Patient re-educated about  the importance of commitment to a  minimum of 150 minutes of exercise per week as able.  The importance of healthy food choices with portion control discussed, as well as eating regularly and within a 12 hour window most days. The need to choose "clean , green" food 50 to 75% of the time is discussed, as well as to make water the primary drink and set a goal of 64 ounces water daily.       09/15/2023    9:52 AM 07/02/2023    1:43 PM 01/21/2023   11:20 AM  Weight /BMI  Weight 203 lb 1.9 oz 200 lb 200 lb 9.6 oz  Height 5\' 4"   (1.626 m) 5\' 4"  (1.626 m) 5\' 6"  (1.676 m)  BMI 34.87 kg/m2 34.33 kg/m2 32.38 kg/m2     Weight to be updated  Cough Tessalon perles prescribed   Follow Up Instructions:    I discussed the assessment and treatment plan with the patient. The patient was provided an opportunity to ask questions and all were answered. The patient agreed with the plan and demonstrated an understanding of the instructions.   The patient was advised to call back or seek an in-person evaluation if the symptoms worsen or if the condition fails to improve as anticipated.  I provided 22 minutes of non-face-to-face time during this encounter.   Syliva Overman, MD

## 2024-01-01 DIAGNOSIS — R059 Cough, unspecified: Secondary | ICD-10-CM | POA: Insufficient documentation

## 2024-01-01 NOTE — Assessment & Plan Note (Signed)
 Patient educated about the importance of limiting  Carbohydrate intake , the need to commit to daily physical activity for a minimum of 30 minutes , and to commit weight loss. The fact that changes in all these areas will reduce or eliminate all together the development of diabetes is stressed.      Latest Ref Rng & Units 07/02/2023    2:54 PM 12/28/2022    8:42 AM 04/16/2022    2:32 PM 10/02/2020   10:35 AM 06/24/2020   10:08 AM  Diabetic Labs  HbA1c 4.8 - 5.6 % 6.0   5.6  5.8    Chol 100 - 199 mg/dL 962  952  841   324   HDL >39 mg/dL 45  46  42   44   Calc LDL 0 - 99 mg/dL 401  027  253   664   Triglycerides 0 - 149 mg/dL 90  70  403   83   Creatinine 0.57 - 1.00 mg/dL 4.74  2.59  5.63   8.75       09/15/2023    9:52 AM 07/02/2023    2:44 PM 07/02/2023    2:39 PM 07/02/2023    1:43 PM 01/21/2023   11:20 AM 12/29/2022    2:40 PM 12/29/2022    2:07 PM  BP/Weight  Systolic BP 139 148 148 130 138 140 138  Diastolic BP 85 92 92 83 82 90 92  Wt. (Lbs) 203.12   200 200.6    BMI 34.87 kg/m2   34.33 kg/m2 32.38 kg/m2         No data to display          Updated lab needed at/ before next visit.

## 2024-01-01 NOTE — Assessment & Plan Note (Signed)
DASH diet and commitment to daily physical activity for a minimum of 30 minutes discussed and encouraged, as a part of hypertension management. The importance of attaining a healthy weight is also discussed.     09/15/2023    9:52 AM 07/02/2023    2:44 PM 07/02/2023    2:39 PM 07/02/2023    1:43 PM 01/21/2023   11:20 AM 12/29/2022    2:40 PM 12/29/2022    2:07 PM  BP/Weight  Systolic BP 139 148 148 130 138 140 138  Diastolic BP 85 92 92 83 82 90 92  Wt. (Lbs) 203.12   200 200.6    BMI 34.87 kg/m2   34.33 kg/m2 32.38 kg/m2       Nurse visit to establish control

## 2024-01-01 NOTE — Assessment & Plan Note (Signed)
  Patient re-educated about  the importance of commitment to a  minimum of 150 minutes of exercise per week as able.  The importance of healthy food choices with portion control discussed, as well as eating regularly and within a 12 hour window most days. The need to choose "clean , green" food 50 to 75% of the time is discussed, as well as to make water the primary drink and set a goal of 64 ounces water daily.       09/15/2023    9:52 AM 07/02/2023    1:43 PM 01/21/2023   11:20 AM  Weight /BMI  Weight 203 lb 1.9 oz 200 lb 200 lb 9.6 oz  Height 5\' 4"  (1.626 m) 5\' 4"  (1.626 m) 5\' 6"  (1.676 m)  BMI 34.87 kg/m2 34.33 kg/m2 32.38 kg/m2     Weight to be updated

## 2024-01-01 NOTE — Assessment & Plan Note (Signed)
Tessalon perles prescribed.

## 2024-01-01 NOTE — Assessment & Plan Note (Signed)
Reduced currently as dughter's health has improved

## 2024-01-01 NOTE — Assessment & Plan Note (Signed)
 Controlled, no change in medication

## 2024-01-01 NOTE — Assessment & Plan Note (Signed)
 Hyperlipidemia:Low fat diet discussed and encouraged.   Lipid Panel  Lab Results  Component Value Date   CHOL 211 (H) 07/02/2023   HDL 45 07/02/2023   LDLCALC 150 (H) 07/02/2023   TRIG 90 07/02/2023   CHOLHDL 4.7 (H) 07/02/2023     Updated lab needed at/ before next visit.

## 2024-01-04 ENCOUNTER — Ambulatory Visit: Payer: Medicare Other

## 2024-01-04 DIAGNOSIS — E785 Hyperlipidemia, unspecified: Secondary | ICD-10-CM | POA: Diagnosis not present

## 2024-01-04 DIAGNOSIS — R7303 Prediabetes: Secondary | ICD-10-CM | POA: Diagnosis not present

## 2024-01-04 DIAGNOSIS — I10 Essential (primary) hypertension: Secondary | ICD-10-CM | POA: Diagnosis not present

## 2024-01-04 NOTE — Progress Notes (Signed)
 Pt. Was instructed by provider to return to office for wt  B/P check .    Today's readings have been logged.   Pt.  did not complain of any pain or concerns.   She did state she was also here to complete lab work from previous visit.   MA informed pt. That as soon as we had results from her lab work that we would call and if the provider had any further evaluation request we would inform her at that time.   Please review vitals and instruct/ advise on if the pt. Needs to continue to visit office for B/P check to keep vital records until she is able to obtain a pressure machine for herself.   Thank you.

## 2024-01-05 ENCOUNTER — Encounter: Payer: Self-pay | Admitting: Family Medicine

## 2024-01-05 LAB — CMP14+EGFR
ALT: 8 [IU]/L (ref 0–32)
AST: 14 [IU]/L (ref 0–40)
Albumin: 4 g/dL (ref 3.9–4.9)
Alkaline Phosphatase: 81 [IU]/L (ref 44–121)
BUN/Creatinine Ratio: 12 (ref 12–28)
BUN: 13 mg/dL (ref 8–27)
Bilirubin Total: 0.4 mg/dL (ref 0.0–1.2)
CO2: 23 mmol/L (ref 20–29)
Calcium: 9 mg/dL (ref 8.7–10.3)
Chloride: 105 mmol/L (ref 96–106)
Creatinine, Ser: 1.13 mg/dL — ABNORMAL HIGH (ref 0.57–1.00)
Globulin, Total: 3.2 g/dL (ref 1.5–4.5)
Glucose: 105 mg/dL — ABNORMAL HIGH (ref 70–99)
Potassium: 4.4 mmol/L (ref 3.5–5.2)
Sodium: 141 mmol/L (ref 134–144)
Total Protein: 7.2 g/dL (ref 6.0–8.5)
eGFR: 54 mL/min/{1.73_m2} — ABNORMAL LOW (ref >59)

## 2024-01-05 LAB — LIPID PANEL
Chol/HDL Ratio: 4.9 {ratio} — ABNORMAL HIGH (ref 0.0–4.4)
Cholesterol, Total: 196 mg/dL (ref 100–199)
HDL: 40 mg/dL (ref >39)
LDL Chol Calc (NIH): 140 mg/dL — ABNORMAL HIGH (ref 0–99)
Triglycerides: 88 mg/dL (ref 0–149)
VLDL Cholesterol Cal: 16 mg/dL (ref 5–40)

## 2024-01-05 LAB — HEMOGLOBIN A1C
Est. average glucose Bld gHb Est-mCnc: 123 mg/dL
Hgb A1c MFr Bld: 5.9 % — ABNORMAL HIGH (ref 4.8–5.6)

## 2024-01-05 NOTE — Progress Notes (Signed)
Provider cosigned and responded that no further B/P checks are needed.

## 2024-03-30 ENCOUNTER — Encounter: Payer: Medicare Other | Admitting: Family Medicine

## 2024-05-05 ENCOUNTER — Other Ambulatory Visit (HOSPITAL_COMMUNITY): Payer: Self-pay | Admitting: Family Medicine

## 2024-05-05 ENCOUNTER — Ambulatory Visit (INDEPENDENT_AMBULATORY_CARE_PROVIDER_SITE_OTHER): Admitting: Family Medicine

## 2024-05-05 ENCOUNTER — Encounter: Payer: Self-pay | Admitting: Family Medicine

## 2024-05-05 VITALS — BP 135/82 | HR 56 | Resp 16 | Ht 65.5 in | Wt 208.1 lb

## 2024-05-05 DIAGNOSIS — D126 Benign neoplasm of colon, unspecified: Secondary | ICD-10-CM | POA: Diagnosis not present

## 2024-05-05 DIAGNOSIS — I10 Essential (primary) hypertension: Secondary | ICD-10-CM | POA: Diagnosis not present

## 2024-05-05 DIAGNOSIS — Z720 Tobacco use: Secondary | ICD-10-CM

## 2024-05-05 DIAGNOSIS — E66811 Obesity, class 1: Secondary | ICD-10-CM

## 2024-05-05 DIAGNOSIS — E559 Vitamin D deficiency, unspecified: Secondary | ICD-10-CM | POA: Diagnosis not present

## 2024-05-05 DIAGNOSIS — K219 Gastro-esophageal reflux disease without esophagitis: Secondary | ICD-10-CM

## 2024-05-05 DIAGNOSIS — Z780 Asymptomatic menopausal state: Secondary | ICD-10-CM

## 2024-05-05 DIAGNOSIS — E785 Hyperlipidemia, unspecified: Secondary | ICD-10-CM | POA: Diagnosis not present

## 2024-05-05 DIAGNOSIS — Z6379 Other stressful life events affecting family and household: Secondary | ICD-10-CM

## 2024-05-05 DIAGNOSIS — R7303 Prediabetes: Secondary | ICD-10-CM | POA: Diagnosis not present

## 2024-05-05 DIAGNOSIS — Z1231 Encounter for screening mammogram for malignant neoplasm of breast: Secondary | ICD-10-CM

## 2024-05-05 MED ORDER — ROSUVASTATIN CALCIUM 10 MG PO TABS: 10.0000 mg | ORAL_TABLET | Freq: Every day | ORAL | 5 refills | Status: DC

## 2024-05-05 MED ORDER — PANTOPRAZOLE SODIUM 40 MG PO TBEC: 40.0000 mg | DELAYED_RELEASE_TABLET | Freq: Every day | ORAL | 2 refills | Status: DC

## 2024-05-05 NOTE — Assessment & Plan Note (Signed)
 Hyperlipidemia:Low fat diet discussed and encouraged.   Lipid Panel  Lab Results  Component Value Date   CHOL 196 01/04/2024   HDL 40 01/04/2024   LDLCALC 140 (H) 01/04/2024   TRIG 88 01/04/2024   CHOLHDL 4.9 (H) 01/04/2024     Updated lab needed

## 2024-05-05 NOTE — Patient Instructions (Addendum)
 F/U in  4 months,call if you need me sooner  Please schedule AWV at check out   Continue to stay nicotine  free,and use brocoli for stress relief  Change to water  from sodas please  Pls schedule mammogram and dexa at checkout  Labs today, cBC, lipid, cmp and eGFR, tSH and vit D ( Nurse pls order)  Please get screening colonoscopy as this is due  HBA1c , chem 7 and eGGFR 3 to 5 days before next visit  Thanks for choosing [facility], we consider it a privelige to serve you.

## 2024-05-05 NOTE — Assessment & Plan Note (Signed)
 States smoked 2 weeks ago stress is the trigger, encourragwed alternate management of stress

## 2024-05-05 NOTE — Assessment & Plan Note (Signed)
 Needs colonscopy and agrees on referral

## 2024-05-05 NOTE — Assessment & Plan Note (Signed)
 Less as health is improved

## 2024-05-05 NOTE — Assessment & Plan Note (Signed)
  Patient re-educated about  the importance of commitment to a  minimum of 150 minutes of exercise per week as able.  The importance of healthy food choices with portion control discussed, as well as eating regularly and within a 12 hour window most days. The need to choose clean , green food 50 to 75% of the time is discussed, as well as to make water  the primary drink and set a goal of 64 ounces water  daily.       05/05/2024   10:58 AM 01/04/2024    9:27 AM 09/15/2023    9:52 AM  Weight /BMI  Weight 208 lb 1.9 oz 210 lb 0.6 oz 203 lb 1.9 oz  Height 5' 5.5 (1.664 m) 5' 4 (1.626 m) 5' 4 (1.626 m)  BMI 34.11 kg/m2 36.05 kg/m2 34.87 kg/m2

## 2024-05-05 NOTE — Progress Notes (Signed)
   Lydia Perez     MRN: [id number]      DOB: April 16, 1956  Chief Complaint  Patient presents with   Hypertension    HPI [name] is here for follow up and re-evaluation of chronic medical conditions, medication management and review of any available recent lab and radiology data.  Preventive health is updated, specifically  Cancer screening and Immunization.   Has decided on colonoscopy , though she is afraid C/o heartburn esp when lying down, and dry cough at night, has had esophageal dialtion in the past, not currently/ recently on reflyux medication  ROS Denies recent fever or chills. Denies sinus pressure, nasal congestion, ear pain or sore throat. Denies chest congestion, productive cough or wheezing. Denies PND, orthopnea, palpitations and leg swelling C/o epigastric  abdominal pain, nausea, denies vomiting,diarrhea or constipation.   Denies dysuria, frequency, hesitancy or incontinence. Denies joint pain, swelling and limitation in mobility. Denies headaches, seizures, numbness, or tingling. Denies depression, c/o intermittent  anxiety or insomnia. Denies skin break down or rash.   PE  BP 135/82   Pulse (!) 56   Resp 16   Ht 5' 5.5 (1.664 m)   Wt 208 lb 1.9 oz (94.4 kg)   SpO2 96%   BMI 34.11 kg/m   Patient alert and oriented and in no cardiopulmonary distress.  HEENT: No facial asymmetry, EOMI,     Neck supple .  Chest: Clear to auscultation bilaterally.  CVS: S1, S2 no murmurs, no S3.Regular rate.  Ext: No edema  MS: Adequate ROM spine, shoulders, hips and knees.  Skin: Intact, no ulcerations or rash noted.  Psych: Good eye contact, normal affect. Memory intact not anxious or depressed appearing.  CNS: CN 2-12 intact, power,  normal throughout.no focal deficits noted.   Assessment & Plan  Adenomatous colon polyp Needs colonscopy and agrees on referral  Tobacco abuse States smoked 2 weeks ago stress is the trigger, encourragwed alternate  management of stress  Stress due to illness of family member Less as health is improved  GERD Uncontrolled , needs to stop caffeine and nicotine  and resume PPI  Obesity (BMI 30.0-34.9)  Patient re-educated about  the importance of commitment to a  minimum of 150 minutes of exercise per week as able.  The importance of healthy food choices with portion control discussed, as well as eating regularly and within a 12 hour window most days. The need to choose clean , green food 50 to 75% of the time is discussed, as well as to make water  the primary drink and set a goal of 64 ounces water  daily.       05/05/2024   10:58 AM 01/04/2024    9:27 AM 09/15/2023    9:52 AM  Weight /BMI  Weight 208 lb 1.9 oz 210 lb 0.6 oz 203 lb 1.9 oz  Height 5' 5.5 (1.664 m) 5' 4 (1.626 m) 5' 4 (1.626 m)  BMI 34.11 kg/m2 36.05 kg/m2 34.87 kg/m2      Dyslipidemia Hyperlipidemia:Low fat diet discussed and encouraged.   Lipid Panel  Lab Results  Component Value Date   CHOL 196 01/04/2024   HDL 40 01/04/2024   LDLCALC 140 (H) 01/04/2024   TRIG 88 01/04/2024   CHOLHDL 4.9 (H) 01/04/2024     Updated lab needed

## 2024-05-05 NOTE — Assessment & Plan Note (Signed)
 Uncontrolled , needs to stop caffeine and nicotine  and resume PPI

## 2024-05-06 LAB — CMP14+EGFR
ALT: 10 IU/L (ref 0–32)
AST: 15 IU/L (ref 0–40)
Albumin: 4.2 g/dL (ref 3.9–4.9)
Alkaline Phosphatase: 81 IU/L (ref 44–121)
BUN/Creatinine Ratio: 12 (ref 12–28)
BUN: 14 mg/dL (ref 8–27)
Bilirubin Total: 0.4 mg/dL (ref 0.0–1.2)
CO2: 22 mmol/L (ref 20–29)
Calcium: 9.4 mg/dL (ref 8.7–10.3)
Chloride: 104 mmol/L (ref 96–106)
Creatinine, Ser: 1.17 mg/dL — ABNORMAL HIGH (ref 0.57–1.00)
Globulin, Total: 3.2 g/dL (ref 1.5–4.5)
Glucose: 100 mg/dL — ABNORMAL HIGH (ref 70–99)
Potassium: 4.3 mmol/L (ref 3.5–5.2)
Sodium: 140 mmol/L (ref 134–144)
Total Protein: 7.4 g/dL (ref 6.0–8.5)
eGFR: 52 mL/min/{1.73_m2} — ABNORMAL LOW (ref >59)

## 2024-05-06 LAB — CBC WITH DIFFERENTIAL/PLATELET
Basophils Absolute: 0 10*3/uL (ref 0.0–0.2)
Basos: 0 %
EOS (ABSOLUTE): 0.3 10*3/uL (ref 0.0–0.4)
Eos: 5 %
Hematocrit: 47.9 % — ABNORMAL HIGH (ref 34.0–46.6)
Hemoglobin: 15.2 g/dL (ref 11.1–15.9)
Immature Grans (Abs): 0 10*3/uL (ref 0.0–0.1)
Immature Granulocytes: 0 %
Lymphocytes Absolute: 1.9 10*3/uL (ref 0.7–3.1)
Lymphs: 36 %
MCH: 31 pg (ref 26.6–33.0)
MCHC: 31.7 g/dL (ref 31.5–35.7)
MCV: 98 fL — ABNORMAL HIGH (ref 79–97)
Monocytes Absolute: 0.5 10*3/uL (ref 0.1–0.9)
Monocytes: 9 %
Neutrophils Absolute: 2.8 10*3/uL (ref 1.4–7.0)
Neutrophils: 50 %
Platelets: 234 10*3/uL (ref 150–450)
RBC: 4.9 x10E6/uL (ref 3.77–5.28)
RDW: 12.5 % (ref 11.7–15.4)
WBC: 5.5 10*3/uL (ref 3.4–10.8)

## 2024-05-06 LAB — LIPID PANEL
Chol/HDL Ratio: 4.3 ratio (ref 0.0–4.4)
Cholesterol, Total: 188 mg/dL (ref 100–199)
HDL: 44 mg/dL (ref >39)
LDL Chol Calc (NIH): 127 mg/dL — ABNORMAL HIGH (ref 0–99)
Triglycerides: 90 mg/dL (ref 0–149)
VLDL Cholesterol Cal: 17 mg/dL (ref 5–40)

## 2024-05-06 LAB — VITAMIN D 25 HYDROXY (VIT D DEFICIENCY, FRACTURES): Vit D, 25-Hydroxy: 21.8 ng/mL — ABNORMAL LOW (ref 30.0–100.0)

## 2024-05-06 LAB — TSH: TSH: 2.79 u[IU]/mL (ref 0.450–4.500)

## 2024-05-07 ENCOUNTER — Ambulatory Visit: Payer: Self-pay | Admitting: Family Medicine

## 2024-05-07 MED ORDER — VITAMIN D (ERGOCALCIFEROL) 1.25 MG (50000 UNIT) PO CAPS: 50000.0000 [IU] | ORAL_CAPSULE | ORAL | 8 refills | Status: AC

## 2024-05-11 ENCOUNTER — Encounter (INDEPENDENT_AMBULATORY_CARE_PROVIDER_SITE_OTHER): Payer: Self-pay | Admitting: *Deleted

## 2024-08-09 ENCOUNTER — Telehealth: Payer: Self-pay | Admitting: Family Medicine

## 2024-08-09 NOTE — Telephone Encounter (Signed)
 Spoke with patient and let her know Dr Antonetta is out of the office and she is on the wait list if anything comes available. Patient understood

## 2024-08-09 NOTE — Telephone Encounter (Signed)
 Copied from CRM #[id number]. Topic: Appointments - Appointment Info/Confirmation >> Aug 09, 2024 12:18 PM [name] wrote: The patient called in because she had an upcoming appointment that was cancelled and she doesn't know why. I rescheduled her appointment until January 28th and put her on the wait list but she had no idea the schedule was out that far or why. She was supposed to have a physical and blood work done the week before but it was just scheduled as a 4 month follow up which will now be twice that long if she is not seen until January. Please contact the patient as she would like to speak with someone about what is going on.

## 2024-09-11 ENCOUNTER — Other Ambulatory Visit (HOSPITAL_COMMUNITY)

## 2024-09-11 ENCOUNTER — Ambulatory Visit (HOSPITAL_COMMUNITY)

## 2024-09-12 ENCOUNTER — Ambulatory Visit (INDEPENDENT_AMBULATORY_CARE_PROVIDER_SITE_OTHER): Payer: Self-pay

## 2024-09-12 DIAGNOSIS — Z111 Encounter for screening for respiratory tuberculosis: Secondary | ICD-10-CM | POA: Diagnosis not present

## 2024-09-12 NOTE — Progress Notes (Signed)
 Patient is in office today for a nurse visit for PPD. Patient Injection was given in the  Right arm. Patient tolerated injection well.

## 2024-09-15 ENCOUNTER — Ambulatory Visit: Payer: Self-pay

## 2024-09-15 ENCOUNTER — Other Ambulatory Visit: Payer: Self-pay | Admitting: Family Medicine

## 2024-09-15 ENCOUNTER — Ambulatory Visit: Admitting: Family Medicine

## 2024-09-15 NOTE — Progress Notes (Signed)
 Patient is in office today for a nurse visit for PPD. Patients PPD was Negative

## 2024-09-18 ENCOUNTER — Encounter (HOSPITAL_COMMUNITY): Payer: Self-pay

## 2024-09-18 ENCOUNTER — Ambulatory Visit (HOSPITAL_COMMUNITY)
Admission: RE | Admit: 2024-09-18 | Discharge: 2024-09-18 | Disposition: A | Discharge status: Home / Self Care | Source: Ambulatory Visit | Attending: Family Medicine | Admitting: Family Medicine

## 2024-09-18 DIAGNOSIS — Z1231 Encounter for screening mammogram for malignant neoplasm of breast: Secondary | ICD-10-CM | POA: Insufficient documentation

## 2024-09-18 DIAGNOSIS — Z780 Asymptomatic menopausal state: Secondary | ICD-10-CM | POA: Insufficient documentation

## 2024-11-13 ENCOUNTER — Encounter (INDEPENDENT_AMBULATORY_CARE_PROVIDER_SITE_OTHER): Payer: Self-pay | Admitting: *Deleted

## 2024-11-28 ENCOUNTER — Other Ambulatory Visit: Payer: Self-pay | Admitting: Family Medicine

## 2024-11-28 DIAGNOSIS — R7303 Prediabetes: Secondary | ICD-10-CM

## 2024-11-28 DIAGNOSIS — I10 Essential (primary) hypertension: Secondary | ICD-10-CM

## 2024-11-28 DIAGNOSIS — R918 Other nonspecific abnormal finding of lung field: Secondary | ICD-10-CM

## 2024-11-28 DIAGNOSIS — E559 Vitamin D deficiency, unspecified: Secondary | ICD-10-CM

## 2024-11-28 DIAGNOSIS — E785 Hyperlipidemia, unspecified: Secondary | ICD-10-CM

## 2024-12-06 ENCOUNTER — Ambulatory Visit (HOSPITAL_COMMUNITY)

## 2024-12-08 ENCOUNTER — Ambulatory Visit (HOSPITAL_COMMUNITY)
Admission: RE | Admit: 2024-12-08 | Discharge: 2024-12-08 | Disposition: A | Discharge status: Home / Self Care | Source: Ambulatory Visit | Attending: Acute Care | Admitting: Acute Care

## 2024-12-08 DIAGNOSIS — Z87891 Personal history of nicotine dependence: Secondary | ICD-10-CM | POA: Insufficient documentation

## 2024-12-08 DIAGNOSIS — F17210 Nicotine dependence, cigarettes, uncomplicated: Secondary | ICD-10-CM | POA: Diagnosis present

## 2024-12-08 DIAGNOSIS — Z122 Encounter for screening for malignant neoplasm of respiratory organs: Secondary | ICD-10-CM | POA: Diagnosis present

## 2024-12-13 ENCOUNTER — Encounter: Payer: Self-pay | Admitting: Family Medicine

## 2024-12-13 ENCOUNTER — Ambulatory Visit: Payer: Self-pay | Admitting: Family Medicine

## 2024-12-13 VITALS — BP 140/86 | HR 71 | Resp 18 | Ht 65.5 in | Wt 205.0 lb

## 2024-12-13 DIAGNOSIS — Z1211 Encounter for screening for malignant neoplasm of colon: Secondary | ICD-10-CM

## 2024-12-13 DIAGNOSIS — Z720 Tobacco use: Secondary | ICD-10-CM

## 2024-12-13 DIAGNOSIS — R1319 Other dysphagia: Secondary | ICD-10-CM | POA: Diagnosis not present

## 2024-12-13 DIAGNOSIS — K219 Gastro-esophageal reflux disease without esophagitis: Secondary | ICD-10-CM | POA: Diagnosis not present

## 2024-12-13 DIAGNOSIS — I10 Essential (primary) hypertension: Secondary | ICD-10-CM

## 2024-12-13 DIAGNOSIS — Z6379 Other stressful life events affecting family and household: Secondary | ICD-10-CM | POA: Diagnosis not present

## 2024-12-13 DIAGNOSIS — Z9189 Other specified personal risk factors, not elsewhere classified: Secondary | ICD-10-CM | POA: Diagnosis not present

## 2024-12-13 DIAGNOSIS — E785 Hyperlipidemia, unspecified: Secondary | ICD-10-CM

## 2024-12-13 DIAGNOSIS — R1310 Dysphagia, unspecified: Secondary | ICD-10-CM | POA: Insufficient documentation

## 2024-12-13 MED ORDER — BUPROPION HCL ER (SR) 150 MG PO TB12: ORAL_TABLET | ORAL | 2 refills | Status: AC

## 2024-12-13 MED ORDER — BUSPIRONE HCL 5 MG PO TABS: ORAL_TABLET | ORAL | 2 refills | Status: AC

## 2024-12-13 MED ORDER — CHLORTHALIDONE 15 MG PO TABS: 15.0000 mg | ORAL_TABLET | Freq: Every day | ORAL | 3 refills | Status: AC

## 2024-12-13 NOTE — Patient Instructions (Addendum)
 F/U in 8 to 10 weeks  New medication to help stop smoking also call 1800 QUITNOW  New for anxiety is buspar   Blood pressure is high, need top take teo meds, amlodipine  which you currently  take and also chlorthalidone  which is prescribed  You are referred to GI  as discussed  Labs already ordered today  You will be referred for heart screening test  Condolence re recent loss  Thanks for choosing [facility], we consider it a privelige to serve you.

## 2024-12-13 NOTE — Progress Notes (Unsigned)
"  Wellbutrin .   "

## 2024-12-13 NOTE — Assessment & Plan Note (Signed)
"  Refer endo  "

## 2024-12-14 ENCOUNTER — Ambulatory Visit: Payer: Self-pay | Admitting: Family Medicine

## 2024-12-14 ENCOUNTER — Other Ambulatory Visit: Payer: Self-pay

## 2024-12-14 ENCOUNTER — Encounter: Payer: Self-pay | Admitting: Family Medicine

## 2024-12-14 DIAGNOSIS — Z122 Encounter for screening for malignant neoplasm of respiratory organs: Secondary | ICD-10-CM

## 2024-12-14 DIAGNOSIS — Z87891 Personal history of nicotine dependence: Secondary | ICD-10-CM

## 2024-12-14 DIAGNOSIS — F17210 Nicotine dependence, cigarettes, uncomplicated: Secondary | ICD-10-CM

## 2024-12-14 DIAGNOSIS — Z1211 Encounter for screening for malignant neoplasm of colon: Secondary | ICD-10-CM | POA: Insufficient documentation

## 2024-12-14 LAB — CMP14+EGFR
ALT: 15 [IU]/L (ref 0–32)
AST: 20 [IU]/L (ref 0–40)
Albumin: 4.3 g/dL (ref 3.9–4.9)
Alkaline Phosphatase: 82 [IU]/L (ref 49–135)
BUN/Creatinine Ratio: 14 (ref 12–28)
BUN: 15 mg/dL (ref 8–27)
Bilirubin Total: 0.5 mg/dL (ref 0.0–1.2)
CO2: 24 mmol/L (ref 20–29)
Calcium: 9.7 mg/dL (ref 8.7–10.3)
Chloride: 104 mmol/L (ref 96–106)
Creatinine, Ser: 1.06 mg/dL — ABNORMAL HIGH (ref 0.57–1.00)
Globulin, Total: 3 g/dL (ref 1.5–4.5)
Glucose: 98 mg/dL (ref 70–99)
Potassium: 4.5 mmol/L (ref 3.5–5.2)
Sodium: 140 mmol/L (ref 134–144)
Total Protein: 7.3 g/dL (ref 6.0–8.5)
eGFR: 58 mL/min/{1.73_m2} — ABNORMAL LOW

## 2024-12-14 LAB — CBC WITH DIFFERENTIAL/PLATELET
Basophils Absolute: 0 10*3/uL (ref 0.0–0.2)
Basos: 0 %
EOS (ABSOLUTE): 0.2 10*3/uL (ref 0.0–0.4)
Eos: 3 %
Hematocrit: 46.1 % (ref 34.0–46.6)
Hemoglobin: 15 g/dL (ref 11.1–15.9)
Immature Grans (Abs): 0 10*3/uL (ref 0.0–0.1)
Immature Granulocytes: 0 %
Lymphocytes Absolute: 2 10*3/uL (ref 0.7–3.1)
Lymphs: 32 %
MCH: 31.1 pg (ref 26.6–33.0)
MCHC: 32.5 g/dL (ref 31.5–35.7)
MCV: 96 fL (ref 79–97)
Monocytes Absolute: 0.5 10*3/uL (ref 0.1–0.9)
Monocytes: 8 %
Neutrophils Absolute: 3.6 10*3/uL (ref 1.4–7.0)
Neutrophils: 57 %
Platelets: 270 10*3/uL (ref 150–450)
RBC: 4.82 x10E6/uL (ref 3.77–5.28)
RDW: 12.5 % (ref 11.7–15.4)
WBC: 6.2 10*3/uL (ref 3.4–10.8)

## 2024-12-14 LAB — HEMOGLOBIN A1C
Est. average glucose Bld gHb Est-mCnc: 120 mg/dL
Hgb A1c MFr Bld: 5.8 % — ABNORMAL HIGH (ref 4.8–5.6)

## 2024-12-14 LAB — LIPID PANEL
Chol/HDL Ratio: 4.5 ratio — ABNORMAL HIGH (ref 0.0–4.4)
Cholesterol, Total: 206 mg/dL — ABNORMAL HIGH (ref 100–199)
HDL: 46 mg/dL
LDL Chol Calc (NIH): 143 mg/dL — ABNORMAL HIGH (ref 0–99)
Triglycerides: 92 mg/dL (ref 0–149)
VLDL Cholesterol Cal: 17 mg/dL (ref 5–40)

## 2024-12-14 LAB — VITAMIN D 25 HYDROXY (VIT D DEFICIENCY, FRACTURES): Vit D, 25-Hydroxy: 30.6 ng/mL (ref 30.0–100.0)

## 2024-12-14 LAB — TSH: TSH: 1.97 u[IU]/mL (ref 0.450–4.500)

## 2024-12-14 NOTE — Assessment & Plan Note (Signed)
 Uncontrolled needs additional med that was already prescribed but she states was unaware DASH diet and commitment to daily physical activity for a minimum of 30 minutes discussed and encouraged, as a part of hypertension management. The importance of attaining a healthy weight is also discussed.     12/13/2024    1:05 PM 05/05/2024   10:58 AM 01/04/2024    9:27 AM 09/15/2023    9:52 AM 07/02/2023    2:44 PM 07/02/2023    2:39 PM 07/02/2023    1:43 PM  BP/Weight  Systolic BP 140 135 131 139 148 148 130  Diastolic BP 86 82 84 85 92 92 83  Wt. (Lbs) 205 208.12 210.04 203.12   200  BMI 33.59 kg/m2 34.11 kg/m2 36.05 kg/m2 34.87 kg/m2   34.33 kg/m2

## 2024-12-14 NOTE — Assessment & Plan Note (Signed)
 Past due and has h/o tubiular adenoma, refer for colonscopy, overwhelmed with life circumstances and has not followed through to date but intends to

## 2024-12-14 NOTE — Assessment & Plan Note (Signed)
 Intermittent symptoms of heartburn depending on food eaten, remain on daily PPI and GI re eval

## 2024-12-14 NOTE — Assessment & Plan Note (Signed)
 Asked:confirms currently smokes cigarettes / day x 2 months, stress of several family members the trigger Assess: Unwilling to set a quit date, but wants to  cut back and to quit Advise: needs to QUIT to reduce risk of cancer, cardio and cerebrovascular disease Assist: counseled for 5 minutes zyban  prescribed Arrange: follow up in 2 to 4 months

## 2024-12-14 NOTE — Assessment & Plan Note (Signed)
 Hyperlipidemia:Low fat diet discussed and encouraged.   Lipid Panel  Lab Results  Component Value Date   CHOL 206 (H) 12/13/2024   HDL 46 12/13/2024   LDLCALC 143 (H) 12/13/2024   TRIG 92 12/13/2024   CHOLHDL 4.5 (H) 12/13/2024     Need to verify if she has been diligent with current med if so then dose increase

## 2024-12-14 NOTE — Assessment & Plan Note (Signed)
 In past 3 months, spouse had surgery for brain aneurysm, grandson had asthma emergency and had to be airlifted to hospital, brother in law passed unexpectedly andhas chronic neurologic illness in her daughter, overwhelmed, poor sleep, no interest in therapy currently Start buspar  twice daily if needed , certainly at bedtime

## 2024-12-14 NOTE — Assessment & Plan Note (Signed)
 F/h of premature CAD, nicotine  use , hyperlipid HTN , obesity, refer for coronary calcium  screen

## 2024-12-19 MED ORDER — ROSUVASTATIN CALCIUM 20 MG PO TABS: 20.0000 mg | ORAL_TABLET | Freq: Every day | ORAL | 2 refills | Status: AC

## 2025-01-17 ENCOUNTER — Ambulatory Visit: Payer: Self-pay

## 2025-01-23 ENCOUNTER — Other Ambulatory Visit (HOSPITAL_COMMUNITY)

## 2025-02-14 ENCOUNTER — Ambulatory Visit: Payer: Self-pay | Admitting: Family Medicine
